# Patient Record
Sex: Male | Born: 1950 | Race: White | Hispanic: No | Marital: Married | Smoking: Never smoker
Health system: Southern US, Community
[De-identification: ages and names within clinical notes are randomized; demographics above are authoritative.]

## PROBLEM LIST (undated history)

## (undated) DIAGNOSIS — G4733 Obstructive sleep apnea (adult) (pediatric): Secondary | ICD-10-CM

## (undated) DIAGNOSIS — I4891 Unspecified atrial fibrillation: Secondary | ICD-10-CM

## (undated) DIAGNOSIS — I4892 Unspecified atrial flutter: Secondary | ICD-10-CM

## (undated) DIAGNOSIS — I499 Cardiac arrhythmia, unspecified: Secondary | ICD-10-CM

## (undated) DIAGNOSIS — I509 Heart failure, unspecified: Secondary | ICD-10-CM

## (undated) DIAGNOSIS — Z9989 Dependence on other enabling machines and devices: Secondary | ICD-10-CM

## (undated) DIAGNOSIS — E785 Hyperlipidemia, unspecified: Secondary | ICD-10-CM

## (undated) DIAGNOSIS — I10 Essential (primary) hypertension: Secondary | ICD-10-CM

## (undated) DIAGNOSIS — J309 Allergic rhinitis, unspecified: Secondary | ICD-10-CM

## (undated) DIAGNOSIS — E119 Type 2 diabetes mellitus without complications: Secondary | ICD-10-CM

## (undated) HISTORY — DX: Cardiac arrhythmia, unspecified: I49.9

## (undated) HISTORY — DX: Heart failure, unspecified: I50.9

## (undated) HISTORY — DX: Unspecified atrial flutter: I48.92

## (undated) HISTORY — DX: Unspecified atrial fibrillation: I48.91

## (undated) HISTORY — DX: Essential (primary) hypertension: I10

## (undated) HISTORY — DX: Type 2 diabetes mellitus without complications: E11.9

## (undated) HISTORY — DX: Allergic rhinitis, unspecified: J30.9

## (undated) HISTORY — DX: Hyperlipidemia, unspecified: E78.5

## (undated) HISTORY — PX: CARDIAC CATHETERIZATION: SHX172

## (undated) HISTORY — DX: Obstructive sleep apnea (adult) (pediatric): G47.33

## (undated) HISTORY — PX: KNEE SURGERY: SHX244

## (undated) HISTORY — PX: PILONIDAL CYST / SINUS EXCISION: SUR543

---

## 2014-11-02 DIAGNOSIS — R5383 Other fatigue: Secondary | ICD-10-CM | POA: Diagnosis not present

## 2014-11-02 DIAGNOSIS — R5381 Other malaise: Secondary | ICD-10-CM | POA: Diagnosis not present

## 2014-11-02 DIAGNOSIS — D649 Anemia, unspecified: Secondary | ICD-10-CM | POA: Diagnosis not present

## 2014-11-02 DIAGNOSIS — E291 Testicular hypofunction: Secondary | ICD-10-CM | POA: Diagnosis not present

## 2014-11-09 DIAGNOSIS — R5381 Other malaise: Secondary | ICD-10-CM | POA: Diagnosis not present

## 2014-11-09 DIAGNOSIS — R030 Elevated blood-pressure reading, without diagnosis of hypertension: Secondary | ICD-10-CM | POA: Diagnosis not present

## 2014-11-09 DIAGNOSIS — E291 Testicular hypofunction: Secondary | ICD-10-CM | POA: Diagnosis not present

## 2014-11-09 DIAGNOSIS — R5383 Other fatigue: Secondary | ICD-10-CM | POA: Diagnosis not present

## 2014-11-16 DIAGNOSIS — E291 Testicular hypofunction: Secondary | ICD-10-CM | POA: Diagnosis not present

## 2014-11-16 DIAGNOSIS — R5381 Other malaise: Secondary | ICD-10-CM | POA: Diagnosis not present

## 2014-11-16 DIAGNOSIS — R5383 Other fatigue: Secondary | ICD-10-CM | POA: Diagnosis not present

## 2014-11-23 DIAGNOSIS — N529 Male erectile dysfunction, unspecified: Secondary | ICD-10-CM | POA: Diagnosis not present

## 2014-11-23 DIAGNOSIS — R5383 Other fatigue: Secondary | ICD-10-CM | POA: Diagnosis not present

## 2014-11-23 DIAGNOSIS — E291 Testicular hypofunction: Secondary | ICD-10-CM | POA: Diagnosis not present

## 2014-11-23 DIAGNOSIS — G4733 Obstructive sleep apnea (adult) (pediatric): Secondary | ICD-10-CM | POA: Diagnosis not present

## 2014-11-23 DIAGNOSIS — R5381 Other malaise: Secondary | ICD-10-CM | POA: Diagnosis not present

## 2014-11-23 DIAGNOSIS — R6882 Decreased libido: Secondary | ICD-10-CM | POA: Diagnosis not present

## 2014-11-23 DIAGNOSIS — D649 Anemia, unspecified: Secondary | ICD-10-CM | POA: Diagnosis not present

## 2014-11-23 DIAGNOSIS — I10 Essential (primary) hypertension: Secondary | ICD-10-CM | POA: Diagnosis not present

## 2014-11-23 DIAGNOSIS — E611 Iron deficiency: Secondary | ICD-10-CM | POA: Diagnosis not present

## 2014-11-30 DIAGNOSIS — Z79899 Other long term (current) drug therapy: Secondary | ICD-10-CM | POA: Diagnosis not present

## 2014-11-30 DIAGNOSIS — N429 Disorder of prostate, unspecified: Secondary | ICD-10-CM | POA: Diagnosis not present

## 2014-11-30 DIAGNOSIS — R5381 Other malaise: Secondary | ICD-10-CM | POA: Diagnosis not present

## 2014-11-30 DIAGNOSIS — G4733 Obstructive sleep apnea (adult) (pediatric): Secondary | ICD-10-CM | POA: Diagnosis not present

## 2014-11-30 DIAGNOSIS — E611 Iron deficiency: Secondary | ICD-10-CM | POA: Diagnosis not present

## 2014-11-30 DIAGNOSIS — R5383 Other fatigue: Secondary | ICD-10-CM | POA: Diagnosis not present

## 2014-11-30 DIAGNOSIS — E291 Testicular hypofunction: Secondary | ICD-10-CM | POA: Diagnosis not present

## 2014-12-07 DIAGNOSIS — N429 Disorder of prostate, unspecified: Secondary | ICD-10-CM | POA: Diagnosis not present

## 2014-12-07 DIAGNOSIS — G4733 Obstructive sleep apnea (adult) (pediatric): Secondary | ICD-10-CM | POA: Diagnosis not present

## 2014-12-07 DIAGNOSIS — E291 Testicular hypofunction: Secondary | ICD-10-CM | POA: Diagnosis not present

## 2014-12-07 DIAGNOSIS — R947 Abnormal results of other endocrine function studies: Secondary | ICD-10-CM | POA: Diagnosis not present

## 2014-12-07 DIAGNOSIS — R5383 Other fatigue: Secondary | ICD-10-CM | POA: Diagnosis not present

## 2014-12-07 DIAGNOSIS — N529 Male erectile dysfunction, unspecified: Secondary | ICD-10-CM | POA: Diagnosis not present

## 2014-12-07 DIAGNOSIS — R5381 Other malaise: Secondary | ICD-10-CM | POA: Diagnosis not present

## 2014-12-14 DIAGNOSIS — R5381 Other malaise: Secondary | ICD-10-CM | POA: Diagnosis not present

## 2014-12-14 DIAGNOSIS — G2581 Restless legs syndrome: Secondary | ICD-10-CM | POA: Diagnosis not present

## 2014-12-14 DIAGNOSIS — G4700 Insomnia, unspecified: Secondary | ICD-10-CM | POA: Diagnosis not present

## 2014-12-14 DIAGNOSIS — E291 Testicular hypofunction: Secondary | ICD-10-CM | POA: Diagnosis not present

## 2014-12-14 DIAGNOSIS — G4733 Obstructive sleep apnea (adult) (pediatric): Secondary | ICD-10-CM | POA: Diagnosis not present

## 2014-12-14 DIAGNOSIS — R947 Abnormal results of other endocrine function studies: Secondary | ICD-10-CM | POA: Diagnosis not present

## 2014-12-14 DIAGNOSIS — R5383 Other fatigue: Secondary | ICD-10-CM | POA: Diagnosis not present

## 2014-12-14 DIAGNOSIS — I10 Essential (primary) hypertension: Secondary | ICD-10-CM | POA: Diagnosis not present

## 2014-12-22 DIAGNOSIS — R5381 Other malaise: Secondary | ICD-10-CM | POA: Diagnosis not present

## 2014-12-22 DIAGNOSIS — R5383 Other fatigue: Secondary | ICD-10-CM | POA: Diagnosis not present

## 2014-12-22 DIAGNOSIS — I10 Essential (primary) hypertension: Secondary | ICD-10-CM | POA: Diagnosis not present

## 2014-12-22 DIAGNOSIS — R947 Abnormal results of other endocrine function studies: Secondary | ICD-10-CM | POA: Diagnosis not present

## 2014-12-22 DIAGNOSIS — E039 Hypothyroidism, unspecified: Secondary | ICD-10-CM | POA: Diagnosis not present

## 2014-12-22 DIAGNOSIS — E291 Testicular hypofunction: Secondary | ICD-10-CM | POA: Diagnosis not present

## 2014-12-29 DIAGNOSIS — I10 Essential (primary) hypertension: Secondary | ICD-10-CM | POA: Diagnosis not present

## 2014-12-29 DIAGNOSIS — G4733 Obstructive sleep apnea (adult) (pediatric): Secondary | ICD-10-CM | POA: Diagnosis not present

## 2014-12-29 DIAGNOSIS — R947 Abnormal results of other endocrine function studies: Secondary | ICD-10-CM | POA: Diagnosis not present

## 2014-12-29 DIAGNOSIS — E291 Testicular hypofunction: Secondary | ICD-10-CM | POA: Diagnosis not present

## 2014-12-29 DIAGNOSIS — R5381 Other malaise: Secondary | ICD-10-CM | POA: Diagnosis not present

## 2015-01-05 DIAGNOSIS — E291 Testicular hypofunction: Secondary | ICD-10-CM | POA: Diagnosis not present

## 2015-01-05 DIAGNOSIS — R5381 Other malaise: Secondary | ICD-10-CM | POA: Diagnosis not present

## 2015-01-05 DIAGNOSIS — R947 Abnormal results of other endocrine function studies: Secondary | ICD-10-CM | POA: Diagnosis not present

## 2015-01-12 DIAGNOSIS — R5381 Other malaise: Secondary | ICD-10-CM | POA: Diagnosis not present

## 2015-01-12 DIAGNOSIS — E291 Testicular hypofunction: Secondary | ICD-10-CM | POA: Diagnosis not present

## 2015-01-12 DIAGNOSIS — R947 Abnormal results of other endocrine function studies: Secondary | ICD-10-CM | POA: Diagnosis not present

## 2015-01-12 DIAGNOSIS — G4733 Obstructive sleep apnea (adult) (pediatric): Secondary | ICD-10-CM | POA: Diagnosis not present

## 2015-01-26 DIAGNOSIS — R947 Abnormal results of other endocrine function studies: Secondary | ICD-10-CM | POA: Diagnosis not present

## 2015-01-26 DIAGNOSIS — E291 Testicular hypofunction: Secondary | ICD-10-CM | POA: Diagnosis not present

## 2015-01-26 DIAGNOSIS — R5381 Other malaise: Secondary | ICD-10-CM | POA: Diagnosis not present

## 2015-02-02 DIAGNOSIS — R5381 Other malaise: Secondary | ICD-10-CM | POA: Diagnosis not present

## 2015-02-02 DIAGNOSIS — E291 Testicular hypofunction: Secondary | ICD-10-CM | POA: Diagnosis not present

## 2015-02-02 DIAGNOSIS — R947 Abnormal results of other endocrine function studies: Secondary | ICD-10-CM | POA: Diagnosis not present

## 2015-02-02 DIAGNOSIS — R5383 Other fatigue: Secondary | ICD-10-CM | POA: Diagnosis not present

## 2015-02-09 DIAGNOSIS — R5383 Other fatigue: Secondary | ICD-10-CM | POA: Diagnosis not present

## 2015-02-09 DIAGNOSIS — R947 Abnormal results of other endocrine function studies: Secondary | ICD-10-CM | POA: Diagnosis not present

## 2015-02-09 DIAGNOSIS — R5381 Other malaise: Secondary | ICD-10-CM | POA: Diagnosis not present

## 2015-02-09 DIAGNOSIS — E291 Testicular hypofunction: Secondary | ICD-10-CM | POA: Diagnosis not present

## 2015-02-15 DIAGNOSIS — J309 Allergic rhinitis, unspecified: Secondary | ICD-10-CM | POA: Diagnosis not present

## 2015-02-15 DIAGNOSIS — J019 Acute sinusitis, unspecified: Secondary | ICD-10-CM | POA: Diagnosis not present

## 2015-02-16 DIAGNOSIS — R5383 Other fatigue: Secondary | ICD-10-CM | POA: Diagnosis not present

## 2015-02-16 DIAGNOSIS — E291 Testicular hypofunction: Secondary | ICD-10-CM | POA: Diagnosis not present

## 2015-02-16 DIAGNOSIS — R5381 Other malaise: Secondary | ICD-10-CM | POA: Diagnosis not present

## 2015-02-16 DIAGNOSIS — R947 Abnormal results of other endocrine function studies: Secondary | ICD-10-CM | POA: Diagnosis not present

## 2015-02-21 DIAGNOSIS — J019 Acute sinusitis, unspecified: Secondary | ICD-10-CM | POA: Diagnosis not present

## 2015-03-02 DIAGNOSIS — R947 Abnormal results of other endocrine function studies: Secondary | ICD-10-CM | POA: Diagnosis not present

## 2015-03-02 DIAGNOSIS — R5383 Other fatigue: Secondary | ICD-10-CM | POA: Diagnosis not present

## 2015-03-02 DIAGNOSIS — E291 Testicular hypofunction: Secondary | ICD-10-CM | POA: Diagnosis not present

## 2015-03-02 DIAGNOSIS — R5381 Other malaise: Secondary | ICD-10-CM | POA: Diagnosis not present

## 2015-03-03 DIAGNOSIS — Z8719 Personal history of other diseases of the digestive system: Secondary | ICD-10-CM | POA: Diagnosis not present

## 2015-03-03 DIAGNOSIS — K210 Gastro-esophageal reflux disease with esophagitis: Secondary | ICD-10-CM | POA: Diagnosis not present

## 2015-03-09 DIAGNOSIS — R5381 Other malaise: Secondary | ICD-10-CM | POA: Diagnosis not present

## 2015-03-09 DIAGNOSIS — R947 Abnormal results of other endocrine function studies: Secondary | ICD-10-CM | POA: Diagnosis not present

## 2015-03-09 DIAGNOSIS — R361 Hematospermia: Secondary | ICD-10-CM | POA: Diagnosis not present

## 2015-03-09 DIAGNOSIS — R5383 Other fatigue: Secondary | ICD-10-CM | POA: Diagnosis not present

## 2015-03-09 DIAGNOSIS — E291 Testicular hypofunction: Secondary | ICD-10-CM | POA: Diagnosis not present

## 2015-03-16 DIAGNOSIS — Z79899 Other long term (current) drug therapy: Secondary | ICD-10-CM | POA: Diagnosis not present

## 2015-03-16 DIAGNOSIS — R5381 Other malaise: Secondary | ICD-10-CM | POA: Diagnosis not present

## 2015-03-16 DIAGNOSIS — R5383 Other fatigue: Secondary | ICD-10-CM | POA: Diagnosis not present

## 2015-03-16 DIAGNOSIS — E291 Testicular hypofunction: Secondary | ICD-10-CM | POA: Diagnosis not present

## 2015-03-16 DIAGNOSIS — R947 Abnormal results of other endocrine function studies: Secondary | ICD-10-CM | POA: Diagnosis not present

## 2015-03-23 DIAGNOSIS — R5383 Other fatigue: Secondary | ICD-10-CM | POA: Diagnosis not present

## 2015-03-23 DIAGNOSIS — R947 Abnormal results of other endocrine function studies: Secondary | ICD-10-CM | POA: Diagnosis not present

## 2015-03-23 DIAGNOSIS — R5381 Other malaise: Secondary | ICD-10-CM | POA: Diagnosis not present

## 2015-03-23 DIAGNOSIS — R361 Hematospermia: Secondary | ICD-10-CM | POA: Diagnosis not present

## 2015-03-23 DIAGNOSIS — E291 Testicular hypofunction: Secondary | ICD-10-CM | POA: Diagnosis not present

## 2015-03-25 DIAGNOSIS — N528 Other male erectile dysfunction: Secondary | ICD-10-CM | POA: Diagnosis not present

## 2015-03-25 DIAGNOSIS — E291 Testicular hypofunction: Secondary | ICD-10-CM | POA: Diagnosis not present

## 2015-03-25 DIAGNOSIS — N401 Enlarged prostate with lower urinary tract symptoms: Secondary | ICD-10-CM | POA: Diagnosis not present

## 2015-03-25 DIAGNOSIS — R361 Hematospermia: Secondary | ICD-10-CM | POA: Diagnosis not present

## 2015-03-30 DIAGNOSIS — R947 Abnormal results of other endocrine function studies: Secondary | ICD-10-CM | POA: Diagnosis not present

## 2015-03-30 DIAGNOSIS — I10 Essential (primary) hypertension: Secondary | ICD-10-CM | POA: Diagnosis not present

## 2015-03-30 DIAGNOSIS — R5381 Other malaise: Secondary | ICD-10-CM | POA: Diagnosis not present

## 2015-03-30 DIAGNOSIS — E291 Testicular hypofunction: Secondary | ICD-10-CM | POA: Diagnosis not present

## 2015-03-30 DIAGNOSIS — R5383 Other fatigue: Secondary | ICD-10-CM | POA: Diagnosis not present

## 2015-04-06 DIAGNOSIS — R5381 Other malaise: Secondary | ICD-10-CM | POA: Diagnosis not present

## 2015-04-06 DIAGNOSIS — R947 Abnormal results of other endocrine function studies: Secondary | ICD-10-CM | POA: Diagnosis not present

## 2015-04-06 DIAGNOSIS — R5383 Other fatigue: Secondary | ICD-10-CM | POA: Diagnosis not present

## 2015-04-06 DIAGNOSIS — E291 Testicular hypofunction: Secondary | ICD-10-CM | POA: Diagnosis not present

## 2015-04-21 DIAGNOSIS — E291 Testicular hypofunction: Secondary | ICD-10-CM | POA: Diagnosis not present

## 2015-04-21 DIAGNOSIS — R947 Abnormal results of other endocrine function studies: Secondary | ICD-10-CM | POA: Diagnosis not present

## 2015-04-21 DIAGNOSIS — I10 Essential (primary) hypertension: Secondary | ICD-10-CM | POA: Diagnosis not present

## 2015-04-21 DIAGNOSIS — R5381 Other malaise: Secondary | ICD-10-CM | POA: Diagnosis not present

## 2015-04-21 DIAGNOSIS — R5383 Other fatigue: Secondary | ICD-10-CM | POA: Diagnosis not present

## 2015-04-27 DIAGNOSIS — I10 Essential (primary) hypertension: Secondary | ICD-10-CM | POA: Diagnosis not present

## 2015-04-27 DIAGNOSIS — M79672 Pain in left foot: Secondary | ICD-10-CM | POA: Diagnosis not present

## 2015-04-27 DIAGNOSIS — E611 Iron deficiency: Secondary | ICD-10-CM | POA: Diagnosis not present

## 2015-04-27 DIAGNOSIS — E291 Testicular hypofunction: Secondary | ICD-10-CM | POA: Diagnosis not present

## 2015-04-27 DIAGNOSIS — R5381 Other malaise: Secondary | ICD-10-CM | POA: Diagnosis not present

## 2015-04-27 DIAGNOSIS — M109 Gout, unspecified: Secondary | ICD-10-CM | POA: Diagnosis not present

## 2015-04-27 DIAGNOSIS — R947 Abnormal results of other endocrine function studies: Secondary | ICD-10-CM | POA: Diagnosis not present

## 2015-04-27 DIAGNOSIS — R5383 Other fatigue: Secondary | ICD-10-CM | POA: Diagnosis not present

## 2015-04-27 DIAGNOSIS — L089 Local infection of the skin and subcutaneous tissue, unspecified: Secondary | ICD-10-CM | POA: Diagnosis not present

## 2015-05-05 DIAGNOSIS — I10 Essential (primary) hypertension: Secondary | ICD-10-CM | POA: Diagnosis not present

## 2015-05-05 DIAGNOSIS — R5381 Other malaise: Secondary | ICD-10-CM | POA: Diagnosis not present

## 2015-05-05 DIAGNOSIS — R5383 Other fatigue: Secondary | ICD-10-CM | POA: Diagnosis not present

## 2015-05-05 DIAGNOSIS — E291 Testicular hypofunction: Secondary | ICD-10-CM | POA: Diagnosis not present

## 2015-05-05 DIAGNOSIS — R947 Abnormal results of other endocrine function studies: Secondary | ICD-10-CM | POA: Diagnosis not present

## 2015-05-10 DIAGNOSIS — J019 Acute sinusitis, unspecified: Secondary | ICD-10-CM | POA: Diagnosis not present

## 2015-05-16 DIAGNOSIS — J209 Acute bronchitis, unspecified: Secondary | ICD-10-CM | POA: Diagnosis not present

## 2015-05-16 DIAGNOSIS — J22 Unspecified acute lower respiratory infection: Secondary | ICD-10-CM | POA: Diagnosis not present

## 2015-05-19 DIAGNOSIS — R5381 Other malaise: Secondary | ICD-10-CM | POA: Diagnosis not present

## 2015-05-19 DIAGNOSIS — R5383 Other fatigue: Secondary | ICD-10-CM | POA: Diagnosis not present

## 2015-05-19 DIAGNOSIS — R947 Abnormal results of other endocrine function studies: Secondary | ICD-10-CM | POA: Diagnosis not present

## 2015-05-19 DIAGNOSIS — E291 Testicular hypofunction: Secondary | ICD-10-CM | POA: Diagnosis not present

## 2015-05-24 DIAGNOSIS — E785 Hyperlipidemia, unspecified: Secondary | ICD-10-CM | POA: Diagnosis not present

## 2015-05-24 DIAGNOSIS — R0600 Dyspnea, unspecified: Secondary | ICD-10-CM | POA: Diagnosis not present

## 2015-05-24 DIAGNOSIS — E119 Type 2 diabetes mellitus without complications: Secondary | ICD-10-CM | POA: Diagnosis not present

## 2015-05-24 DIAGNOSIS — E559 Vitamin D deficiency, unspecified: Secondary | ICD-10-CM | POA: Diagnosis not present

## 2015-05-24 DIAGNOSIS — R0902 Hypoxemia: Secondary | ICD-10-CM | POA: Diagnosis not present

## 2015-05-24 DIAGNOSIS — J9811 Atelectasis: Secondary | ICD-10-CM | POA: Diagnosis not present

## 2015-05-26 DIAGNOSIS — R5383 Other fatigue: Secondary | ICD-10-CM | POA: Diagnosis not present

## 2015-05-26 DIAGNOSIS — R5381 Other malaise: Secondary | ICD-10-CM | POA: Diagnosis not present

## 2015-05-26 DIAGNOSIS — R947 Abnormal results of other endocrine function studies: Secondary | ICD-10-CM | POA: Diagnosis not present

## 2015-05-26 DIAGNOSIS — E119 Type 2 diabetes mellitus without complications: Secondary | ICD-10-CM | POA: Diagnosis not present

## 2015-05-26 DIAGNOSIS — E291 Testicular hypofunction: Secondary | ICD-10-CM | POA: Diagnosis not present

## 2015-05-27 DIAGNOSIS — H2513 Age-related nuclear cataract, bilateral: Secondary | ICD-10-CM | POA: Diagnosis not present

## 2015-05-27 DIAGNOSIS — E11329 Type 2 diabetes mellitus with mild nonproliferative diabetic retinopathy without macular edema: Secondary | ICD-10-CM | POA: Diagnosis not present

## 2015-05-27 DIAGNOSIS — Z0100 Encounter for examination of eyes and vision without abnormal findings: Secondary | ICD-10-CM | POA: Diagnosis not present

## 2015-06-02 DIAGNOSIS — R5383 Other fatigue: Secondary | ICD-10-CM | POA: Diagnosis not present

## 2015-06-02 DIAGNOSIS — R5381 Other malaise: Secondary | ICD-10-CM | POA: Diagnosis not present

## 2015-06-02 DIAGNOSIS — R947 Abnormal results of other endocrine function studies: Secondary | ICD-10-CM | POA: Diagnosis not present

## 2015-06-02 DIAGNOSIS — I10 Essential (primary) hypertension: Secondary | ICD-10-CM | POA: Diagnosis not present

## 2015-06-02 DIAGNOSIS — E291 Testicular hypofunction: Secondary | ICD-10-CM | POA: Diagnosis not present

## 2015-06-09 DIAGNOSIS — R947 Abnormal results of other endocrine function studies: Secondary | ICD-10-CM | POA: Diagnosis not present

## 2015-06-09 DIAGNOSIS — R5383 Other fatigue: Secondary | ICD-10-CM | POA: Diagnosis not present

## 2015-06-09 DIAGNOSIS — R5381 Other malaise: Secondary | ICD-10-CM | POA: Diagnosis not present

## 2015-06-09 DIAGNOSIS — E291 Testicular hypofunction: Secondary | ICD-10-CM | POA: Diagnosis not present

## 2015-06-16 DIAGNOSIS — I10 Essential (primary) hypertension: Secondary | ICD-10-CM | POA: Diagnosis not present

## 2015-06-16 DIAGNOSIS — R947 Abnormal results of other endocrine function studies: Secondary | ICD-10-CM | POA: Diagnosis not present

## 2015-06-16 DIAGNOSIS — R5381 Other malaise: Secondary | ICD-10-CM | POA: Diagnosis not present

## 2015-06-16 DIAGNOSIS — E291 Testicular hypofunction: Secondary | ICD-10-CM | POA: Diagnosis not present

## 2015-06-16 DIAGNOSIS — R5383 Other fatigue: Secondary | ICD-10-CM | POA: Diagnosis not present

## 2015-06-23 DIAGNOSIS — E611 Iron deficiency: Secondary | ICD-10-CM | POA: Diagnosis not present

## 2015-06-23 DIAGNOSIS — I10 Essential (primary) hypertension: Secondary | ICD-10-CM | POA: Diagnosis not present

## 2015-06-23 DIAGNOSIS — E291 Testicular hypofunction: Secondary | ICD-10-CM | POA: Diagnosis not present

## 2015-06-23 DIAGNOSIS — R5383 Other fatigue: Secondary | ICD-10-CM | POA: Diagnosis not present

## 2015-06-23 DIAGNOSIS — R5381 Other malaise: Secondary | ICD-10-CM | POA: Diagnosis not present

## 2015-06-23 DIAGNOSIS — G4733 Obstructive sleep apnea (adult) (pediatric): Secondary | ICD-10-CM | POA: Diagnosis not present

## 2015-06-30 DIAGNOSIS — R947 Abnormal results of other endocrine function studies: Secondary | ICD-10-CM | POA: Diagnosis not present

## 2015-06-30 DIAGNOSIS — E291 Testicular hypofunction: Secondary | ICD-10-CM | POA: Diagnosis not present

## 2015-06-30 DIAGNOSIS — R5383 Other fatigue: Secondary | ICD-10-CM | POA: Diagnosis not present

## 2015-06-30 DIAGNOSIS — R5381 Other malaise: Secondary | ICD-10-CM | POA: Diagnosis not present

## 2015-07-07 DIAGNOSIS — R5383 Other fatigue: Secondary | ICD-10-CM | POA: Diagnosis not present

## 2015-07-07 DIAGNOSIS — R947 Abnormal results of other endocrine function studies: Secondary | ICD-10-CM | POA: Diagnosis not present

## 2015-07-07 DIAGNOSIS — R030 Elevated blood-pressure reading, without diagnosis of hypertension: Secondary | ICD-10-CM | POA: Diagnosis not present

## 2015-07-07 DIAGNOSIS — E291 Testicular hypofunction: Secondary | ICD-10-CM | POA: Diagnosis not present

## 2015-07-07 DIAGNOSIS — R5381 Other malaise: Secondary | ICD-10-CM | POA: Diagnosis not present

## 2015-07-07 DIAGNOSIS — Z79899 Other long term (current) drug therapy: Secondary | ICD-10-CM | POA: Diagnosis not present

## 2015-07-14 DIAGNOSIS — R5381 Other malaise: Secondary | ICD-10-CM | POA: Diagnosis not present

## 2015-07-14 DIAGNOSIS — R5383 Other fatigue: Secondary | ICD-10-CM | POA: Diagnosis not present

## 2015-07-14 DIAGNOSIS — E291 Testicular hypofunction: Secondary | ICD-10-CM | POA: Diagnosis not present

## 2015-07-14 DIAGNOSIS — R778 Other specified abnormalities of plasma proteins: Secondary | ICD-10-CM | POA: Diagnosis not present

## 2015-07-14 DIAGNOSIS — R947 Abnormal results of other endocrine function studies: Secondary | ICD-10-CM | POA: Diagnosis not present

## 2015-07-20 DIAGNOSIS — R5383 Other fatigue: Secondary | ICD-10-CM | POA: Diagnosis not present

## 2015-07-20 DIAGNOSIS — E291 Testicular hypofunction: Secondary | ICD-10-CM | POA: Diagnosis not present

## 2015-07-20 DIAGNOSIS — R947 Abnormal results of other endocrine function studies: Secondary | ICD-10-CM | POA: Diagnosis not present

## 2015-07-20 DIAGNOSIS — R5381 Other malaise: Secondary | ICD-10-CM | POA: Diagnosis not present

## 2015-07-27 DIAGNOSIS — R5381 Other malaise: Secondary | ICD-10-CM | POA: Diagnosis not present

## 2015-07-27 DIAGNOSIS — R947 Abnormal results of other endocrine function studies: Secondary | ICD-10-CM | POA: Diagnosis not present

## 2015-07-27 DIAGNOSIS — E291 Testicular hypofunction: Secondary | ICD-10-CM | POA: Diagnosis not present

## 2015-07-27 DIAGNOSIS — R5383 Other fatigue: Secondary | ICD-10-CM | POA: Diagnosis not present

## 2015-08-11 DIAGNOSIS — E291 Testicular hypofunction: Secondary | ICD-10-CM | POA: Diagnosis not present

## 2015-08-11 DIAGNOSIS — R5383 Other fatigue: Secondary | ICD-10-CM | POA: Diagnosis not present

## 2015-08-11 DIAGNOSIS — R5381 Other malaise: Secondary | ICD-10-CM | POA: Diagnosis not present

## 2015-08-11 DIAGNOSIS — R947 Abnormal results of other endocrine function studies: Secondary | ICD-10-CM | POA: Diagnosis not present

## 2015-08-17 DIAGNOSIS — R947 Abnormal results of other endocrine function studies: Secondary | ICD-10-CM | POA: Diagnosis not present

## 2015-08-17 DIAGNOSIS — R5383 Other fatigue: Secondary | ICD-10-CM | POA: Diagnosis not present

## 2015-08-17 DIAGNOSIS — R5381 Other malaise: Secondary | ICD-10-CM | POA: Diagnosis not present

## 2015-08-17 DIAGNOSIS — E291 Testicular hypofunction: Secondary | ICD-10-CM | POA: Diagnosis not present

## 2015-08-22 DIAGNOSIS — Z23 Encounter for immunization: Secondary | ICD-10-CM | POA: Diagnosis not present

## 2015-08-24 DIAGNOSIS — I10 Essential (primary) hypertension: Secondary | ICD-10-CM | POA: Diagnosis not present

## 2015-08-24 DIAGNOSIS — R5381 Other malaise: Secondary | ICD-10-CM | POA: Diagnosis not present

## 2015-08-24 DIAGNOSIS — E291 Testicular hypofunction: Secondary | ICD-10-CM | POA: Diagnosis not present

## 2015-08-24 DIAGNOSIS — R5383 Other fatigue: Secondary | ICD-10-CM | POA: Diagnosis not present

## 2015-08-24 DIAGNOSIS — R947 Abnormal results of other endocrine function studies: Secondary | ICD-10-CM | POA: Diagnosis not present

## 2015-08-31 DIAGNOSIS — R5381 Other malaise: Secondary | ICD-10-CM | POA: Diagnosis not present

## 2015-08-31 DIAGNOSIS — Z79899 Other long term (current) drug therapy: Secondary | ICD-10-CM | POA: Diagnosis not present

## 2015-08-31 DIAGNOSIS — E291 Testicular hypofunction: Secondary | ICD-10-CM | POA: Diagnosis not present

## 2015-08-31 DIAGNOSIS — I10 Essential (primary) hypertension: Secondary | ICD-10-CM | POA: Diagnosis not present

## 2015-08-31 DIAGNOSIS — E119 Type 2 diabetes mellitus without complications: Secondary | ICD-10-CM | POA: Diagnosis not present

## 2015-08-31 DIAGNOSIS — R947 Abnormal results of other endocrine function studies: Secondary | ICD-10-CM | POA: Diagnosis not present

## 2015-08-31 DIAGNOSIS — R5383 Other fatigue: Secondary | ICD-10-CM | POA: Diagnosis not present

## 2015-09-01 DIAGNOSIS — G4700 Insomnia, unspecified: Secondary | ICD-10-CM | POA: Diagnosis not present

## 2015-09-01 DIAGNOSIS — G2581 Restless legs syndrome: Secondary | ICD-10-CM | POA: Diagnosis not present

## 2015-09-01 DIAGNOSIS — G4733 Obstructive sleep apnea (adult) (pediatric): Secondary | ICD-10-CM | POA: Diagnosis not present

## 2015-09-01 DIAGNOSIS — I10 Essential (primary) hypertension: Secondary | ICD-10-CM | POA: Diagnosis not present

## 2015-09-07 DIAGNOSIS — R947 Abnormal results of other endocrine function studies: Secondary | ICD-10-CM | POA: Diagnosis not present

## 2015-09-07 DIAGNOSIS — R5381 Other malaise: Secondary | ICD-10-CM | POA: Diagnosis not present

## 2015-09-07 DIAGNOSIS — E291 Testicular hypofunction: Secondary | ICD-10-CM | POA: Diagnosis not present

## 2015-09-07 DIAGNOSIS — R5383 Other fatigue: Secondary | ICD-10-CM | POA: Diagnosis not present

## 2015-09-12 DIAGNOSIS — R0600 Dyspnea, unspecified: Secondary | ICD-10-CM | POA: Diagnosis not present

## 2015-09-12 DIAGNOSIS — R0602 Shortness of breath: Secondary | ICD-10-CM | POA: Diagnosis not present

## 2015-09-14 DIAGNOSIS — R5383 Other fatigue: Secondary | ICD-10-CM | POA: Diagnosis not present

## 2015-09-14 DIAGNOSIS — E119 Type 2 diabetes mellitus without complications: Secondary | ICD-10-CM | POA: Diagnosis not present

## 2015-09-14 DIAGNOSIS — R947 Abnormal results of other endocrine function studies: Secondary | ICD-10-CM | POA: Diagnosis not present

## 2015-09-14 DIAGNOSIS — R5381 Other malaise: Secondary | ICD-10-CM | POA: Diagnosis not present

## 2015-09-14 DIAGNOSIS — E291 Testicular hypofunction: Secondary | ICD-10-CM | POA: Diagnosis not present

## 2015-09-28 DIAGNOSIS — R5381 Other malaise: Secondary | ICD-10-CM | POA: Diagnosis not present

## 2015-09-28 DIAGNOSIS — R947 Abnormal results of other endocrine function studies: Secondary | ICD-10-CM | POA: Diagnosis not present

## 2015-09-28 DIAGNOSIS — R5383 Other fatigue: Secondary | ICD-10-CM | POA: Diagnosis not present

## 2015-09-28 DIAGNOSIS — E291 Testicular hypofunction: Secondary | ICD-10-CM | POA: Diagnosis not present

## 2015-09-30 DIAGNOSIS — I10 Essential (primary) hypertension: Secondary | ICD-10-CM | POA: Diagnosis not present

## 2015-09-30 DIAGNOSIS — R600 Localized edema: Secondary | ICD-10-CM | POA: Diagnosis not present

## 2015-09-30 DIAGNOSIS — M25571 Pain in right ankle and joints of right foot: Secondary | ICD-10-CM | POA: Diagnosis not present

## 2015-09-30 DIAGNOSIS — R609 Edema, unspecified: Secondary | ICD-10-CM | POA: Diagnosis not present

## 2015-10-05 DIAGNOSIS — E291 Testicular hypofunction: Secondary | ICD-10-CM | POA: Diagnosis not present

## 2015-10-05 DIAGNOSIS — R5383 Other fatigue: Secondary | ICD-10-CM | POA: Diagnosis not present

## 2015-10-05 DIAGNOSIS — R947 Abnormal results of other endocrine function studies: Secondary | ICD-10-CM | POA: Diagnosis not present

## 2015-10-05 DIAGNOSIS — R5381 Other malaise: Secondary | ICD-10-CM | POA: Diagnosis not present

## 2015-10-07 DIAGNOSIS — R600 Localized edema: Secondary | ICD-10-CM | POA: Diagnosis not present

## 2015-10-07 DIAGNOSIS — I10 Essential (primary) hypertension: Secondary | ICD-10-CM | POA: Diagnosis not present

## 2015-10-14 DIAGNOSIS — E291 Testicular hypofunction: Secondary | ICD-10-CM | POA: Diagnosis not present

## 2015-10-14 DIAGNOSIS — R5383 Other fatigue: Secondary | ICD-10-CM | POA: Diagnosis not present

## 2015-10-14 DIAGNOSIS — Z79899 Other long term (current) drug therapy: Secondary | ICD-10-CM | POA: Diagnosis not present

## 2015-10-14 DIAGNOSIS — R947 Abnormal results of other endocrine function studies: Secondary | ICD-10-CM | POA: Diagnosis not present

## 2015-10-14 DIAGNOSIS — R030 Elevated blood-pressure reading, without diagnosis of hypertension: Secondary | ICD-10-CM | POA: Diagnosis not present

## 2015-10-14 DIAGNOSIS — I10 Essential (primary) hypertension: Secondary | ICD-10-CM | POA: Diagnosis not present

## 2015-10-14 DIAGNOSIS — R5381 Other malaise: Secondary | ICD-10-CM | POA: Diagnosis not present

## 2015-10-14 DIAGNOSIS — N429 Disorder of prostate, unspecified: Secondary | ICD-10-CM | POA: Diagnosis not present

## 2015-10-21 DIAGNOSIS — G4733 Obstructive sleep apnea (adult) (pediatric): Secondary | ICD-10-CM | POA: Diagnosis not present

## 2015-10-21 DIAGNOSIS — N429 Disorder of prostate, unspecified: Secondary | ICD-10-CM | POA: Diagnosis not present

## 2015-10-21 DIAGNOSIS — I10 Essential (primary) hypertension: Secondary | ICD-10-CM | POA: Diagnosis not present

## 2015-10-21 DIAGNOSIS — R5381 Other malaise: Secondary | ICD-10-CM | POA: Diagnosis not present

## 2015-10-21 DIAGNOSIS — E669 Obesity, unspecified: Secondary | ICD-10-CM | POA: Diagnosis not present

## 2015-10-21 DIAGNOSIS — E291 Testicular hypofunction: Secondary | ICD-10-CM | POA: Diagnosis not present

## 2015-10-21 DIAGNOSIS — E119 Type 2 diabetes mellitus without complications: Secondary | ICD-10-CM | POA: Diagnosis not present

## 2015-10-21 DIAGNOSIS — R947 Abnormal results of other endocrine function studies: Secondary | ICD-10-CM | POA: Diagnosis not present

## 2015-10-21 DIAGNOSIS — R778 Other specified abnormalities of plasma proteins: Secondary | ICD-10-CM | POA: Diagnosis not present

## 2015-10-21 DIAGNOSIS — R5383 Other fatigue: Secondary | ICD-10-CM | POA: Diagnosis not present

## 2015-10-27 DIAGNOSIS — R947 Abnormal results of other endocrine function studies: Secondary | ICD-10-CM | POA: Diagnosis not present

## 2015-10-27 DIAGNOSIS — E291 Testicular hypofunction: Secondary | ICD-10-CM | POA: Diagnosis not present

## 2015-10-27 DIAGNOSIS — R5383 Other fatigue: Secondary | ICD-10-CM | POA: Diagnosis not present

## 2015-10-27 DIAGNOSIS — R5381 Other malaise: Secondary | ICD-10-CM | POA: Diagnosis not present

## 2015-11-26 DIAGNOSIS — K591 Functional diarrhea: Secondary | ICD-10-CM | POA: Diagnosis not present

## 2015-12-01 DIAGNOSIS — R42 Dizziness and giddiness: Secondary | ICD-10-CM | POA: Diagnosis not present

## 2015-12-01 DIAGNOSIS — R4182 Altered mental status, unspecified: Secondary | ICD-10-CM | POA: Diagnosis not present

## 2015-12-28 DIAGNOSIS — E291 Testicular hypofunction: Secondary | ICD-10-CM | POA: Diagnosis not present

## 2015-12-28 DIAGNOSIS — K2270 Barrett's esophagus without dysplasia: Secondary | ICD-10-CM | POA: Diagnosis not present

## 2016-01-04 DIAGNOSIS — L989 Disorder of the skin and subcutaneous tissue, unspecified: Secondary | ICD-10-CM | POA: Diagnosis not present

## 2016-01-04 DIAGNOSIS — E291 Testicular hypofunction: Secondary | ICD-10-CM | POA: Diagnosis not present

## 2016-01-05 DIAGNOSIS — K219 Gastro-esophageal reflux disease without esophagitis: Secondary | ICD-10-CM | POA: Diagnosis not present

## 2016-01-05 DIAGNOSIS — L578 Other skin changes due to chronic exposure to nonionizing radiation: Secondary | ICD-10-CM | POA: Diagnosis not present

## 2016-01-05 DIAGNOSIS — L570 Actinic keratosis: Secondary | ICD-10-CM | POA: Diagnosis not present

## 2016-01-11 DIAGNOSIS — R5383 Other fatigue: Secondary | ICD-10-CM | POA: Diagnosis not present

## 2016-01-11 DIAGNOSIS — N5203 Combined arterial insufficiency and corporo-venous occlusive erectile dysfunction: Secondary | ICD-10-CM | POA: Diagnosis not present

## 2016-01-11 DIAGNOSIS — E291 Testicular hypofunction: Secondary | ICD-10-CM | POA: Diagnosis not present

## 2016-01-11 DIAGNOSIS — E1151 Type 2 diabetes mellitus with diabetic peripheral angiopathy without gangrene: Secondary | ICD-10-CM | POA: Diagnosis not present

## 2016-01-11 DIAGNOSIS — N401 Enlarged prostate with lower urinary tract symptoms: Secondary | ICD-10-CM | POA: Diagnosis not present

## 2016-01-11 DIAGNOSIS — N3000 Acute cystitis without hematuria: Secondary | ICD-10-CM | POA: Diagnosis not present

## 2016-01-18 DIAGNOSIS — D0421 Carcinoma in situ of skin of right ear and external auricular canal: Secondary | ICD-10-CM | POA: Diagnosis not present

## 2016-01-18 DIAGNOSIS — E291 Testicular hypofunction: Secondary | ICD-10-CM | POA: Diagnosis not present

## 2016-01-27 DIAGNOSIS — D131 Benign neoplasm of stomach: Secondary | ICD-10-CM | POA: Diagnosis not present

## 2016-01-27 DIAGNOSIS — Z79899 Other long term (current) drug therapy: Secondary | ICD-10-CM | POA: Diagnosis not present

## 2016-01-27 DIAGNOSIS — K449 Diaphragmatic hernia without obstruction or gangrene: Secondary | ICD-10-CM | POA: Diagnosis not present

## 2016-01-27 DIAGNOSIS — Z800 Family history of malignant neoplasm of digestive organs: Secondary | ICD-10-CM | POA: Diagnosis not present

## 2016-01-27 DIAGNOSIS — J45909 Unspecified asthma, uncomplicated: Secondary | ICD-10-CM | POA: Diagnosis not present

## 2016-01-27 DIAGNOSIS — K2270 Barrett's esophagus without dysplasia: Secondary | ICD-10-CM | POA: Diagnosis not present

## 2016-01-27 DIAGNOSIS — K219 Gastro-esophageal reflux disease without esophagitis: Secondary | ICD-10-CM | POA: Diagnosis not present

## 2016-01-27 DIAGNOSIS — K317 Polyp of stomach and duodenum: Secondary | ICD-10-CM | POA: Diagnosis not present

## 2016-01-27 DIAGNOSIS — Z7984 Long term (current) use of oral hypoglycemic drugs: Secondary | ICD-10-CM | POA: Diagnosis not present

## 2016-01-27 DIAGNOSIS — K2950 Unspecified chronic gastritis without bleeding: Secondary | ICD-10-CM | POA: Diagnosis not present

## 2016-01-27 DIAGNOSIS — K2970 Gastritis, unspecified, without bleeding: Secondary | ICD-10-CM | POA: Diagnosis not present

## 2016-01-27 DIAGNOSIS — I10 Essential (primary) hypertension: Secondary | ICD-10-CM | POA: Diagnosis not present

## 2016-01-27 DIAGNOSIS — E119 Type 2 diabetes mellitus without complications: Secondary | ICD-10-CM | POA: Diagnosis not present

## 2016-02-06 DIAGNOSIS — F515 Nightmare disorder: Secondary | ICD-10-CM | POA: Diagnosis not present

## 2016-02-06 DIAGNOSIS — I10 Essential (primary) hypertension: Secondary | ICD-10-CM | POA: Diagnosis not present

## 2016-02-06 DIAGNOSIS — E119 Type 2 diabetes mellitus without complications: Secondary | ICD-10-CM | POA: Diagnosis not present

## 2016-03-30 DIAGNOSIS — R197 Diarrhea, unspecified: Secondary | ICD-10-CM | POA: Diagnosis not present

## 2016-04-02 DIAGNOSIS — R197 Diarrhea, unspecified: Secondary | ICD-10-CM | POA: Diagnosis not present

## 2016-04-05 DIAGNOSIS — J0100 Acute maxillary sinusitis, unspecified: Secondary | ICD-10-CM | POA: Diagnosis not present

## 2016-04-17 DIAGNOSIS — H52222 Regular astigmatism, left eye: Secondary | ICD-10-CM | POA: Diagnosis not present

## 2016-04-17 DIAGNOSIS — I10 Essential (primary) hypertension: Secondary | ICD-10-CM | POA: Diagnosis not present

## 2016-04-17 DIAGNOSIS — E119 Type 2 diabetes mellitus without complications: Secondary | ICD-10-CM | POA: Diagnosis not present

## 2016-04-17 DIAGNOSIS — H524 Presbyopia: Secondary | ICD-10-CM | POA: Diagnosis not present

## 2016-04-17 DIAGNOSIS — Z7984 Long term (current) use of oral hypoglycemic drugs: Secondary | ICD-10-CM | POA: Diagnosis not present

## 2016-04-17 DIAGNOSIS — H5213 Myopia, bilateral: Secondary | ICD-10-CM | POA: Diagnosis not present

## 2016-04-17 DIAGNOSIS — H35372 Puckering of macula, left eye: Secondary | ICD-10-CM | POA: Diagnosis not present

## 2016-04-17 DIAGNOSIS — H25813 Combined forms of age-related cataract, bilateral: Secondary | ICD-10-CM | POA: Diagnosis not present

## 2016-04-19 DIAGNOSIS — L03011 Cellulitis of right finger: Secondary | ICD-10-CM | POA: Diagnosis not present

## 2016-05-07 DIAGNOSIS — R197 Diarrhea, unspecified: Secondary | ICD-10-CM | POA: Diagnosis not present

## 2016-05-18 DIAGNOSIS — J302 Other seasonal allergic rhinitis: Secondary | ICD-10-CM | POA: Diagnosis not present

## 2016-05-18 DIAGNOSIS — R609 Edema, unspecified: Secondary | ICD-10-CM | POA: Diagnosis not present

## 2016-05-28 DIAGNOSIS — J40 Bronchitis, not specified as acute or chronic: Secondary | ICD-10-CM | POA: Diagnosis not present

## 2016-06-05 DIAGNOSIS — M722 Plantar fascial fibromatosis: Secondary | ICD-10-CM

## 2016-06-05 DIAGNOSIS — E119 Type 2 diabetes mellitus without complications: Secondary | ICD-10-CM | POA: Diagnosis not present

## 2016-06-05 HISTORY — DX: Plantar fascial fibromatosis: M72.2

## 2016-06-25 DIAGNOSIS — Z1389 Encounter for screening for other disorder: Secondary | ICD-10-CM | POA: Diagnosis not present

## 2016-06-25 DIAGNOSIS — I10 Essential (primary) hypertension: Secondary | ICD-10-CM | POA: Diagnosis not present

## 2016-06-25 DIAGNOSIS — E7800 Pure hypercholesterolemia, unspecified: Secondary | ICD-10-CM | POA: Diagnosis not present

## 2016-06-25 DIAGNOSIS — J4520 Mild intermittent asthma, uncomplicated: Secondary | ICD-10-CM | POA: Diagnosis not present

## 2016-06-25 DIAGNOSIS — K2270 Barrett's esophagus without dysplasia: Secondary | ICD-10-CM | POA: Diagnosis not present

## 2016-06-25 DIAGNOSIS — K219 Gastro-esophageal reflux disease without esophagitis: Secondary | ICD-10-CM | POA: Diagnosis not present

## 2016-06-25 DIAGNOSIS — M109 Gout, unspecified: Secondary | ICD-10-CM | POA: Diagnosis not present

## 2016-06-25 DIAGNOSIS — Z23 Encounter for immunization: Secondary | ICD-10-CM | POA: Diagnosis not present

## 2016-06-25 DIAGNOSIS — N400 Enlarged prostate without lower urinary tract symptoms: Secondary | ICD-10-CM | POA: Diagnosis not present

## 2016-06-25 DIAGNOSIS — F419 Anxiety disorder, unspecified: Secondary | ICD-10-CM | POA: Diagnosis not present

## 2016-06-25 DIAGNOSIS — E559 Vitamin D deficiency, unspecified: Secondary | ICD-10-CM | POA: Diagnosis not present

## 2016-06-25 DIAGNOSIS — M150 Primary generalized (osteo)arthritis: Secondary | ICD-10-CM | POA: Diagnosis not present

## 2016-06-25 DIAGNOSIS — E119 Type 2 diabetes mellitus without complications: Secondary | ICD-10-CM | POA: Diagnosis not present

## 2016-07-12 DIAGNOSIS — I6523 Occlusion and stenosis of bilateral carotid arteries: Secondary | ICD-10-CM | POA: Diagnosis not present

## 2016-07-23 DIAGNOSIS — L821 Other seborrheic keratosis: Secondary | ICD-10-CM | POA: Diagnosis not present

## 2016-07-23 DIAGNOSIS — L918 Other hypertrophic disorders of the skin: Secondary | ICD-10-CM | POA: Diagnosis not present

## 2016-07-24 DIAGNOSIS — K589 Irritable bowel syndrome without diarrhea: Secondary | ICD-10-CM | POA: Diagnosis not present

## 2016-09-05 DIAGNOSIS — H1045 Other chronic allergic conjunctivitis: Secondary | ICD-10-CM | POA: Diagnosis not present

## 2016-09-05 DIAGNOSIS — R197 Diarrhea, unspecified: Secondary | ICD-10-CM | POA: Diagnosis not present

## 2016-09-05 DIAGNOSIS — I10 Essential (primary) hypertension: Secondary | ICD-10-CM | POA: Diagnosis not present

## 2016-09-05 DIAGNOSIS — Z77018 Contact with and (suspected) exposure to other hazardous metals: Secondary | ICD-10-CM | POA: Diagnosis not present

## 2016-09-26 DIAGNOSIS — E119 Type 2 diabetes mellitus without complications: Secondary | ICD-10-CM | POA: Diagnosis not present

## 2016-09-26 DIAGNOSIS — E7800 Pure hypercholesterolemia, unspecified: Secondary | ICD-10-CM | POA: Diagnosis not present

## 2016-09-26 DIAGNOSIS — I10 Essential (primary) hypertension: Secondary | ICD-10-CM | POA: Diagnosis not present

## 2016-09-27 DIAGNOSIS — K2270 Barrett's esophagus without dysplasia: Secondary | ICD-10-CM | POA: Diagnosis not present

## 2016-09-27 DIAGNOSIS — K219 Gastro-esophageal reflux disease without esophagitis: Secondary | ICD-10-CM | POA: Diagnosis not present

## 2016-09-27 DIAGNOSIS — K591 Functional diarrhea: Secondary | ICD-10-CM | POA: Diagnosis not present

## 2016-10-18 DIAGNOSIS — D126 Benign neoplasm of colon, unspecified: Secondary | ICD-10-CM | POA: Diagnosis not present

## 2016-10-18 DIAGNOSIS — Z7982 Long term (current) use of aspirin: Secondary | ICD-10-CM | POA: Diagnosis not present

## 2016-10-18 DIAGNOSIS — Z1211 Encounter for screening for malignant neoplasm of colon: Secondary | ICD-10-CM | POA: Diagnosis not present

## 2016-10-18 DIAGNOSIS — K634 Enteroptosis: Secondary | ICD-10-CM | POA: Diagnosis not present

## 2016-10-18 DIAGNOSIS — Z7984 Long term (current) use of oral hypoglycemic drugs: Secondary | ICD-10-CM | POA: Diagnosis not present

## 2016-10-18 DIAGNOSIS — Z86010 Personal history of colonic polyps: Secondary | ICD-10-CM | POA: Diagnosis not present

## 2016-10-18 DIAGNOSIS — K635 Polyp of colon: Secondary | ICD-10-CM | POA: Diagnosis not present

## 2016-10-18 DIAGNOSIS — K5730 Diverticulosis of large intestine without perforation or abscess without bleeding: Secondary | ICD-10-CM | POA: Diagnosis not present

## 2016-10-18 DIAGNOSIS — Z79899 Other long term (current) drug therapy: Secondary | ICD-10-CM | POA: Diagnosis not present

## 2016-11-08 DIAGNOSIS — I10 Essential (primary) hypertension: Secondary | ICD-10-CM | POA: Diagnosis not present

## 2016-11-08 DIAGNOSIS — H25813 Combined forms of age-related cataract, bilateral: Secondary | ICD-10-CM | POA: Diagnosis not present

## 2016-11-18 DIAGNOSIS — L03116 Cellulitis of left lower limb: Secondary | ICD-10-CM | POA: Diagnosis not present

## 2016-11-27 DIAGNOSIS — R600 Localized edema: Secondary | ICD-10-CM | POA: Diagnosis not present

## 2016-11-27 DIAGNOSIS — M79662 Pain in left lower leg: Secondary | ICD-10-CM | POA: Diagnosis not present

## 2016-11-27 DIAGNOSIS — M79605 Pain in left leg: Secondary | ICD-10-CM | POA: Diagnosis not present

## 2016-11-27 DIAGNOSIS — L03119 Cellulitis of unspecified part of limb: Secondary | ICD-10-CM | POA: Diagnosis not present

## 2016-11-27 DIAGNOSIS — I83222 Varicose veins of left lower extremity with both ulcer of calf and inflammation: Secondary | ICD-10-CM | POA: Diagnosis not present

## 2016-11-27 DIAGNOSIS — R2242 Localized swelling, mass and lump, left lower limb: Secondary | ICD-10-CM | POA: Diagnosis not present

## 2016-12-10 DIAGNOSIS — L84 Corns and callosities: Secondary | ICD-10-CM | POA: Diagnosis not present

## 2016-12-10 DIAGNOSIS — L03116 Cellulitis of left lower limb: Secondary | ICD-10-CM | POA: Diagnosis not present

## 2016-12-11 DIAGNOSIS — L602 Onychogryphosis: Secondary | ICD-10-CM

## 2016-12-11 DIAGNOSIS — R234 Changes in skin texture: Secondary | ICD-10-CM

## 2016-12-11 DIAGNOSIS — E119 Type 2 diabetes mellitus without complications: Secondary | ICD-10-CM | POA: Diagnosis not present

## 2016-12-11 HISTORY — DX: Onychogryphosis: L60.2

## 2016-12-11 HISTORY — DX: Changes in skin texture: R23.4

## 2016-12-31 DIAGNOSIS — R609 Edema, unspecified: Secondary | ICD-10-CM | POA: Diagnosis not present

## 2016-12-31 DIAGNOSIS — I10 Essential (primary) hypertension: Secondary | ICD-10-CM | POA: Diagnosis not present

## 2016-12-31 DIAGNOSIS — E1165 Type 2 diabetes mellitus with hyperglycemia: Secondary | ICD-10-CM | POA: Diagnosis not present

## 2017-01-08 DIAGNOSIS — H02839 Dermatochalasis of unspecified eye, unspecified eyelid: Secondary | ICD-10-CM | POA: Diagnosis not present

## 2017-01-08 DIAGNOSIS — E119 Type 2 diabetes mellitus without complications: Secondary | ICD-10-CM | POA: Diagnosis not present

## 2017-01-08 DIAGNOSIS — H2513 Age-related nuclear cataract, bilateral: Secondary | ICD-10-CM | POA: Diagnosis not present

## 2017-01-08 DIAGNOSIS — H18413 Arcus senilis, bilateral: Secondary | ICD-10-CM | POA: Diagnosis not present

## 2017-01-08 DIAGNOSIS — H2511 Age-related nuclear cataract, right eye: Secondary | ICD-10-CM | POA: Diagnosis not present

## 2017-02-13 DIAGNOSIS — K5730 Diverticulosis of large intestine without perforation or abscess without bleeding: Secondary | ICD-10-CM | POA: Diagnosis not present

## 2017-02-13 DIAGNOSIS — D126 Benign neoplasm of colon, unspecified: Secondary | ICD-10-CM | POA: Diagnosis not present

## 2017-02-13 DIAGNOSIS — K2270 Barrett's esophagus without dysplasia: Secondary | ICD-10-CM | POA: Diagnosis not present

## 2017-03-11 DIAGNOSIS — I10 Essential (primary) hypertension: Secondary | ICD-10-CM | POA: Diagnosis not present

## 2017-03-11 DIAGNOSIS — Z961 Presence of intraocular lens: Secondary | ICD-10-CM | POA: Diagnosis not present

## 2017-03-11 DIAGNOSIS — Z9841 Cataract extraction status, right eye: Secondary | ICD-10-CM | POA: Diagnosis not present

## 2017-03-11 DIAGNOSIS — H2511 Age-related nuclear cataract, right eye: Secondary | ICD-10-CM | POA: Diagnosis not present

## 2017-03-12 DIAGNOSIS — H2512 Age-related nuclear cataract, left eye: Secondary | ICD-10-CM | POA: Diagnosis not present

## 2017-03-25 DIAGNOSIS — Z961 Presence of intraocular lens: Secondary | ICD-10-CM | POA: Diagnosis not present

## 2017-03-25 DIAGNOSIS — I10 Essential (primary) hypertension: Secondary | ICD-10-CM | POA: Diagnosis not present

## 2017-03-25 DIAGNOSIS — Z9841 Cataract extraction status, right eye: Secondary | ICD-10-CM | POA: Diagnosis not present

## 2017-03-25 DIAGNOSIS — H2511 Age-related nuclear cataract, right eye: Secondary | ICD-10-CM | POA: Diagnosis not present

## 2017-03-25 DIAGNOSIS — H2512 Age-related nuclear cataract, left eye: Secondary | ICD-10-CM | POA: Diagnosis not present

## 2017-03-25 DIAGNOSIS — Z9842 Cataract extraction status, left eye: Secondary | ICD-10-CM | POA: Diagnosis not present

## 2017-04-12 DIAGNOSIS — E119 Type 2 diabetes mellitus without complications: Secondary | ICD-10-CM | POA: Diagnosis not present

## 2017-07-11 DIAGNOSIS — D1801 Hemangioma of skin and subcutaneous tissue: Secondary | ICD-10-CM | POA: Diagnosis not present

## 2017-07-11 DIAGNOSIS — L720 Epidermal cyst: Secondary | ICD-10-CM | POA: Diagnosis not present

## 2017-07-11 DIAGNOSIS — L821 Other seborrheic keratosis: Secondary | ICD-10-CM | POA: Diagnosis not present

## 2017-07-17 DIAGNOSIS — E119 Type 2 diabetes mellitus without complications: Secondary | ICD-10-CM | POA: Diagnosis not present

## 2017-07-17 DIAGNOSIS — Z23 Encounter for immunization: Secondary | ICD-10-CM | POA: Diagnosis not present

## 2017-12-02 DIAGNOSIS — J0100 Acute maxillary sinusitis, unspecified: Secondary | ICD-10-CM | POA: Diagnosis not present

## 2017-12-23 DIAGNOSIS — R112 Nausea with vomiting, unspecified: Secondary | ICD-10-CM | POA: Diagnosis not present

## 2017-12-23 DIAGNOSIS — R1011 Right upper quadrant pain: Secondary | ICD-10-CM | POA: Diagnosis not present

## 2017-12-23 DIAGNOSIS — R197 Diarrhea, unspecified: Secondary | ICD-10-CM | POA: Diagnosis not present

## 2018-01-02 DIAGNOSIS — R197 Diarrhea, unspecified: Secondary | ICD-10-CM | POA: Diagnosis not present

## 2018-01-02 DIAGNOSIS — R112 Nausea with vomiting, unspecified: Secondary | ICD-10-CM | POA: Diagnosis not present

## 2018-01-02 DIAGNOSIS — R1011 Right upper quadrant pain: Secondary | ICD-10-CM | POA: Diagnosis not present

## 2018-01-08 DIAGNOSIS — G8929 Other chronic pain: Secondary | ICD-10-CM | POA: Diagnosis not present

## 2018-01-08 DIAGNOSIS — M5136 Other intervertebral disc degeneration, lumbar region: Secondary | ICD-10-CM | POA: Diagnosis not present

## 2018-01-08 DIAGNOSIS — K760 Fatty (change of) liver, not elsewhere classified: Secondary | ICD-10-CM | POA: Diagnosis not present

## 2018-01-08 DIAGNOSIS — I700 Atherosclerosis of aorta: Secondary | ICD-10-CM | POA: Diagnosis not present

## 2018-01-08 DIAGNOSIS — R1011 Right upper quadrant pain: Secondary | ICD-10-CM | POA: Diagnosis not present

## 2018-01-08 DIAGNOSIS — K449 Diaphragmatic hernia without obstruction or gangrene: Secondary | ICD-10-CM | POA: Diagnosis not present

## 2018-01-20 DIAGNOSIS — R197 Diarrhea, unspecified: Secondary | ICD-10-CM | POA: Diagnosis not present

## 2018-01-20 DIAGNOSIS — K449 Diaphragmatic hernia without obstruction or gangrene: Secondary | ICD-10-CM | POA: Diagnosis not present

## 2018-01-20 DIAGNOSIS — R1011 Right upper quadrant pain: Secondary | ICD-10-CM | POA: Diagnosis not present

## 2018-01-20 DIAGNOSIS — R112 Nausea with vomiting, unspecified: Secondary | ICD-10-CM | POA: Diagnosis not present

## 2018-01-23 DIAGNOSIS — R112 Nausea with vomiting, unspecified: Secondary | ICD-10-CM | POA: Diagnosis not present

## 2018-01-23 DIAGNOSIS — R1011 Right upper quadrant pain: Secondary | ICD-10-CM | POA: Diagnosis not present

## 2018-01-30 DIAGNOSIS — K449 Diaphragmatic hernia without obstruction or gangrene: Secondary | ICD-10-CM | POA: Diagnosis not present

## 2018-01-30 DIAGNOSIS — F411 Generalized anxiety disorder: Secondary | ICD-10-CM | POA: Diagnosis not present

## 2018-01-30 DIAGNOSIS — R1011 Right upper quadrant pain: Secondary | ICD-10-CM | POA: Diagnosis not present

## 2018-01-30 DIAGNOSIS — E7800 Pure hypercholesterolemia, unspecified: Secondary | ICD-10-CM | POA: Diagnosis not present

## 2018-01-30 DIAGNOSIS — E119 Type 2 diabetes mellitus without complications: Secondary | ICD-10-CM | POA: Diagnosis not present

## 2018-01-30 DIAGNOSIS — K2270 Barrett's esophagus without dysplasia: Secondary | ICD-10-CM | POA: Diagnosis not present

## 2018-01-30 DIAGNOSIS — Z1389 Encounter for screening for other disorder: Secondary | ICD-10-CM | POA: Diagnosis not present

## 2018-01-30 DIAGNOSIS — R197 Diarrhea, unspecified: Secondary | ICD-10-CM | POA: Diagnosis not present

## 2018-01-30 DIAGNOSIS — J4520 Mild intermittent asthma, uncomplicated: Secondary | ICD-10-CM | POA: Diagnosis not present

## 2018-01-30 DIAGNOSIS — K589 Irritable bowel syndrome without diarrhea: Secondary | ICD-10-CM | POA: Diagnosis not present

## 2018-01-30 DIAGNOSIS — M109 Gout, unspecified: Secondary | ICD-10-CM | POA: Diagnosis not present

## 2018-01-30 DIAGNOSIS — I10 Essential (primary) hypertension: Secondary | ICD-10-CM | POA: Diagnosis not present

## 2018-01-30 DIAGNOSIS — Z6837 Body mass index (BMI) 37.0-37.9, adult: Secondary | ICD-10-CM | POA: Diagnosis not present

## 2018-01-30 DIAGNOSIS — N400 Enlarged prostate without lower urinary tract symptoms: Secondary | ICD-10-CM | POA: Diagnosis not present

## 2018-01-30 DIAGNOSIS — I739 Peripheral vascular disease, unspecified: Secondary | ICD-10-CM | POA: Diagnosis not present

## 2018-01-30 DIAGNOSIS — R112 Nausea with vomiting, unspecified: Secondary | ICD-10-CM | POA: Diagnosis not present

## 2018-02-05 DIAGNOSIS — K591 Functional diarrhea: Secondary | ICD-10-CM | POA: Diagnosis not present

## 2018-02-05 DIAGNOSIS — K2270 Barrett's esophagus without dysplasia: Secondary | ICD-10-CM | POA: Diagnosis not present

## 2018-02-05 DIAGNOSIS — K219 Gastro-esophageal reflux disease without esophagitis: Secondary | ICD-10-CM | POA: Diagnosis not present

## 2018-02-21 DIAGNOSIS — Z7982 Long term (current) use of aspirin: Secondary | ICD-10-CM | POA: Diagnosis not present

## 2018-02-21 DIAGNOSIS — K449 Diaphragmatic hernia without obstruction or gangrene: Secondary | ICD-10-CM | POA: Diagnosis not present

## 2018-02-21 DIAGNOSIS — K210 Gastro-esophageal reflux disease with esophagitis: Secondary | ICD-10-CM | POA: Diagnosis not present

## 2018-02-21 DIAGNOSIS — Z79899 Other long term (current) drug therapy: Secondary | ICD-10-CM | POA: Diagnosis not present

## 2018-02-21 DIAGNOSIS — I10 Essential (primary) hypertension: Secondary | ICD-10-CM | POA: Diagnosis not present

## 2018-02-21 DIAGNOSIS — M1990 Unspecified osteoarthritis, unspecified site: Secondary | ICD-10-CM | POA: Diagnosis not present

## 2018-02-21 DIAGNOSIS — E7800 Pure hypercholesterolemia, unspecified: Secondary | ICD-10-CM | POA: Diagnosis not present

## 2018-02-21 DIAGNOSIS — K760 Fatty (change of) liver, not elsewhere classified: Secondary | ICD-10-CM | POA: Diagnosis not present

## 2018-02-21 DIAGNOSIS — J45909 Unspecified asthma, uncomplicated: Secondary | ICD-10-CM | POA: Diagnosis not present

## 2018-02-21 DIAGNOSIS — E119 Type 2 diabetes mellitus without complications: Secondary | ICD-10-CM | POA: Diagnosis not present

## 2018-02-21 DIAGNOSIS — Z7984 Long term (current) use of oral hypoglycemic drugs: Secondary | ICD-10-CM | POA: Diagnosis not present

## 2018-02-21 DIAGNOSIS — K2270 Barrett's esophagus without dysplasia: Secondary | ICD-10-CM | POA: Diagnosis not present

## 2018-02-21 DIAGNOSIS — K219 Gastro-esophageal reflux disease without esophagitis: Secondary | ICD-10-CM | POA: Diagnosis not present

## 2018-03-07 DIAGNOSIS — Z23 Encounter for immunization: Secondary | ICD-10-CM | POA: Diagnosis not present

## 2018-03-07 DIAGNOSIS — E1165 Type 2 diabetes mellitus with hyperglycemia: Secondary | ICD-10-CM | POA: Diagnosis not present

## 2018-03-07 DIAGNOSIS — Z7689 Persons encountering health services in other specified circumstances: Secondary | ICD-10-CM | POA: Diagnosis not present

## 2018-03-07 DIAGNOSIS — Z1331 Encounter for screening for depression: Secondary | ICD-10-CM | POA: Diagnosis not present

## 2018-03-07 DIAGNOSIS — G4733 Obstructive sleep apnea (adult) (pediatric): Secondary | ICD-10-CM | POA: Diagnosis not present

## 2018-03-30 DIAGNOSIS — G4733 Obstructive sleep apnea (adult) (pediatric): Secondary | ICD-10-CM | POA: Diagnosis not present

## 2018-04-07 DIAGNOSIS — H9391 Unspecified disorder of right ear: Secondary | ICD-10-CM | POA: Diagnosis not present

## 2018-04-07 DIAGNOSIS — Z6839 Body mass index (BMI) 39.0-39.9, adult: Secondary | ICD-10-CM | POA: Diagnosis not present

## 2018-04-07 DIAGNOSIS — E1165 Type 2 diabetes mellitus with hyperglycemia: Secondary | ICD-10-CM | POA: Diagnosis not present

## 2018-04-07 DIAGNOSIS — Z1339 Encounter for screening examination for other mental health and behavioral disorders: Secondary | ICD-10-CM | POA: Diagnosis not present

## 2018-04-09 DIAGNOSIS — K219 Gastro-esophageal reflux disease without esophagitis: Secondary | ICD-10-CM | POA: Diagnosis not present

## 2018-04-09 DIAGNOSIS — K2270 Barrett's esophagus without dysplasia: Secondary | ICD-10-CM | POA: Diagnosis not present

## 2018-04-10 DIAGNOSIS — L570 Actinic keratosis: Secondary | ICD-10-CM | POA: Diagnosis not present

## 2018-04-10 DIAGNOSIS — L814 Other melanin hyperpigmentation: Secondary | ICD-10-CM | POA: Diagnosis not present

## 2018-04-10 DIAGNOSIS — L821 Other seborrheic keratosis: Secondary | ICD-10-CM | POA: Diagnosis not present

## 2018-05-14 DIAGNOSIS — H43813 Vitreous degeneration, bilateral: Secondary | ICD-10-CM | POA: Diagnosis not present

## 2018-05-14 DIAGNOSIS — Z7984 Long term (current) use of oral hypoglycemic drugs: Secondary | ICD-10-CM | POA: Diagnosis not present

## 2018-05-14 DIAGNOSIS — Z794 Long term (current) use of insulin: Secondary | ICD-10-CM | POA: Diagnosis not present

## 2018-05-14 DIAGNOSIS — E119 Type 2 diabetes mellitus without complications: Secondary | ICD-10-CM | POA: Diagnosis not present

## 2018-05-14 DIAGNOSIS — R198 Other specified symptoms and signs involving the digestive system and abdomen: Secondary | ICD-10-CM | POA: Diagnosis not present

## 2018-05-14 DIAGNOSIS — H5203 Hypermetropia, bilateral: Secondary | ICD-10-CM | POA: Diagnosis not present

## 2018-05-14 DIAGNOSIS — H26492 Other secondary cataract, left eye: Secondary | ICD-10-CM | POA: Diagnosis not present

## 2018-05-14 DIAGNOSIS — I10 Essential (primary) hypertension: Secondary | ICD-10-CM | POA: Diagnosis not present

## 2018-05-14 DIAGNOSIS — H524 Presbyopia: Secondary | ICD-10-CM | POA: Diagnosis not present

## 2018-05-14 DIAGNOSIS — H52223 Regular astigmatism, bilateral: Secondary | ICD-10-CM | POA: Diagnosis not present

## 2018-05-14 DIAGNOSIS — Z961 Presence of intraocular lens: Secondary | ICD-10-CM | POA: Diagnosis not present

## 2018-05-14 DIAGNOSIS — H26491 Other secondary cataract, right eye: Secondary | ICD-10-CM | POA: Diagnosis not present

## 2018-05-14 DIAGNOSIS — Z6838 Body mass index (BMI) 38.0-38.9, adult: Secondary | ICD-10-CM | POA: Diagnosis not present

## 2018-05-19 DIAGNOSIS — E1165 Type 2 diabetes mellitus with hyperglycemia: Secondary | ICD-10-CM | POA: Diagnosis not present

## 2018-05-19 DIAGNOSIS — R198 Other specified symptoms and signs involving the digestive system and abdomen: Secondary | ICD-10-CM | POA: Diagnosis not present

## 2018-05-19 DIAGNOSIS — Z9181 History of falling: Secondary | ICD-10-CM | POA: Diagnosis not present

## 2018-05-19 DIAGNOSIS — Z1331 Encounter for screening for depression: Secondary | ICD-10-CM | POA: Diagnosis not present

## 2018-05-23 DIAGNOSIS — K7689 Other specified diseases of liver: Secondary | ICD-10-CM | POA: Diagnosis not present

## 2018-05-23 DIAGNOSIS — R198 Other specified symptoms and signs involving the digestive system and abdomen: Secondary | ICD-10-CM | POA: Diagnosis not present

## 2018-05-27 DIAGNOSIS — I10 Essential (primary) hypertension: Secondary | ICD-10-CM | POA: Diagnosis not present

## 2018-05-27 DIAGNOSIS — Q644 Malformation of urachus: Secondary | ICD-10-CM | POA: Diagnosis not present

## 2018-05-27 DIAGNOSIS — E119 Type 2 diabetes mellitus without complications: Secondary | ICD-10-CM | POA: Diagnosis not present

## 2018-05-28 DIAGNOSIS — E559 Vitamin D deficiency, unspecified: Secondary | ICD-10-CM | POA: Diagnosis not present

## 2018-05-28 DIAGNOSIS — E1140 Type 2 diabetes mellitus with diabetic neuropathy, unspecified: Secondary | ICD-10-CM | POA: Diagnosis not present

## 2018-05-28 DIAGNOSIS — Z79899 Other long term (current) drug therapy: Secondary | ICD-10-CM | POA: Diagnosis not present

## 2018-05-28 DIAGNOSIS — E669 Obesity, unspecified: Secondary | ICD-10-CM | POA: Diagnosis not present

## 2018-05-28 DIAGNOSIS — Z1339 Encounter for screening examination for other mental health and behavioral disorders: Secondary | ICD-10-CM | POA: Diagnosis not present

## 2018-05-28 DIAGNOSIS — I10 Essential (primary) hypertension: Secondary | ICD-10-CM | POA: Diagnosis not present

## 2018-05-28 DIAGNOSIS — Z6838 Body mass index (BMI) 38.0-38.9, adult: Secondary | ICD-10-CM | POA: Diagnosis not present

## 2018-05-30 DIAGNOSIS — N281 Cyst of kidney, acquired: Secondary | ICD-10-CM | POA: Diagnosis not present

## 2018-05-30 DIAGNOSIS — Q644 Malformation of urachus: Secondary | ICD-10-CM | POA: Diagnosis not present

## 2018-06-03 DIAGNOSIS — Q644 Malformation of urachus: Secondary | ICD-10-CM | POA: Diagnosis not present

## 2018-06-03 DIAGNOSIS — E119 Type 2 diabetes mellitus without complications: Secondary | ICD-10-CM | POA: Diagnosis not present

## 2018-06-03 DIAGNOSIS — I10 Essential (primary) hypertension: Secondary | ICD-10-CM | POA: Diagnosis not present

## 2018-07-07 DIAGNOSIS — E119 Type 2 diabetes mellitus without complications: Secondary | ICD-10-CM | POA: Diagnosis not present

## 2018-07-07 DIAGNOSIS — E669 Obesity, unspecified: Secondary | ICD-10-CM | POA: Diagnosis not present

## 2018-07-07 DIAGNOSIS — I10 Essential (primary) hypertension: Secondary | ICD-10-CM | POA: Diagnosis not present

## 2018-07-07 DIAGNOSIS — Z6838 Body mass index (BMI) 38.0-38.9, adult: Secondary | ICD-10-CM | POA: Diagnosis not present

## 2018-07-08 DIAGNOSIS — R945 Abnormal results of liver function studies: Secondary | ICD-10-CM | POA: Diagnosis not present

## 2018-07-29 DIAGNOSIS — E119 Type 2 diabetes mellitus without complications: Secondary | ICD-10-CM | POA: Diagnosis not present

## 2018-07-29 DIAGNOSIS — L02212 Cutaneous abscess of back [any part, except buttock]: Secondary | ICD-10-CM | POA: Diagnosis not present

## 2018-07-29 DIAGNOSIS — R600 Localized edema: Secondary | ICD-10-CM | POA: Diagnosis not present

## 2018-07-29 DIAGNOSIS — Z6838 Body mass index (BMI) 38.0-38.9, adult: Secondary | ICD-10-CM | POA: Diagnosis not present

## 2018-07-30 DIAGNOSIS — L02212 Cutaneous abscess of back [any part, except buttock]: Secondary | ICD-10-CM | POA: Diagnosis not present

## 2018-07-31 DIAGNOSIS — L02212 Cutaneous abscess of back [any part, except buttock]: Secondary | ICD-10-CM | POA: Diagnosis not present

## 2018-08-01 DIAGNOSIS — L02212 Cutaneous abscess of back [any part, except buttock]: Secondary | ICD-10-CM | POA: Diagnosis not present

## 2018-08-02 DIAGNOSIS — L02212 Cutaneous abscess of back [any part, except buttock]: Secondary | ICD-10-CM | POA: Diagnosis not present

## 2018-08-04 DIAGNOSIS — L02212 Cutaneous abscess of back [any part, except buttock]: Secondary | ICD-10-CM | POA: Diagnosis not present

## 2018-08-04 DIAGNOSIS — E669 Obesity, unspecified: Secondary | ICD-10-CM | POA: Diagnosis not present

## 2018-08-04 DIAGNOSIS — Z1331 Encounter for screening for depression: Secondary | ICD-10-CM | POA: Diagnosis not present

## 2018-08-04 DIAGNOSIS — Z6838 Body mass index (BMI) 38.0-38.9, adult: Secondary | ICD-10-CM | POA: Diagnosis not present

## 2018-08-11 DIAGNOSIS — K2270 Barrett's esophagus without dysplasia: Secondary | ICD-10-CM | POA: Diagnosis not present

## 2018-08-11 DIAGNOSIS — K219 Gastro-esophageal reflux disease without esophagitis: Secondary | ICD-10-CM | POA: Diagnosis not present

## 2018-10-06 DIAGNOSIS — Z79899 Other long term (current) drug therapy: Secondary | ICD-10-CM | POA: Diagnosis not present

## 2018-10-06 DIAGNOSIS — D649 Anemia, unspecified: Secondary | ICD-10-CM | POA: Diagnosis not present

## 2018-10-06 DIAGNOSIS — E785 Hyperlipidemia, unspecified: Secondary | ICD-10-CM | POA: Diagnosis not present

## 2018-10-06 DIAGNOSIS — E669 Obesity, unspecified: Secondary | ICD-10-CM | POA: Diagnosis not present

## 2018-10-06 DIAGNOSIS — Z6838 Body mass index (BMI) 38.0-38.9, adult: Secondary | ICD-10-CM | POA: Diagnosis not present

## 2018-10-06 DIAGNOSIS — E559 Vitamin D deficiency, unspecified: Secondary | ICD-10-CM | POA: Diagnosis not present

## 2018-10-06 DIAGNOSIS — I10 Essential (primary) hypertension: Secondary | ICD-10-CM | POA: Diagnosis not present

## 2018-10-06 DIAGNOSIS — E119 Type 2 diabetes mellitus without complications: Secondary | ICD-10-CM | POA: Diagnosis not present

## 2018-10-06 DIAGNOSIS — M109 Gout, unspecified: Secondary | ICD-10-CM | POA: Diagnosis not present

## 2018-10-27 DIAGNOSIS — Z6839 Body mass index (BMI) 39.0-39.9, adult: Secondary | ICD-10-CM | POA: Diagnosis not present

## 2018-10-27 DIAGNOSIS — M19072 Primary osteoarthritis, left ankle and foot: Secondary | ICD-10-CM | POA: Diagnosis not present

## 2018-10-27 DIAGNOSIS — M79672 Pain in left foot: Secondary | ICD-10-CM | POA: Diagnosis not present

## 2018-11-10 DIAGNOSIS — G2581 Restless legs syndrome: Secondary | ICD-10-CM | POA: Diagnosis not present

## 2018-11-10 DIAGNOSIS — F419 Anxiety disorder, unspecified: Secondary | ICD-10-CM | POA: Diagnosis not present

## 2018-11-10 DIAGNOSIS — K219 Gastro-esophageal reflux disease without esophagitis: Secondary | ICD-10-CM | POA: Diagnosis not present

## 2018-11-10 DIAGNOSIS — K921 Melena: Secondary | ICD-10-CM | POA: Diagnosis not present

## 2018-11-12 DIAGNOSIS — R195 Other fecal abnormalities: Secondary | ICD-10-CM | POA: Diagnosis not present

## 2018-11-12 DIAGNOSIS — D649 Anemia, unspecified: Secondary | ICD-10-CM | POA: Diagnosis not present

## 2018-11-13 DIAGNOSIS — E119 Type 2 diabetes mellitus without complications: Secondary | ICD-10-CM | POA: Diagnosis not present

## 2018-11-13 DIAGNOSIS — M1990 Unspecified osteoarthritis, unspecified site: Secondary | ICD-10-CM | POA: Diagnosis not present

## 2018-11-13 DIAGNOSIS — K2270 Barrett's esophagus without dysplasia: Secondary | ICD-10-CM | POA: Diagnosis not present

## 2018-11-13 DIAGNOSIS — D500 Iron deficiency anemia secondary to blood loss (chronic): Secondary | ICD-10-CM | POA: Diagnosis not present

## 2018-11-13 DIAGNOSIS — Z7982 Long term (current) use of aspirin: Secondary | ICD-10-CM | POA: Diagnosis not present

## 2018-11-13 DIAGNOSIS — K921 Melena: Secondary | ICD-10-CM | POA: Diagnosis not present

## 2018-11-13 DIAGNOSIS — Z791 Long term (current) use of non-steroidal anti-inflammatories (NSAID): Secondary | ICD-10-CM | POA: Diagnosis not present

## 2018-11-13 DIAGNOSIS — K259 Gastric ulcer, unspecified as acute or chronic, without hemorrhage or perforation: Secondary | ICD-10-CM | POA: Diagnosis not present

## 2018-11-13 DIAGNOSIS — Z79899 Other long term (current) drug therapy: Secondary | ICD-10-CM | POA: Diagnosis not present

## 2018-11-13 DIAGNOSIS — K219 Gastro-esophageal reflux disease without esophagitis: Secondary | ICD-10-CM | POA: Diagnosis not present

## 2018-11-13 DIAGNOSIS — K449 Diaphragmatic hernia without obstruction or gangrene: Secondary | ICD-10-CM | POA: Diagnosis not present

## 2018-11-13 DIAGNOSIS — K2970 Gastritis, unspecified, without bleeding: Secondary | ICD-10-CM | POA: Diagnosis not present

## 2018-11-13 DIAGNOSIS — Z7984 Long term (current) use of oral hypoglycemic drugs: Secondary | ICD-10-CM | POA: Diagnosis not present

## 2018-11-13 DIAGNOSIS — E7800 Pure hypercholesterolemia, unspecified: Secondary | ICD-10-CM | POA: Diagnosis not present

## 2018-11-13 DIAGNOSIS — Z800 Family history of malignant neoplasm of digestive organs: Secondary | ICD-10-CM | POA: Diagnosis not present

## 2018-11-13 DIAGNOSIS — J45909 Unspecified asthma, uncomplicated: Secondary | ICD-10-CM | POA: Diagnosis not present

## 2018-11-13 DIAGNOSIS — I10 Essential (primary) hypertension: Secondary | ICD-10-CM | POA: Diagnosis not present

## 2018-11-13 DIAGNOSIS — D649 Anemia, unspecified: Secondary | ICD-10-CM | POA: Diagnosis not present

## 2018-11-13 DIAGNOSIS — K760 Fatty (change of) liver, not elsewhere classified: Secondary | ICD-10-CM | POA: Diagnosis not present

## 2018-11-27 DIAGNOSIS — D649 Anemia, unspecified: Secondary | ICD-10-CM | POA: Diagnosis not present

## 2018-11-27 DIAGNOSIS — E119 Type 2 diabetes mellitus without complications: Secondary | ICD-10-CM | POA: Diagnosis not present

## 2018-11-27 DIAGNOSIS — I10 Essential (primary) hypertension: Secondary | ICD-10-CM | POA: Diagnosis not present

## 2018-11-27 DIAGNOSIS — R945 Abnormal results of liver function studies: Secondary | ICD-10-CM | POA: Diagnosis not present

## 2018-11-27 DIAGNOSIS — E785 Hyperlipidemia, unspecified: Secondary | ICD-10-CM | POA: Diagnosis not present

## 2018-11-27 DIAGNOSIS — Z6839 Body mass index (BMI) 39.0-39.9, adult: Secondary | ICD-10-CM | POA: Diagnosis not present

## 2018-11-29 DIAGNOSIS — K589 Irritable bowel syndrome without diarrhea: Secondary | ICD-10-CM

## 2018-11-29 DIAGNOSIS — R0602 Shortness of breath: Secondary | ICD-10-CM | POA: Diagnosis not present

## 2018-11-29 DIAGNOSIS — I82532 Chronic embolism and thrombosis of left popliteal vein: Secondary | ICD-10-CM | POA: Diagnosis not present

## 2018-11-29 DIAGNOSIS — G4733 Obstructive sleep apnea (adult) (pediatric): Secondary | ICD-10-CM | POA: Diagnosis present

## 2018-11-29 DIAGNOSIS — K219 Gastro-esophageal reflux disease without esophagitis: Secondary | ICD-10-CM

## 2018-11-29 DIAGNOSIS — I10 Essential (primary) hypertension: Secondary | ICD-10-CM | POA: Diagnosis present

## 2018-11-29 DIAGNOSIS — E785 Hyperlipidemia, unspecified: Secondary | ICD-10-CM | POA: Diagnosis present

## 2018-11-29 DIAGNOSIS — I82492 Acute embolism and thrombosis of other specified deep vein of left lower extremity: Secondary | ICD-10-CM | POA: Diagnosis present

## 2018-11-29 DIAGNOSIS — I82432 Acute embolism and thrombosis of left popliteal vein: Secondary | ICD-10-CM | POA: Diagnosis not present

## 2018-11-29 DIAGNOSIS — R9431 Abnormal electrocardiogram [ECG] [EKG]: Secondary | ICD-10-CM | POA: Diagnosis not present

## 2018-11-29 DIAGNOSIS — I82412 Acute embolism and thrombosis of left femoral vein: Secondary | ICD-10-CM | POA: Diagnosis not present

## 2018-11-29 DIAGNOSIS — R2242 Localized swelling, mass and lump, left lower limb: Secondary | ICD-10-CM | POA: Diagnosis not present

## 2018-11-29 DIAGNOSIS — Z7984 Long term (current) use of oral hypoglycemic drugs: Secondary | ICD-10-CM | POA: Diagnosis not present

## 2018-11-29 DIAGNOSIS — I440 Atrioventricular block, first degree: Secondary | ICD-10-CM | POA: Diagnosis present

## 2018-11-29 DIAGNOSIS — I517 Cardiomegaly: Secondary | ICD-10-CM | POA: Diagnosis not present

## 2018-11-29 DIAGNOSIS — Z7982 Long term (current) use of aspirin: Secondary | ICD-10-CM | POA: Diagnosis not present

## 2018-11-29 DIAGNOSIS — R234 Changes in skin texture: Secondary | ICD-10-CM | POA: Diagnosis not present

## 2018-11-29 DIAGNOSIS — E119 Type 2 diabetes mellitus without complications: Secondary | ICD-10-CM | POA: Diagnosis present

## 2018-11-29 DIAGNOSIS — N138 Other obstructive and reflux uropathy: Secondary | ICD-10-CM

## 2018-11-29 DIAGNOSIS — I2699 Other pulmonary embolism without acute cor pulmonale: Secondary | ICD-10-CM | POA: Diagnosis not present

## 2018-11-29 DIAGNOSIS — Z7951 Long term (current) use of inhaled steroids: Secondary | ICD-10-CM | POA: Diagnosis not present

## 2018-11-29 DIAGNOSIS — R42 Dizziness and giddiness: Secondary | ICD-10-CM | POA: Diagnosis not present

## 2018-11-29 DIAGNOSIS — I82452 Acute embolism and thrombosis of left peroneal vein: Secondary | ICD-10-CM | POA: Diagnosis not present

## 2018-11-29 DIAGNOSIS — Z79899 Other long term (current) drug therapy: Secondary | ICD-10-CM | POA: Diagnosis not present

## 2018-11-29 DIAGNOSIS — N400 Enlarged prostate without lower urinary tract symptoms: Secondary | ICD-10-CM | POA: Diagnosis not present

## 2018-11-29 DIAGNOSIS — Z8249 Family history of ischemic heart disease and other diseases of the circulatory system: Secondary | ICD-10-CM | POA: Diagnosis not present

## 2018-11-29 DIAGNOSIS — Z794 Long term (current) use of insulin: Secondary | ICD-10-CM | POA: Diagnosis not present

## 2018-11-29 DIAGNOSIS — M545 Low back pain: Secondary | ICD-10-CM | POA: Diagnosis not present

## 2018-11-29 DIAGNOSIS — M7989 Other specified soft tissue disorders: Secondary | ICD-10-CM | POA: Diagnosis not present

## 2018-11-29 HISTORY — DX: Benign prostatic hyperplasia with lower urinary tract symptoms: N13.8

## 2018-11-29 HISTORY — DX: Irritable bowel syndrome, unspecified: K58.9

## 2018-11-29 HISTORY — DX: Gastro-esophageal reflux disease without esophagitis: K21.9

## 2018-12-10 DIAGNOSIS — I82409 Acute embolism and thrombosis of unspecified deep veins of unspecified lower extremity: Secondary | ICD-10-CM | POA: Diagnosis not present

## 2018-12-10 DIAGNOSIS — Z6839 Body mass index (BMI) 39.0-39.9, adult: Secondary | ICD-10-CM | POA: Diagnosis not present

## 2018-12-10 DIAGNOSIS — Z09 Encounter for follow-up examination after completed treatment for conditions other than malignant neoplasm: Secondary | ICD-10-CM | POA: Diagnosis not present

## 2018-12-10 DIAGNOSIS — I2699 Other pulmonary embolism without acute cor pulmonale: Secondary | ICD-10-CM | POA: Diagnosis not present

## 2018-12-11 DIAGNOSIS — Z7901 Long term (current) use of anticoagulants: Secondary | ICD-10-CM | POA: Diagnosis not present

## 2018-12-11 DIAGNOSIS — I2699 Other pulmonary embolism without acute cor pulmonale: Secondary | ICD-10-CM | POA: Diagnosis not present

## 2018-12-11 DIAGNOSIS — I82412 Acute embolism and thrombosis of left femoral vein: Secondary | ICD-10-CM | POA: Diagnosis not present

## 2018-12-11 DIAGNOSIS — I82402 Acute embolism and thrombosis of unspecified deep veins of left lower extremity: Secondary | ICD-10-CM | POA: Diagnosis not present

## 2018-12-25 DIAGNOSIS — I82412 Acute embolism and thrombosis of left femoral vein: Secondary | ICD-10-CM

## 2018-12-25 DIAGNOSIS — Z86718 Personal history of other venous thrombosis and embolism: Secondary | ICD-10-CM | POA: Diagnosis not present

## 2018-12-25 DIAGNOSIS — Z7901 Long term (current) use of anticoagulants: Secondary | ICD-10-CM

## 2018-12-25 DIAGNOSIS — I2699 Other pulmonary embolism without acute cor pulmonale: Secondary | ICD-10-CM

## 2018-12-25 DIAGNOSIS — Z86711 Personal history of pulmonary embolism: Secondary | ICD-10-CM | POA: Diagnosis not present

## 2018-12-26 DIAGNOSIS — Z800 Family history of malignant neoplasm of digestive organs: Secondary | ICD-10-CM | POA: Diagnosis not present

## 2018-12-26 DIAGNOSIS — K635 Polyp of colon: Secondary | ICD-10-CM | POA: Diagnosis not present

## 2018-12-30 DIAGNOSIS — Z9181 History of falling: Secondary | ICD-10-CM | POA: Diagnosis not present

## 2018-12-30 DIAGNOSIS — Z125 Encounter for screening for malignant neoplasm of prostate: Secondary | ICD-10-CM | POA: Diagnosis not present

## 2018-12-30 DIAGNOSIS — Z0000 Encounter for general adult medical examination without abnormal findings: Secondary | ICD-10-CM | POA: Diagnosis not present

## 2018-12-30 DIAGNOSIS — E785 Hyperlipidemia, unspecified: Secondary | ICD-10-CM | POA: Diagnosis not present

## 2018-12-30 DIAGNOSIS — Z1331 Encounter for screening for depression: Secondary | ICD-10-CM | POA: Diagnosis not present

## 2018-12-30 DIAGNOSIS — E669 Obesity, unspecified: Secondary | ICD-10-CM | POA: Diagnosis not present

## 2018-12-30 DIAGNOSIS — Z6839 Body mass index (BMI) 39.0-39.9, adult: Secondary | ICD-10-CM | POA: Diagnosis not present

## 2018-12-30 DIAGNOSIS — Z136 Encounter for screening for cardiovascular disorders: Secondary | ICD-10-CM | POA: Diagnosis not present

## 2019-01-13 DIAGNOSIS — K219 Gastro-esophageal reflux disease without esophagitis: Secondary | ICD-10-CM | POA: Diagnosis not present

## 2019-01-13 DIAGNOSIS — K449 Diaphragmatic hernia without obstruction or gangrene: Secondary | ICD-10-CM | POA: Diagnosis not present

## 2019-01-13 DIAGNOSIS — I10 Essential (primary) hypertension: Secondary | ICD-10-CM | POA: Diagnosis not present

## 2019-01-13 DIAGNOSIS — Z6839 Body mass index (BMI) 39.0-39.9, adult: Secondary | ICD-10-CM | POA: Diagnosis not present

## 2019-01-13 DIAGNOSIS — Z79899 Other long term (current) drug therapy: Secondary | ICD-10-CM | POA: Diagnosis not present

## 2019-02-09 DIAGNOSIS — K2270 Barrett's esophagus without dysplasia: Secondary | ICD-10-CM | POA: Diagnosis not present

## 2019-02-09 DIAGNOSIS — D649 Anemia, unspecified: Secondary | ICD-10-CM | POA: Diagnosis not present

## 2019-02-09 DIAGNOSIS — K219 Gastro-esophageal reflux disease without esophagitis: Secondary | ICD-10-CM | POA: Diagnosis not present

## 2019-02-11 DIAGNOSIS — E119 Type 2 diabetes mellitus without complications: Secondary | ICD-10-CM | POA: Diagnosis not present

## 2019-02-11 DIAGNOSIS — F419 Anxiety disorder, unspecified: Secondary | ICD-10-CM | POA: Diagnosis not present

## 2019-02-11 DIAGNOSIS — K219 Gastro-esophageal reflux disease without esophagitis: Secondary | ICD-10-CM | POA: Diagnosis not present

## 2019-02-11 DIAGNOSIS — J309 Allergic rhinitis, unspecified: Secondary | ICD-10-CM | POA: Diagnosis not present

## 2019-02-17 DIAGNOSIS — Z79899 Other long term (current) drug therapy: Secondary | ICD-10-CM | POA: Diagnosis not present

## 2019-02-17 DIAGNOSIS — E669 Obesity, unspecified: Secondary | ICD-10-CM | POA: Diagnosis not present

## 2019-02-17 DIAGNOSIS — Z808 Family history of malignant neoplasm of other organs or systems: Secondary | ICD-10-CM | POA: Diagnosis not present

## 2019-02-17 DIAGNOSIS — E119 Type 2 diabetes mellitus without complications: Secondary | ICD-10-CM | POA: Diagnosis not present

## 2019-02-17 DIAGNOSIS — D500 Iron deficiency anemia secondary to blood loss (chronic): Secondary | ICD-10-CM | POA: Diagnosis not present

## 2019-02-17 DIAGNOSIS — Z7984 Long term (current) use of oral hypoglycemic drugs: Secondary | ICD-10-CM | POA: Diagnosis not present

## 2019-02-17 DIAGNOSIS — Z800 Family history of malignant neoplasm of digestive organs: Secondary | ICD-10-CM | POA: Diagnosis not present

## 2019-02-17 DIAGNOSIS — I10 Essential (primary) hypertension: Secondary | ICD-10-CM | POA: Diagnosis not present

## 2019-02-17 DIAGNOSIS — Z8711 Personal history of peptic ulcer disease: Secondary | ICD-10-CM | POA: Diagnosis not present

## 2019-02-17 DIAGNOSIS — Z7982 Long term (current) use of aspirin: Secondary | ICD-10-CM | POA: Diagnosis not present

## 2019-02-17 DIAGNOSIS — D649 Anemia, unspecified: Secondary | ICD-10-CM | POA: Diagnosis not present

## 2019-02-17 DIAGNOSIS — K219 Gastro-esophageal reflux disease without esophagitis: Secondary | ICD-10-CM | POA: Diagnosis not present

## 2019-02-17 DIAGNOSIS — K2970 Gastritis, unspecified, without bleeding: Secondary | ICD-10-CM | POA: Diagnosis not present

## 2019-02-17 DIAGNOSIS — K2210 Ulcer of esophagus without bleeding: Secondary | ICD-10-CM | POA: Diagnosis not present

## 2019-02-17 DIAGNOSIS — Z6838 Body mass index (BMI) 38.0-38.9, adult: Secondary | ICD-10-CM | POA: Diagnosis not present

## 2019-02-17 DIAGNOSIS — Z7901 Long term (current) use of anticoagulants: Secondary | ICD-10-CM | POA: Diagnosis not present

## 2019-02-17 DIAGNOSIS — K2270 Barrett's esophagus without dysplasia: Secondary | ICD-10-CM | POA: Diagnosis not present

## 2019-02-17 DIAGNOSIS — J45909 Unspecified asthma, uncomplicated: Secondary | ICD-10-CM | POA: Diagnosis not present

## 2019-02-17 DIAGNOSIS — Z8719 Personal history of other diseases of the digestive system: Secondary | ICD-10-CM | POA: Diagnosis not present

## 2019-02-17 DIAGNOSIS — K449 Diaphragmatic hernia without obstruction or gangrene: Secondary | ICD-10-CM | POA: Diagnosis not present

## 2019-02-17 DIAGNOSIS — D510 Vitamin B12 deficiency anemia due to intrinsic factor deficiency: Secondary | ICD-10-CM | POA: Diagnosis not present

## 2019-02-17 DIAGNOSIS — K259 Gastric ulcer, unspecified as acute or chronic, without hemorrhage or perforation: Secondary | ICD-10-CM | POA: Diagnosis not present

## 2019-03-12 DIAGNOSIS — M79662 Pain in left lower leg: Secondary | ICD-10-CM | POA: Diagnosis not present

## 2019-03-12 DIAGNOSIS — M7989 Other specified soft tissue disorders: Secondary | ICD-10-CM | POA: Diagnosis not present

## 2019-03-17 DIAGNOSIS — F419 Anxiety disorder, unspecified: Secondary | ICD-10-CM | POA: Diagnosis not present

## 2019-03-17 DIAGNOSIS — Z09 Encounter for follow-up examination after completed treatment for conditions other than malignant neoplasm: Secondary | ICD-10-CM | POA: Diagnosis not present

## 2019-03-17 DIAGNOSIS — Z6839 Body mass index (BMI) 39.0-39.9, adult: Secondary | ICD-10-CM | POA: Diagnosis not present

## 2019-03-17 DIAGNOSIS — E1140 Type 2 diabetes mellitus with diabetic neuropathy, unspecified: Secondary | ICD-10-CM | POA: Diagnosis not present

## 2019-03-24 DIAGNOSIS — D649 Anemia, unspecified: Secondary | ICD-10-CM | POA: Diagnosis not present

## 2019-04-03 DIAGNOSIS — M542 Cervicalgia: Secondary | ICD-10-CM | POA: Diagnosis not present

## 2019-04-03 DIAGNOSIS — E559 Vitamin D deficiency, unspecified: Secondary | ICD-10-CM | POA: Diagnosis not present

## 2019-04-03 DIAGNOSIS — Z79899 Other long term (current) drug therapy: Secondary | ICD-10-CM | POA: Diagnosis not present

## 2019-04-03 DIAGNOSIS — I10 Essential (primary) hypertension: Secondary | ICD-10-CM | POA: Diagnosis not present

## 2019-04-03 DIAGNOSIS — E119 Type 2 diabetes mellitus without complications: Secondary | ICD-10-CM | POA: Diagnosis not present

## 2019-04-03 DIAGNOSIS — M1990 Unspecified osteoarthritis, unspecified site: Secondary | ICD-10-CM | POA: Diagnosis not present

## 2019-04-03 DIAGNOSIS — E785 Hyperlipidemia, unspecified: Secondary | ICD-10-CM | POA: Diagnosis not present

## 2019-04-03 DIAGNOSIS — F419 Anxiety disorder, unspecified: Secondary | ICD-10-CM | POA: Diagnosis not present

## 2019-05-11 DIAGNOSIS — D649 Anemia, unspecified: Secondary | ICD-10-CM | POA: Diagnosis not present

## 2019-05-12 DIAGNOSIS — I959 Hypotension, unspecified: Secondary | ICD-10-CM | POA: Diagnosis not present

## 2019-05-12 DIAGNOSIS — Z6841 Body Mass Index (BMI) 40.0 and over, adult: Secondary | ICD-10-CM | POA: Diagnosis not present

## 2019-05-13 DIAGNOSIS — K219 Gastro-esophageal reflux disease without esophagitis: Secondary | ICD-10-CM | POA: Diagnosis not present

## 2019-05-13 DIAGNOSIS — K2270 Barrett's esophagus without dysplasia: Secondary | ICD-10-CM | POA: Diagnosis not present

## 2019-05-14 DIAGNOSIS — E119 Type 2 diabetes mellitus without complications: Secondary | ICD-10-CM | POA: Diagnosis not present

## 2019-05-14 DIAGNOSIS — I10 Essential (primary) hypertension: Secondary | ICD-10-CM | POA: Diagnosis not present

## 2019-05-14 DIAGNOSIS — G4730 Sleep apnea, unspecified: Secondary | ICD-10-CM | POA: Diagnosis not present

## 2019-05-14 DIAGNOSIS — Z6841 Body Mass Index (BMI) 40.0 and over, adult: Secondary | ICD-10-CM | POA: Diagnosis not present

## 2019-05-28 DIAGNOSIS — L814 Other melanin hyperpigmentation: Secondary | ICD-10-CM | POA: Diagnosis not present

## 2019-05-28 DIAGNOSIS — R233 Spontaneous ecchymoses: Secondary | ICD-10-CM | POA: Diagnosis not present

## 2019-05-28 DIAGNOSIS — L821 Other seborrheic keratosis: Secondary | ICD-10-CM | POA: Diagnosis not present

## 2019-06-11 DIAGNOSIS — H68003 Unspecified Eustachian salpingitis, bilateral: Secondary | ICD-10-CM | POA: Diagnosis not present

## 2019-06-11 DIAGNOSIS — J40 Bronchitis, not specified as acute or chronic: Secondary | ICD-10-CM | POA: Diagnosis not present

## 2019-06-11 DIAGNOSIS — R05 Cough: Secondary | ICD-10-CM | POA: Diagnosis not present

## 2019-06-11 DIAGNOSIS — Z6841 Body Mass Index (BMI) 40.0 and over, adult: Secondary | ICD-10-CM | POA: Diagnosis not present

## 2019-06-11 DIAGNOSIS — R0602 Shortness of breath: Secondary | ICD-10-CM | POA: Diagnosis not present

## 2019-06-11 DIAGNOSIS — J9811 Atelectasis: Secondary | ICD-10-CM | POA: Diagnosis not present

## 2019-06-11 DIAGNOSIS — J309 Allergic rhinitis, unspecified: Secondary | ICD-10-CM | POA: Diagnosis not present

## 2019-06-11 DIAGNOSIS — E119 Type 2 diabetes mellitus without complications: Secondary | ICD-10-CM | POA: Diagnosis not present

## 2019-06-11 DIAGNOSIS — G4730 Sleep apnea, unspecified: Secondary | ICD-10-CM | POA: Diagnosis not present

## 2019-06-22 DIAGNOSIS — B3789 Other sites of candidiasis: Secondary | ICD-10-CM | POA: Diagnosis not present

## 2019-06-22 DIAGNOSIS — E119 Type 2 diabetes mellitus without complications: Secondary | ICD-10-CM | POA: Diagnosis not present

## 2019-06-22 DIAGNOSIS — Z86718 Personal history of other venous thrombosis and embolism: Secondary | ICD-10-CM | POA: Diagnosis not present

## 2019-06-22 DIAGNOSIS — Z86711 Personal history of pulmonary embolism: Secondary | ICD-10-CM | POA: Diagnosis not present

## 2019-07-27 DIAGNOSIS — D649 Anemia, unspecified: Secondary | ICD-10-CM | POA: Diagnosis not present

## 2019-08-10 DIAGNOSIS — I10 Essential (primary) hypertension: Secondary | ICD-10-CM | POA: Diagnosis not present

## 2019-08-10 DIAGNOSIS — E785 Hyperlipidemia, unspecified: Secondary | ICD-10-CM | POA: Diagnosis not present

## 2019-08-10 DIAGNOSIS — E119 Type 2 diabetes mellitus without complications: Secondary | ICD-10-CM | POA: Diagnosis not present

## 2019-08-10 DIAGNOSIS — Z6841 Body Mass Index (BMI) 40.0 and over, adult: Secondary | ICD-10-CM | POA: Diagnosis not present

## 2019-08-13 DIAGNOSIS — K219 Gastro-esophageal reflux disease without esophagitis: Secondary | ICD-10-CM | POA: Diagnosis not present

## 2019-08-13 DIAGNOSIS — D500 Iron deficiency anemia secondary to blood loss (chronic): Secondary | ICD-10-CM | POA: Diagnosis not present

## 2019-08-13 DIAGNOSIS — K2270 Barrett's esophagus without dysplasia: Secondary | ICD-10-CM | POA: Diagnosis not present

## 2019-08-17 DIAGNOSIS — E559 Vitamin D deficiency, unspecified: Secondary | ICD-10-CM | POA: Diagnosis not present

## 2019-08-17 DIAGNOSIS — Z79899 Other long term (current) drug therapy: Secondary | ICD-10-CM | POA: Diagnosis not present

## 2019-08-17 DIAGNOSIS — E119 Type 2 diabetes mellitus without complications: Secondary | ICD-10-CM | POA: Diagnosis not present

## 2019-08-17 DIAGNOSIS — Z23 Encounter for immunization: Secondary | ICD-10-CM | POA: Diagnosis not present

## 2019-08-28 DIAGNOSIS — R109 Unspecified abdominal pain: Secondary | ICD-10-CM | POA: Diagnosis not present

## 2019-09-09 DIAGNOSIS — J40 Bronchitis, not specified as acute or chronic: Secondary | ICD-10-CM | POA: Diagnosis not present

## 2019-09-09 DIAGNOSIS — Z6841 Body Mass Index (BMI) 40.0 and over, adult: Secondary | ICD-10-CM | POA: Diagnosis not present

## 2019-09-09 DIAGNOSIS — R600 Localized edema: Secondary | ICD-10-CM | POA: Diagnosis not present

## 2019-09-14 DIAGNOSIS — I2699 Other pulmonary embolism without acute cor pulmonale: Secondary | ICD-10-CM | POA: Diagnosis not present

## 2019-09-14 DIAGNOSIS — R0600 Dyspnea, unspecified: Secondary | ICD-10-CM | POA: Diagnosis not present

## 2019-09-14 DIAGNOSIS — I340 Nonrheumatic mitral (valve) insufficiency: Secondary | ICD-10-CM | POA: Diagnosis not present

## 2019-09-14 DIAGNOSIS — I4891 Unspecified atrial fibrillation: Secondary | ICD-10-CM | POA: Diagnosis not present

## 2019-09-14 DIAGNOSIS — R0602 Shortness of breath: Secondary | ICD-10-CM | POA: Diagnosis not present

## 2019-09-14 DIAGNOSIS — I10 Essential (primary) hypertension: Secondary | ICD-10-CM | POA: Diagnosis not present

## 2019-09-15 DIAGNOSIS — M1990 Unspecified osteoarthritis, unspecified site: Secondary | ICD-10-CM | POA: Diagnosis present

## 2019-09-15 DIAGNOSIS — Z86711 Personal history of pulmonary embolism: Secondary | ICD-10-CM | POA: Diagnosis not present

## 2019-09-15 DIAGNOSIS — I5021 Acute systolic (congestive) heart failure: Secondary | ICD-10-CM | POA: Diagnosis present

## 2019-09-15 DIAGNOSIS — E785 Hyperlipidemia, unspecified: Secondary | ICD-10-CM | POA: Diagnosis not present

## 2019-09-15 DIAGNOSIS — I119 Hypertensive heart disease without heart failure: Secondary | ICD-10-CM | POA: Diagnosis not present

## 2019-09-15 DIAGNOSIS — Z7901 Long term (current) use of anticoagulants: Secondary | ICD-10-CM | POA: Diagnosis not present

## 2019-09-15 DIAGNOSIS — Z79899 Other long term (current) drug therapy: Secondary | ICD-10-CM | POA: Diagnosis not present

## 2019-09-15 DIAGNOSIS — R0600 Dyspnea, unspecified: Secondary | ICD-10-CM | POA: Diagnosis not present

## 2019-09-15 DIAGNOSIS — E876 Hypokalemia: Secondary | ICD-10-CM | POA: Diagnosis present

## 2019-09-15 DIAGNOSIS — E119 Type 2 diabetes mellitus without complications: Secondary | ICD-10-CM | POA: Diagnosis present

## 2019-09-15 DIAGNOSIS — I110 Hypertensive heart disease with heart failure: Secondary | ICD-10-CM | POA: Diagnosis not present

## 2019-09-15 DIAGNOSIS — Z794 Long term (current) use of insulin: Secondary | ICD-10-CM | POA: Diagnosis not present

## 2019-09-15 DIAGNOSIS — I2699 Other pulmonary embolism without acute cor pulmonale: Secondary | ICD-10-CM | POA: Diagnosis not present

## 2019-09-15 DIAGNOSIS — Z7982 Long term (current) use of aspirin: Secondary | ICD-10-CM | POA: Diagnosis not present

## 2019-09-15 DIAGNOSIS — R0602 Shortness of breath: Secondary | ICD-10-CM | POA: Diagnosis not present

## 2019-09-15 DIAGNOSIS — I4819 Other persistent atrial fibrillation: Secondary | ICD-10-CM | POA: Diagnosis present

## 2019-09-15 DIAGNOSIS — I2693 Single subsegmental pulmonary embolism without acute cor pulmonale: Secondary | ICD-10-CM | POA: Diagnosis not present

## 2019-09-15 DIAGNOSIS — M109 Gout, unspecified: Secondary | ICD-10-CM | POA: Diagnosis present

## 2019-09-16 DIAGNOSIS — I119 Hypertensive heart disease without heart failure: Secondary | ICD-10-CM | POA: Diagnosis not present

## 2019-09-16 DIAGNOSIS — E876 Hypokalemia: Secondary | ICD-10-CM | POA: Diagnosis not present

## 2019-09-16 DIAGNOSIS — Z7901 Long term (current) use of anticoagulants: Secondary | ICD-10-CM | POA: Diagnosis not present

## 2019-09-16 DIAGNOSIS — I4819 Other persistent atrial fibrillation: Secondary | ICD-10-CM | POA: Diagnosis not present

## 2019-09-17 DIAGNOSIS — Z09 Encounter for follow-up examination after completed treatment for conditions other than malignant neoplasm: Secondary | ICD-10-CM | POA: Diagnosis not present

## 2019-09-17 DIAGNOSIS — I2699 Other pulmonary embolism without acute cor pulmonale: Secondary | ICD-10-CM | POA: Diagnosis not present

## 2019-09-17 DIAGNOSIS — R600 Localized edema: Secondary | ICD-10-CM | POA: Diagnosis not present

## 2019-09-17 DIAGNOSIS — Z6841 Body Mass Index (BMI) 40.0 and over, adult: Secondary | ICD-10-CM | POA: Diagnosis not present

## 2019-09-29 ENCOUNTER — Other Ambulatory Visit: Payer: Self-pay

## 2019-09-29 ENCOUNTER — Ambulatory Visit (INDEPENDENT_AMBULATORY_CARE_PROVIDER_SITE_OTHER): Payer: Medicare Other | Admitting: Cardiology

## 2019-09-29 ENCOUNTER — Encounter: Payer: Self-pay | Admitting: Cardiology

## 2019-09-29 VITALS — BP 124/62 | HR 100 | Ht 74.0 in | Wt 303.0 lb

## 2019-09-29 DIAGNOSIS — Z86711 Personal history of pulmonary embolism: Secondary | ICD-10-CM | POA: Diagnosis not present

## 2019-09-29 DIAGNOSIS — E782 Mixed hyperlipidemia: Secondary | ICD-10-CM

## 2019-09-29 DIAGNOSIS — I10 Essential (primary) hypertension: Secondary | ICD-10-CM

## 2019-09-29 DIAGNOSIS — E088 Diabetes mellitus due to underlying condition with unspecified complications: Secondary | ICD-10-CM

## 2019-09-29 DIAGNOSIS — I4819 Other persistent atrial fibrillation: Secondary | ICD-10-CM

## 2019-09-29 DIAGNOSIS — Z1329 Encounter for screening for other suspected endocrine disorder: Secondary | ICD-10-CM

## 2019-09-29 HISTORY — DX: Morbid (severe) obesity due to excess calories: E66.01

## 2019-09-29 HISTORY — DX: Essential (primary) hypertension: I10

## 2019-09-29 HISTORY — DX: Diabetes mellitus due to underlying condition with unspecified complications: E08.8

## 2019-09-29 HISTORY — DX: Personal history of pulmonary embolism: Z86.711

## 2019-09-29 HISTORY — DX: Mixed hyperlipidemia: E78.2

## 2019-09-29 HISTORY — DX: Other persistent atrial fibrillation: I48.19

## 2019-09-29 MED ORDER — AMIODARONE HCL 400 MG PO TABS: ORAL_TABLET | ORAL | 4 refills | Status: DC

## 2019-09-29 MED ORDER — ATORVASTATIN CALCIUM 20 MG PO TABS: 20.0000 mg | ORAL_TABLET | Freq: Every day | ORAL | 3 refills | Status: DC

## 2019-09-29 NOTE — Progress Notes (Signed)
**Note Sean-Identified via Obfuscation** Cardiology Office Note:    Date:  09/29/2019   ID:  [name], DOB [birth year], MRN [id number]  PCP:  [name], MD  Cardiologist:  Jenean Lindau, MD   Referring MD: No ref. provider found    ASSESSMENT:    1. Persistent atrial fibrillation (Sean [name])   2. Essential hypertension   3. Mixed dyslipidemia   4. Diabetes mellitus due to underlying condition with unspecified complications (Sean [name])   5. History of pulmonary embolism   6. Morbid obesity ([place])    PLAN:    In order of problems listed above:   1. Dyspnea on exertion: This could be multifactorial.  Patient is morbidly obese and leads a sedentary lifestyle.  He has history of multiple pulmonary embolism and also this could lead to dyspnea.  However I am concerned about the  status of coronary arteries and we will set him up for a Lexiscan sestamibi. 2. Persistent atrial fibrillation:I discussed with the patient atrial fibrillation, disease process. Management and therapy including rate and rhythm control, anticoagulation benefits and potential risks were discussed extensively with the patient. Patient had multiple questions which were answered to patient's satisfaction.  In view of his dyspnea on exertion and cardiomyopathy I would like to do my best to see if I can get him back into sinus rhythm.  His left atrium is moderately enlarged.  I will initiate him on amiodarone 400 mg daily for 2 weeks and then 200 mg daily.  We will do baseline blood work today and also will get baseline pulmonary function testing.  Risks of amiodarone explained to him at extensive length and he vocalized understanding and both of them had multiple questions which were answered to their satisfaction. 3. Essential hypertension: Blood pressure stable 4. Mixed dyslipidemia: Diabetes mellitus and morbid obesity: Diet was emphasized.  This issue is followed by his primary care physician.  Weight reduction was stressed and risks of obesity explained.  Because  of interaction with amiodarone, I will change him to atorvastatin 20 mg daily.  I have asked him to stop simvastatin today till I get blood work reports by tomorrow.  He understands. 5. History of pulmonary embolism: Recurrent: Patient is on lifelong anticoagulation and benefits and potential risks of anticoagulation explained and he vocalized understanding. 6. Cardiomyopathy: At this time I would like to see if I can get him back into normal rhythm.  Subsequently he may need to be evaluated and treated with other medication such as Entresto if felt necessary. 7. Patient has multiple medical issues and total time for this evaluation was 45 minutes.  This included extensive record review.   Medication Adjustments/Labs and Tests Ordered: Current medicines are reviewed at length with the patient today.  Concerns regarding medicines are outlined above.  No orders of the defined types were placed in this encounter.  No orders of the defined types were placed in this encounter.    No chief complaint on file.    History of Present Illness:    Sean [name]. is a 68 y.o. male.  Patient has past medical history of recurrent pulmonary embolism, persistent atrial fibrillation, essential hypertension, dyslipidemia and diabetes mellitus.  He is morbidly obese.  He leads a sedentary lifestyle.  He was in the hospital and I reviewed records.  He was treated for pulmonary embolism twice this year.  Subsequently now he is on lifetime anticoagulation.  He gives history of shortness of breath on exertion.  No chest pain orthopnea or PND.  Echocardiogram revealed moderately depressed ejection fraction at [facility].  His wife accompanies him for this visit.  At the time of my evaluation, the patient is alert awake oriented and in no distress.  Past Medical History:  Diagnosis Date   Arrhythmia    Atrial fibrillation (Sean [name])    Diabetes mellitus without complication (Sean [name])    Hyperlipidemia     Hypertension     Past Surgical History:  Procedure Laterality Date   CARDIAC CATHETERIZATION     KNEE SURGERY     PILONIDAL CYST / SINUS EXCISION      Current Medications: Current Meds  Medication Sig   albuterol (PROVENTIL) (2.5 MG/3ML) 0.083% nebulizer solution Take 2.5 mg by nebulization 3 (three) times daily as needed for wheezing or shortness of breath.   albuterol (VENTOLIN HFA) 108 (90 Base) MCG/ACT inhaler Inhale 2 puffs into the lungs every 6 (six) hours as needed for wheezing or shortness of breath.   allopurinol (ZYLOPRIM) 100 MG tablet Take 100 mg by mouth daily.   apixaban (ELIQUIS) 5 MG TABS tablet Take 1 tablet by mouth 2 (two) times daily.   Ascorbic Acid (VITAMIN C) 1000 MG tablet Take 1,000 mg by mouth 2 (two) times daily.   B Complex-C (SUPER B COMPLEX PO) Take 400 mcg by mouth daily.   Cholecalciferol (VITAMIN D3) 50 MCG (2000 UT) TABS Take 1 tablet by mouth 2 (two) times daily.   empagliflozin (JARDIANCE) 25 MG TABS tablet Take 25 mg by mouth daily.   ENTRESTO 24-26 MG Take 1 tablet by mouth 2 (two) times daily.   Ergocalciferol (VITAMIN D2 PO) Take by mouth.   fluticasone (FLONASE) 50 MCG/ACT nasal spray Place 2 sprays into both nostrils daily as needed for allergies or rhinitis.   furosemide (LASIX) 40 MG tablet Take 40 mg by mouth daily.   Insulin Glargine (TOUJEO SOLOSTAR Sentinel) Inject 90 Units into the skin at bedtime.   metoprolol tartrate (LOPRESSOR) 25 MG tablet Take 25 mg by mouth 2 (two) times daily.   montelukast (SINGULAIR) 10 MG tablet Take 10 mg by mouth at bedtime.   Omega-3 Fatty Acids (FISH OIL) 1200 MG CAPS Take 1 capsule by mouth 2 (two) times daily.   omeprazole (PRILOSEC) 40 MG capsule Take 40 mg by mouth daily.   rOPINIRole (REQUIP) 2 MG tablet Take 2 mg by mouth at bedtime.   simvastatin (ZOCOR) 40 MG tablet Take 40 mg by mouth daily.   spironolactone (ALDACTONE) 25 MG tablet Take 25 mg by mouth daily.   tamsulosin  (FLOMAX) 0.4 MG CAPS capsule Take 0.4 capsules by mouth 2 (two) times daily.   traZODone (DESYREL) 100 MG tablet Take 100 mg by mouth at bedtime.   venlafaxine XR (EFFEXOR-XR) 150 MG 24 hr capsule Take 1 capsule by mouth daily.   Vitamin D, Ergocalciferol, (DRISDOL) 1.25 MG (50000 UT) CAPS capsule Take 50,000 Units by mouth every 7 (seven) days.     Allergies:   Prednisone   Social History   Socioeconomic History   Marital status: Married    Spouse name: Not on file   Number of children: Not on file   Years of education: Not on file   Highest education level: Not on file  Occupational History   Not on file  Social Needs   Financial resource strain: Not on file   Food insecurity    Worry: Not on file    Inability: Not on file   Transportation needs    Medical:  Not on file    Non-medical: Not on file  Tobacco Use   Smoking status: Never Smoker   Smokeless tobacco: Never Used  Substance and Sexual Activity   Alcohol use: Yes    Comment: socially   Drug use: Never   Sexual activity: Not on file  Lifestyle   Physical activity    Days per week: Not on file    Minutes per session: Not on file   Stress: Not on file  Relationships   Social connections    Talks on phone: Not on file    Gets together: Not on file    Attends religious service: Not on file    Active member of club or organization: Not on file    Attends meetings of clubs or organizations: Not on file    Relationship status: Not on file  Other Topics Concern   Not on file  Social History Narrative   Not on file     Family History: The patient's family history includes Hyperlipidemia in his father; Hypertension in his father.  ROS:   Please see the history of present illness.    All other systems reviewed and are negative.  EKGs/Labs/Other Studies Reviewed:    The following studies were reviewed today: I reviewed [facility] records extensively.  Ejection fraction is 35 to  40%.  Patient is in atrial fibrillation.  He was treated for this and pulmonary embolism.  EKG done today reveals atrial fibrillation with well-controlled ventricular rate.   Recent Labs: No results found for requested labs within last 8760 hours.  Recent Lipid Panel No results found for: CHOL, TRIG, HDL, CHOLHDL, VLDL, LDLCALC, LDLDIRECT  Physical Exam:    VS:  BP 124/62 (BP Location: Right Arm, Patient Position: Sitting, Cuff Size: Large)    Pulse 100    Ht 6\' 2"  (1.88 m)    Wt (!) 303 lb (137.4 kg)    SpO2 93%    BMI 38.90 kg/m     Wt Readings from Last 3 Encounters:  09/29/19 (!) 303 lb (137.4 kg)     GEN: Patient is in no acute distress HEENT: Normal NECK: No JVD; No carotid bruits LYMPHATICS: No lymphadenopathy CARDIAC: Hear sounds regular, 2/6 systolic murmur at the apex. RESPIRATORY:  Clear to auscultation without rales, wheezing or rhonchi  ABDOMEN: Soft, non-tender, non-distended MUSCULOSKELETAL:  No edema; No deformity  SKIN: Warm and dry NEUROLOGIC:  Alert and oriented x 3 PSYCHIATRIC:  Normal affect   Signed, Jenean Lindau, MD  09/29/2019 2:11 PM    [facility]

## 2019-09-29 NOTE — Patient Instructions (Addendum)
Medication Instructions:  Your physician has recommended you make the following change in your medication: START taking amiodarone 400 mg (1 tablet) once daily for one week, then go to 200 mg (0.5 tablet) once daily   STOP taking simvastatin   START taking atorvastatin 20 mg (1 tablet) once daily  *If you need a refill on your cardiac medications before your next appointment, please call your pharmacy*  Lab Work: Your physician recommends that you have a BMP, CBC, TSH and hepatic drawn today  If you have labs (blood work) drawn today and your tests are completely normal, you will receive your results only by:  [place] (if you have MyChart) OR  A paper copy in the mail If you have any lab test that is abnormal or we need to change your treatment, we will call you to review the results.  Testing/Procedures: You had an EKG performed today  Your physician has recommended that you have a pulmonary function test. Pulmonary Function Tests are a group of tests that measure how well air moves in and out of your lungs.  Your physician has requested that you have a lexiscan myoview. For further information please visit [email or web address]. Please follow instruction sheet, as given.    Follow-Up: At [facility], you and your health needs are our priority.  As part of our continuing mission to provide you with exceptional heart care, we have created designated Provider Care Teams.  These Care Teams include your primary Cardiologist (physician) and Advanced Practice Providers (APPs -  Physician Assistants and Nurse Practitioners) who all work together to provide you with the care you need, when you need it.  Your next appointment:   1 month(s)  The format for your next appointment:   In Person  Provider:   Jyl Heinz, MD  Other Instructions Amiodarone tablets What is this medicine? AMIODARONE (a MEE oh da rone) is an antiarrhythmic drug. It helps make your heart beat  regularly. Because of the side effects caused by this medicine, it is only used when other medicines have not worked. It is usually used for heartbeat problems that may be life threatening. This medicine may be used for other purposes; ask your health care provider or pharmacist if you have questions. COMMON BRAND NAME(S): Cordarone, Pacerone What should I tell my health care provider before I take this medicine? They need to know if you have any of these conditions:  liver disease  lung disease  other heart problems  thyroid disease  an unusual or allergic reaction to amiodarone, iodine, other medicines, foods, dyes, or preservatives  pregnant or trying to get pregnant  breast-feeding How should I use this medicine? Take this medicine by mouth with a glass of water. Follow the directions on the prescription label. You can take this medicine with or without food. However, you should always take it the same way each time. Take your doses at regular intervals. Do not take your medicine more often than directed. Do not stop taking except on the advice of your doctor or health care professional. A special MedGuide will be given to you by the pharmacist with each prescription and refill. Be sure to read this information carefully each time. Talk to your pediatrician regarding the use of this medicine in children. Special care may be needed. Overdosage: If you think you have taken too much of this medicine contact a poison control center or emergency room at once. NOTE: This medicine is only for you. Do not share  this medicine with others. What if I miss a dose? If you miss a dose, take it as soon as you can. If it is almost time for your next dose, take only that dose. Do not take double or extra doses. What may interact with this medicine? Do not take this medicine with any of the following medications:  abarelix  apomorphine  arsenic trioxide  certain antibiotics like erythromycin,  gemifloxacin, levofloxacin, pentamidine  certain medicines for depression like amoxapine, tricyclic antidepressants  certain medicines for fungal infections like fluconazole, itraconazole, ketoconazole, posaconazole, voriconazole  certain medicines for irregular heart beat like disopyramide, dronedarone, ibutilide, propafenone, sotalol  certain medicines for malaria like chloroquine, halofantrine  cisapride  droperidol  haloperidol  [place]  maprotiline  methadone  phenothiazines like chlorpromazine, mesoridazine, thioridazine  pimozide  ranolazine  red yeast rice  vardenafil This medicine may also interact with the following medications:  antiviral medicines for HIV or AIDS  certain medicines for blood pressure, heart disease, irregular heart beat  certain medicines for cholesterol like atorvastatin, cerivastatin, lovastatin, simvastatin  certain medicines for hepatitis C like sofosbuvir and ledipasvir; sofosbuvir  certain medicines for seizures like phenytoin  certain medicines for thyroid problems  certain medicines that treat or prevent blood clots like warfarin  cholestyramine  cimetidine  clopidogrel  cyclosporine  dextromethorphan  diuretics  dofetilide  fentanyl  general anesthetics  grapefruit juice  lidocaine  loratadine  methotrexate  other medicines that prolong the QT interval (cause an abnormal heart rhythm)  procainamide  quinidine  rifabutin, rifampin, or rifapentine  St. John's Wort  trazodone  ziprasidone This list may not describe all possible interactions. Give your health care provider a list of all the medicines, herbs, non-prescription drugs, or dietary supplements you use. Also tell them if you smoke, drink alcohol, or use illegal drugs. Some items may interact with your medicine. What should I watch for while using this medicine? Your condition will be monitored closely when you first begin therapy.  Often, this drug is first started in a hospital or other monitored health care setting. Once you are on maintenance therapy, visit your doctor or health care professional for regular checks on your progress. Because your condition and use of this medicine carry some risk, it is a good idea to carry an identification card, necklace or bracelet with details of your condition, medications, and doctor or health care professional. Dennis Bast may get drowsy or dizzy. Do not drive, use machinery, or do anything that needs mental alertness until you know how this medicine affects you. Do not stand or sit up quickly, especially if you are an older patient. This reduces the risk of dizzy or fainting spells. This medicine can make you more sensitive to the sun. Keep out of the sun. If you cannot avoid being in the sun, wear protective clothing and use sunscreen. Do not use sun lamps or tanning beds/booths. You should have regular eye exams before and during treatment. Call your doctor if you have blurred vision, see halos, or your eyes become sensitive to light. Your eyes may get dry. It may be helpful to use a lubricating eye solution or artificial tears solution. If you are going to have surgery or a procedure that requires contrast dyes, tell your doctor or health care professional that you are taking this medicine. What side effects may I notice from receiving this medicine? Side effects that you should report to your doctor or health care professional as soon as possible:  allergic  reactions like skin rash, itching or hives, swelling of the face, lips, or tongue  blue-gray coloring of the skin  blurred vision, seeing blue green halos, increased sensitivity of the eyes to light  breathing problems  chest pain  dark urine  fast, irregular heartbeat  feeling faint or light-headed  intolerance to heat or cold  nausea or vomiting  pain and swelling of the scrotum  pain, tingling, numbness in feet,  hands  redness, blistering, peeling or loosening of the skin, including inside the mouth  spitting up blood  stomach pain  sweating  unusual or uncontrolled movements of body  unusually weak or tired  weight gain or loss  yellowing of the eyes or skin Side effects that usually do not require medical attention (report to your doctor or health care professional if they continue or are bothersome):  change in sex drive or performance  constipation  dizziness  headache  loss of appetite  trouble sleeping This list may not describe all possible side effects. Call your doctor for medical advice about side effects. You may report side effects to FDA at 1-800-FDA-1088. Where should I keep my medicine? Keep out of the reach of children. Store at room temperature between 20 and 25 degrees C (68 and 77 degrees F). Protect from light. Keep container tightly closed. Throw away any unused medicine after the expiration date. NOTE: This sheet is a summary. It may not cover all possible information. If you have questions about this medicine, talk to your doctor, pharmacist, or health care provider.  2020 Elsevier/Gold Standard (2018-09-17 13:44:04)  Pulmonary Function Tests Pulmonary function tests (PFTs) are used to measure how well your lungs work, find out what is causing your lung problems, and figure out the best treatment for you. You may have PFTs:  When you have an illness involving the lungs.  To follow changes in your lung function over time if you have a chronic lung disease.  If you are an Nature conservation officer. This checks the effects of being exposed to chemicals over a long period of time.  To check lung function before having surgery or other procedures.  To check your lungs if you smoke.  To check if prescribed medicines or treatments are helping your lungs. Your results will be compared to the expected lung function of someone with healthy lungs who is similar  to you in:  Age.  Gender.  Height.  Weight.  Race or ethnicity. This is done to show how your lungs compare to normal lung function (percent predicted). This is how your health care provider knows if your lung function is normal or not. If you have had PFTs done before, your health care provider will compare your current results with past results. This shows if your lung function is better, worse, or the same as before. Tell a health care provider about:  Any allergies you have.  All medicines you are taking, including inhaler or nebulizer medicines, vitamins, herbs, eye drops, creams, and over-the-counter medicines.  Any blood disorders you have.  Any surgeries you have had, especially recent eye surgery, abdominal surgery, or chest surgery. These can make PFTs difficult or unsafe.  Any medical conditions you have, including chest pain or heart problems, tuberculosis, or respiratory infections such as pneumonia, a cold, or the flu.  Any fear of being in closed spaces (claustrophobia). Some of your tests may be in a closed space. What are the risks? Generally, this is a safe procedure. However, problems  may occur, including:  Light-headedness due to over-breathing (hyperventilation).  An asthma attack from deep breathing.  A collapsed lung. What happens before the procedure?  Take over-the-counter and prescription medicines only as told by your health care provider. If you take inhaler or nebulizer medicines, ask your health care provider which medicines you should take on the day of your testing. Some inhaler medicines may interfere with PFTs if they are taken shortly before the tests.  Follow your health care provider's instructions on eating and drinking restrictions. This may include avoiding eating large meals and drinking alcohol before the testing.  Do not use any products that contain nicotine or tobacco, such as cigarettes and e-cigarettes. If you need help quitting,  ask your health care provider.  Wear comfortable clothing that will not interfere with breathing. What happens during the procedure?   You will be given a soft nose clip to wear. This is done so all of your breaths will go through your mouth instead of your nose.  You will be given a germ-free (sterile) mouthpiece. It will be attached to a machine that measures your breathing (spirometer).  You will be asked to do various breathing maneuvers. The maneuvers will be done by breathing in (inhaling) and breathing out (exhaling). You may be asked to repeat the maneuvers several times before the testing is done.  It is important to follow the instructions exactly to get accurate results. Make sure to blow as hard and as fast as you can when you are told to do so.  You may be given a medicine that makes the small air passages in your lungs larger (bronchodilator) after testing has been done. This medicine will make it easier for you to breathe.  The tests will be repeated after the bronchodilator has taken effect.  You will be monitored carefully during the procedure for faintness, dizziness, trouble breathing, or any other problems. The procedure may vary among health care providers and hospitals. What happens after the procedure?  It is up to you to get your test results. Ask your health care provider, or the department that is doing the tests, when your results will be ready. After you have received your test results, talk with your health care provider about treatment options, if necessary. Summary  Pulmonary function tests (PFTs) are used to measure how well your lungs work, find out what is causing your lung problems, and figure out the best treatment for you.  Wear comfortable clothing that will not interfere with breathing.  It is up to you to get your test results. After you have received them, talk with your health care provider about treatment options, if necessary. This information  is not intended to replace advice given to you by your health care provider. Make sure you discuss any questions you have with your health care provider. Document Released: 06/07/2004 Document Revised: 10/12/2016 Document Reviewed: 09/06/2016 Elsevier Patient Education  [place] injection What is this medicine? REGADENOSON is used to test the heart for coronary artery disease. It is used in patients who can not exercise for their stress test. This medicine may be used for other purposes; ask your health care provider or pharmacist if you have questions. COMMON BRAND NAME(S): Lexiscan What should I tell my health care provider before I take this medicine? They need to know if you have any of these conditions:  heart problems  lung or breathing disease, like asthma or COPD  an unusual or allergic reaction  to regadenoson, other medicines, foods, dyes, or preservatives  pregnant or trying to get pregnant  breast-feeding How should I use this medicine? This medicine is for injection into a vein. It is given by a health care professional in a hospital or clinic setting. Talk to your pediatrician regarding the use of this medicine in children. Special care may be needed. Overdosage: If you think you have taken too much of this medicine contact a poison control center or emergency room at once. NOTE: This medicine is only for you. Do not share this medicine with others. What if I miss a dose? This does not apply. What may interact with this medicine?  caffeine  dipyridamole  guarana  theophylline This list may not describe all possible interactions. Give your health care provider a list of all the medicines, herbs, non-prescription drugs, or dietary supplements you use. Also tell them if you smoke, drink alcohol, or use illegal drugs. Some items may interact with your medicine. What should I watch for while using this medicine? Your condition will be monitored  carefully while you are receiving this medicine. Do not take medicines, foods, or drinks with caffeine (like coffee, tea, or colas) for at least 12 hours before your test. If you do not know if something contains caffeine, ask your health care professional. What side effects may I notice from receiving this medicine? Side effects that you should report to your doctor or health care professional as soon as possible:  allergic reactions like skin rash, itching or hives, swelling of the face, lips, or tongue  breathing problems  chest pain, tightness or palpitations  severe headache Side effects that usually do not require medical attention (report to your doctor or health care professional if they continue or are bothersome):  flushing  headache  irritation or pain at site where injected  nausea, vomiting This list may not describe all possible side effects. Call your doctor for medical advice about side effects. You may report side effects to FDA at 1-800-FDA-1088. Where should I keep my medicine? This drug is given in a hospital or clinic and will not be stored at home. NOTE: This sheet is a summary. It may not cover all possible information. If you have questions about this medicine, talk to your doctor, pharmacist, or health care provider.  2020 Elsevier/Gold Standard (2008-06-14 15:08:13)  Cardiac Nuclear Scan A cardiac nuclear scan is a test that is done to check the flow of blood to your heart. It is done when you are resting and when you are exercising. The test looks for problems such as:  Not enough blood reaching a portion of the heart.  The heart muscle not working as it should. You may need this test if:  You have heart disease.  You have had lab results that are not normal.  You have had heart surgery or a balloon procedure to open up blocked arteries (angioplasty).  You have chest pain.  You have shortness of breath. In this test, a special dye (tracer) is  put into your bloodstream. The tracer will travel to your heart. A camera will then take pictures of your heart to see how the tracer moves through your heart. This test is usually done at a hospital and takes 2-4 hours. Tell a doctor about:  Any allergies you have.  All medicines you are taking, including vitamins, herbs, eye drops, creams, and over-the-counter medicines.  Any problems you or family members have had with anesthetic medicines.  Any  blood disorders you have.  Any surgeries you have had.  Any medical conditions you have.  Whether you are pregnant or may be pregnant. What are the risks? Generally, this is a safe test. However, problems may occur, such as:  Serious chest pain and heart attack. This is only a risk if the stress portion of the test is done.  Rapid heartbeat.  A feeling of warmth in your chest. This feeling usually does not last long.  Allergic reaction to the tracer. What happens before the test?  Ask your doctor about changing or stopping your normal medicines. This is important.  Follow instructions from your doctor about what you cannot eat or drink.  Remove your jewelry on the day of the test. What happens during the test?  An IV tube will be inserted into one of your veins.  Your doctor will give you a small amount of tracer through the IV tube.  You will wait for 20-40 minutes while the tracer moves through your bloodstream.  Your heart will be monitored with an electrocardiogram (ECG).  You will lie down on an exam table.  Pictures of your heart will be taken for about 15-20 minutes.  You may also have a stress test. For this test, one of these things may be done: ? You will be asked to exercise on a treadmill or a stationary bike. ? You will be given medicines that will make your heart work harder. This is done if you are unable to exercise.  When blood flow to your heart has peaked, a tracer will again be given through the IV  tube.  After 20-40 minutes, you will get back on the exam table. More pictures will be taken of your heart.  Depending on the tracer that is used, more pictures may need to be taken 3-4 hours later.  Your IV tube will be removed when the test is over. The test may vary among doctors and hospitals. What happens after the test?  Ask your doctor: ? Whether you can return to your normal schedule, including diet, activities, and medicines. ? Whether you should drink more fluids. This will help to remove the tracer from your body. Drink enough fluid to keep your pee (urine) pale yellow.  Ask your doctor, or the department that is doing the test: ? When will my results be ready? ? How will I get my results? Summary  A cardiac nuclear scan is a test that is done to check the flow of blood to your heart.  Tell your doctor whether you are pregnant or may be pregnant.  Before the test, ask your doctor about changing or stopping your normal medicines. This is important.  Ask your doctor whether you can return to your normal activities. You may be asked to drink more fluids. This information is not intended to replace advice given to you by your health care provider. Make sure you discuss any questions you have with your health care provider. Document Released: 03/31/2018 Document Revised: 02/04/2019 Document Reviewed: 03/31/2018 Elsevier Patient Education  2020 [employer].

## 2019-09-30 LAB — BASIC METABOLIC PANEL
BUN/Creatinine Ratio: 11 (ref 10–24)
BUN: 19 mg/dL (ref 8–27)
CO2: 25 mmol/L (ref 20–29)
Calcium: 9.4 mg/dL (ref 8.6–10.2)
Chloride: 98 mmol/L (ref 96–106)
Creatinine, Ser: 1.72 mg/dL — ABNORMAL HIGH (ref 0.76–1.27)
GFR calc Af Amer: 46 mL/min/{1.73_m2} — ABNORMAL LOW (ref >59)
GFR calc non Af Amer: 39 mL/min/{1.73_m2} — ABNORMAL LOW (ref >59)
Glucose: 243 mg/dL — ABNORMAL HIGH (ref 65–99)
Potassium: 5.1 mmol/L (ref 3.5–5.2)
Sodium: 137 mmol/L (ref 134–144)

## 2019-09-30 LAB — HEPATIC FUNCTION PANEL
ALT: 32 IU/L (ref 0–44)
AST: 22 IU/L (ref 0–40)
Albumin: 4.2 g/dL (ref 3.8–4.8)
Alkaline Phosphatase: 74 IU/L (ref 39–117)
Bilirubin Total: 0.6 mg/dL (ref 0.0–1.2)
Bilirubin, Direct: 0.23 mg/dL (ref 0.00–0.40)
Total Protein: 7.1 g/dL (ref 6.0–8.5)

## 2019-09-30 LAB — CBC
Hematocrit: 43 % (ref 37.5–51.0)
Hemoglobin: 14.2 g/dL (ref 13.0–17.7)
MCH: 28.5 pg (ref 26.6–33.0)
MCHC: 33 g/dL (ref 31.5–35.7)
MCV: 86 fL (ref 79–97)
Platelets: 178 10*3/uL (ref 150–450)
RBC: 4.98 x10E6/uL (ref 4.14–5.80)
RDW: 18 % — ABNORMAL HIGH (ref 11.6–15.4)
WBC: 6 10*3/uL (ref 3.4–10.8)

## 2019-09-30 LAB — TSH: TSH: 1.34 u[IU]/mL (ref 0.450–4.500)

## 2019-10-01 ENCOUNTER — Telehealth: Payer: Self-pay

## 2019-10-01 NOTE — Telephone Encounter (Signed)
Patient scheduled for PFT study on 10/06/19 at 1030 AM at [facility]. RN went over procedure instructions with no further questions.

## 2019-10-07 ENCOUNTER — Telehealth: Payer: Self-pay | Admitting: Cardiology

## 2019-10-07 ENCOUNTER — Telehealth: Payer: Self-pay

## 2019-10-07 DIAGNOSIS — I10 Essential (primary) hypertension: Secondary | ICD-10-CM

## 2019-10-07 DIAGNOSIS — E782 Mixed hyperlipidemia: Secondary | ICD-10-CM

## 2019-10-07 NOTE — Telephone Encounter (Signed)
Information relayed, copy sent to Dr. Rica Records

## 2019-10-07 NOTE — Telephone Encounter (Signed)
Patient states he is more fatigued and he thinks amiodarone/trazadone are interacting. Unable to ambulate due to weakness/SOB. He was originally on temezepam but Dr. Rica Records took him off of it. Patient advised to call [facility] pharmacist to get contraindication information, note routed to [facility] for advisement.

## 2019-10-07 NOTE — Telephone Encounter (Signed)
Please call, patient he is not feeling well

## 2019-10-07 NOTE — Telephone Encounter (Signed)
-----   Message from Jenean Lindau, MD sent at 09/30/2019  9:31 AM EST ----- The results of the study is unremarkable.  Issues with kidney function will be managed by primary care doctor please inform patient. I will discuss in detail at next appointment. Cc  primary care/referring physician Jenean Lindau, MD 09/30/2019 9:30 AM

## 2019-10-08 LAB — PULMONARY FUNCTION TEST

## 2019-10-08 NOTE — Addendum Note (Signed)
Addended by: Ashok Norris on: 10/08/2019 02:10 PM   Modules accepted: Orders

## 2019-10-08 NOTE — Telephone Encounter (Signed)
Called patient informed him per Dr. Geraldo Pitter to check with pharmacists regarding if there was a interaction between the medications. He reports that he did this and the pharmacists told him they don't know. The patient wants an answer of what to do. Since starting the amiodarone he has become weak and has little energy. Will consult with Dr. Geraldo Pitter to see how he would like to advise further.

## 2019-10-08 NOTE — Telephone Encounter (Addendum)
Called patient advised him per Dr. Geraldo Pitter it is best for him to stop the trazedone for now and follow up with pcp about being switched to something else. Patient verbally understood no further questions. Patient also advised to come in for labs per Dr. Geraldo Pitter. He verbally understood.

## 2019-10-08 NOTE — Telephone Encounter (Signed)
I checked with the pharmacy and it is best to stop the trazodone.  Patient will need to come in for blood work Chem-7 CBC and liver lipid check and will have to check with his primary care physician about switching trazodone to another medication.

## 2019-10-08 NOTE — Telephone Encounter (Signed)
His pharmacist needs to advise him to see if there is any interaction between these medications.  Also if there is then trazodone may have to be changed by his primary care doctor.

## 2019-10-09 DIAGNOSIS — I10 Essential (primary) hypertension: Secondary | ICD-10-CM | POA: Diagnosis not present

## 2019-10-09 DIAGNOSIS — E782 Mixed hyperlipidemia: Secondary | ICD-10-CM | POA: Diagnosis not present

## 2019-10-09 LAB — HEPATIC FUNCTION PANEL
ALT: 26 IU/L (ref 0–44)
AST: 17 IU/L (ref 0–40)
Albumin: 4.1 g/dL (ref 3.8–4.8)
Alkaline Phosphatase: 68 IU/L (ref 39–117)
Bilirubin Total: 0.5 mg/dL (ref 0.0–1.2)
Bilirubin, Direct: 0.23 mg/dL (ref 0.00–0.40)
Total Protein: 6.8 g/dL (ref 6.0–8.5)

## 2019-10-09 LAB — CBC
Hematocrit: 42.9 % (ref 37.5–51.0)
Hemoglobin: 14.1 g/dL (ref 13.0–17.7)
MCH: 28.7 pg (ref 26.6–33.0)
MCHC: 32.9 g/dL (ref 31.5–35.7)
MCV: 87 fL (ref 79–97)
Platelets: 188 10*3/uL (ref 150–450)
RBC: 4.91 x10E6/uL (ref 4.14–5.80)
RDW: 18.1 % — ABNORMAL HIGH (ref 11.6–15.4)
WBC: 6.3 10*3/uL (ref 3.4–10.8)

## 2019-10-09 LAB — BASIC METABOLIC PANEL
BUN/Creatinine Ratio: 17 (ref 10–24)
BUN: 27 mg/dL (ref 8–27)
CO2: 27 mmol/L (ref 20–29)
Calcium: 9.7 mg/dL (ref 8.6–10.2)
Chloride: 102 mmol/L (ref 96–106)
Creatinine, Ser: 1.61 mg/dL — ABNORMAL HIGH (ref 0.76–1.27)
GFR calc Af Amer: 49 mL/min/{1.73_m2} — ABNORMAL LOW (ref >59)
GFR calc non Af Amer: 43 mL/min/{1.73_m2} — ABNORMAL LOW (ref >59)
Glucose: 115 mg/dL — ABNORMAL HIGH (ref 65–99)
Potassium: 4.7 mmol/L (ref 3.5–5.2)
Sodium: 141 mmol/L (ref 134–144)

## 2019-10-09 LAB — LIPID PANEL
Chol/HDL Ratio: 2.9 ratio (ref 0.0–5.0)
Cholesterol, Total: 135 mg/dL (ref 100–199)
HDL: 47 mg/dL (ref >39)
LDL Chol Calc (NIH): 61 mg/dL (ref 0–99)
Triglycerides: 159 mg/dL — ABNORMAL HIGH (ref 0–149)
VLDL Cholesterol Cal: 27 mg/dL (ref 5–40)

## 2019-10-12 ENCOUNTER — Telehealth: Payer: Self-pay | Admitting: Emergency Medicine

## 2019-10-12 DIAGNOSIS — I10 Essential (primary) hypertension: Secondary | ICD-10-CM | POA: Diagnosis not present

## 2019-10-12 DIAGNOSIS — R600 Localized edema: Secondary | ICD-10-CM | POA: Diagnosis not present

## 2019-10-12 DIAGNOSIS — Z6841 Body Mass Index (BMI) 40.0 and over, adult: Secondary | ICD-10-CM | POA: Diagnosis not present

## 2019-10-12 NOTE — Telephone Encounter (Signed)
Left message for patient to return call regarding lab results.  

## 2019-10-13 ENCOUNTER — Telehealth: Payer: Self-pay | Admitting: Cardiology

## 2019-10-13 NOTE — Telephone Encounter (Signed)
Call eliquis to [facility]

## 2019-10-14 ENCOUNTER — Other Ambulatory Visit: Payer: Self-pay | Admitting: Cardiology

## 2019-10-15 ENCOUNTER — Other Ambulatory Visit: Payer: Self-pay | Admitting: Sports Medicine

## 2019-10-15 ENCOUNTER — Other Ambulatory Visit: Payer: Self-pay

## 2019-10-15 ENCOUNTER — Ambulatory Visit (INDEPENDENT_AMBULATORY_CARE_PROVIDER_SITE_OTHER): Payer: Medicare Other | Admitting: Sports Medicine

## 2019-10-15 ENCOUNTER — Encounter: Payer: Self-pay | Admitting: Sports Medicine

## 2019-10-15 DIAGNOSIS — M79671 Pain in right foot: Secondary | ICD-10-CM

## 2019-10-15 DIAGNOSIS — R234 Changes in skin texture: Secondary | ICD-10-CM

## 2019-10-15 DIAGNOSIS — M79672 Pain in left foot: Secondary | ICD-10-CM

## 2019-10-15 DIAGNOSIS — L923 Foreign body granuloma of the skin and subcutaneous tissue: Secondary | ICD-10-CM

## 2019-10-15 DIAGNOSIS — E088 Diabetes mellitus due to underlying condition with unspecified complications: Secondary | ICD-10-CM

## 2019-10-15 NOTE — Progress Notes (Signed)
Subjective: Sean [name]. is a 68 y.o. male patient seen in office for evaluation of ulceration of the right heel. Patient has a history of diabetes and a blood glucose level today of 87 mg/dl.   Patient reports that he went to PCP and also noticed on yesterday to the bottom of the right heel at the medial side a dark and red area that looked like dried blood patient reports that he suffers with dry cracking skin at heels and has not had any pain to this new lesion area in the significant swelling or drainage but was seen yesterday by PCP who started him on doxycycline and sent in a referral.  Patient also was hospitalized about 2 weeks ago for congestive heart failure and is on Eliquis and Lasix as well as insulin and Jardiance for his diabetes.  Patient denies nausea/fever/vomiting/chills/night sweats/shortness of breath/pain. Patient has no other pedal complaints at this time.  Review of Systems  All other systems reviewed and are negative.    Patient Active Problem List   Diagnosis Date Noted  . Persistent atrial fibrillation ([place]) 09/29/2019  . Essential hypertension 09/29/2019  . Mixed dyslipidemia 09/29/2019  . Diabetes mellitus due to underlying condition with unspecified complications ([place]) [id number]  . History of pulmonary embolism 09/29/2019  . Morbid obesity ([place]) 09/29/2019  . Benign prostatic hyperplasia 11/29/2018  . GERD (gastroesophageal reflux disease) 11/29/2018  . IBS (irritable bowel syndrome) 11/29/2018  . Fissure in skin of foot 12/11/2016  . Overgrown toenails 12/11/2016  . Plantar fasciitis, bilateral 06/05/2016   Current Outpatient Medications on File Prior to Visit  Medication Sig Dispense Refill  . albuterol (PROVENTIL) (2.5 MG/3ML) 0.083% nebulizer solution Take 2.5 mg by nebulization 3 (three) times daily as needed for wheezing or shortness of breath.    Marland Kitchen albuterol (VENTOLIN HFA) 108 (90 Base) MCG/ACT inhaler Inhale 2 puffs into the lungs every 6 (six)  hours as needed for wheezing or shortness of breath.    . allopurinol (ZYLOPRIM) 100 MG tablet Take 100 mg by mouth daily.    Marland Kitchen amiodarone (PACERONE) 400 MG tablet taking amiodarone 400 mg (1 tablet) once daily for one week, then go to 200 mg (0.5 tablet) once daily 30 tablet 4  . apixaban (ELIQUIS) 5 MG TABS tablet Take 1 tablet by mouth 2 (two) times daily.    . Ascorbic Acid (VITAMIN C) 1000 MG tablet Take 1,000 mg by mouth 2 (two) times daily.    Marland Kitchen atorvastatin (LIPITOR) 20 MG tablet Take 1 tablet (20 mg total) by mouth daily. 30 tablet 3  . B Complex-C (SUPER B COMPLEX PO) Take 400 mcg by mouth daily.    . Cholecalciferol (VITAMIN D3) 50 MCG (2000 UT) TABS Take 1 tablet by mouth 2 (two) times daily.    . empagliflozin (JARDIANCE) 25 MG TABS tablet Take 25 mg by mouth daily.    Marland Kitchen ENTRESTO 24-26 MG TAKE ONE TABLET BY MOUTH TWICE DAILY 60 tablet 5  . Ergocalciferol (VITAMIN D2 PO) Take by mouth.    . fluticasone (FLONASE) 50 MCG/ACT nasal spray Place 2 sprays into both nostrils daily as needed for allergies or rhinitis.    . furosemide (LASIX) 40 MG tablet Take 40 mg by mouth daily.    . Insulin Glargine (TOUJEO SOLOSTAR [place]) Inject 90 Units into the skin at bedtime.    . metoprolol tartrate (LOPRESSOR) 25 MG tablet Take 25 mg by mouth 2 (two) times daily.    . montelukast (SINGULAIR) 10  MG tablet Take 10 mg by mouth at bedtime.    . Omega-3 Fatty Acids (FISH OIL) 1200 MG CAPS Take 1 capsule by mouth 2 (two) times daily.    Marland Kitchen omeprazole (PRILOSEC) 40 MG capsule Take 40 mg by mouth daily.    Marland Kitchen rOPINIRole (REQUIP) 2 MG tablet Take 2 mg by mouth at bedtime.    Marland Kitchen spironolactone (ALDACTONE) 25 MG tablet Take 25 mg by mouth daily.    . tamsulosin (FLOMAX) 0.4 MG CAPS capsule Take 0.4 capsules by mouth 2 (two) times daily.    . traZODone (DESYREL) 100 MG tablet Take 100 mg by mouth at bedtime.    Marland Kitchen venlafaxine XR (EFFEXOR-XR) 150 MG 24 hr capsule Take 1 capsule by mouth daily.    . Vitamin D,  Ergocalciferol, (DRISDOL) 1.25 MG (50000 UT) CAPS capsule Take 50,000 Units by mouth every 7 (seven) days.     No current facility-administered medications on file prior to visit.   Allergies  Allergen Reactions  . Prednisone Other (See Comments)    nervous    Recent Results (from the past 2160 hour(s))  Basic Metabolic Panel (BMET)     Status: Abnormal   Collection Time: 09/29/19  2:31 PM  Result Value Ref Range   Glucose 243 (H) 65 - 99 mg/dL   BUN 19 8 - 27 mg/dL   Creatinine, Ser 1.72 (H) 0.76 - 1.27 mg/dL   GFR calc non Af Amer 39 (L) >59 mL/min/1.73   GFR calc Af Amer 46 (L) >59 mL/min/1.73   BUN/Creatinine Ratio 11 10 - 24   Sodium 137 134 - 144 mmol/L   Potassium 5.1 3.5 - 5.2 mmol/L   Chloride 98 96 - 106 mmol/L   CO2 25 20 - 29 mmol/L   Calcium 9.4 8.6 - 10.2 mg/dL  TSH     Status: None   Collection Time: 09/29/19  2:31 PM  Result Value Ref Range   TSH 1.340 0.450 - 4.500 uIU/mL  CBC     Status: Abnormal   Collection Time: 09/29/19  2:31 PM  Result Value Ref Range   WBC 6.0 3.4 - 10.8 x10E3/uL   RBC 4.98 4.14 - 5.80 x10E6/uL   Hemoglobin 14.2 13.0 - 17.7 g/dL   Hematocrit 43.0 37.5 - 51.0 %   MCV 86 79 - 97 fL   MCH 28.5 26.6 - 33.0 pg   MCHC 33.0 31.5 - 35.7 g/dL   RDW 18.0 (H) 11.6 - 15.4 %   Platelets 178 150 - 450 x10E3/uL  Hepatic function panel     Status: None   Collection Time: 09/29/19  2:31 PM  Result Value Ref Range   Total Protein 7.1 6.0 - 8.5 g/dL   Albumin 4.2 3.8 - 4.8 g/dL   Bilirubin Total 0.6 0.0 - 1.2 mg/dL   Bilirubin, Direct 0.23 0.00 - 0.40 mg/dL   Alkaline Phosphatase 74 39 - 117 IU/L   AST 22 0 - 40 IU/L   ALT 32 0 - 44 IU/L  Basic Metabolic Panel (BMET)     Status: Abnormal   Collection Time: 10/09/19  8:20 AM  Result Value Ref Range   Glucose 115 (H) 65 - 99 mg/dL   BUN 27 8 - 27 mg/dL   Creatinine, Ser 1.61 (H) 0.76 - 1.27 mg/dL   GFR calc non Af Amer 43 (L) >59 mL/min/1.73   GFR calc Af Amer 49 (L) >59 mL/min/1.73    BUN/Creatinine Ratio 17 10 - 24  Sodium 141 134 - 144 mmol/L   Potassium 4.7 3.5 - 5.2 mmol/L   Chloride 102 96 - 106 mmol/L   CO2 27 20 - 29 mmol/L   Calcium 9.7 8.6 - 10.2 mg/dL  CBC     Status: Abnormal   Collection Time: 10/09/19  8:20 AM  Result Value Ref Range   WBC 6.3 3.4 - 10.8 x10E3/uL   RBC 4.91 4.14 - 5.80 x10E6/uL   Hemoglobin 14.1 13.0 - 17.7 g/dL   Hematocrit 42.9 37.5 - 51.0 %   MCV 87 79 - 97 fL   MCH 28.7 26.6 - 33.0 pg   MCHC 32.9 31.5 - 35.7 g/dL   RDW 18.1 (H) 11.6 - 15.4 %   Platelets 188 150 - 450 x10E3/uL  Lipid Profile     Status: Abnormal   Collection Time: 10/09/19  8:20 AM  Result Value Ref Range   Cholesterol, Total 135 100 - 199 mg/dL   Triglycerides 159 (H) 0 - 149 mg/dL   HDL 47 >39 mg/dL   VLDL Cholesterol Cal 27 5 - 40 mg/dL   LDL Chol Calc (NIH) 61 0 - 99 mg/dL   Chol/HDL Ratio 2.9 0.0 - 5.0 ratio    Comment:                                   T. Chol/HDL Ratio                                             Men  Women                               1/2 Avg.Risk  3.4    3.3                                   Avg.Risk  5.0    4.4                                2X Avg.Risk  9.6    7.1                                3X Avg.Risk 23.4   11.0   Hepatic function panel     Status: None   Collection Time: 10/09/19  8:20 AM  Result Value Ref Range   Total Protein 6.8 6.0 - 8.5 g/dL   Albumin 4.1 3.8 - 4.8 g/dL   Bilirubin Total 0.5 0.0 - 1.2 mg/dL   Bilirubin, Direct 0.23 0.00 - 0.40 mg/dL   Alkaline Phosphatase 68 39 - 117 IU/L   AST 17 0 - 40 IU/L   ALT 26 0 - 44 IU/L    Objective: There were no vitals filed for this visit.  General: Patient is awake, alert, oriented x 3 and in no acute distress.  Dermatology: Skin is warm and dry bilateral with a hemorrhagic lesion noted to the right medial heel upon debridement there was a small wire filament that was removed from the skin and subcutaneous tissue from a pinpoint opening  at the right medial  aspect of the heel.  There is no surrounding redness warmth noted to the area.  There is generalized dryness to the heels bilateral with small amounts of fissures bilateral. No acute signs of infection.   Vascular: Dorsalis Pedis pulse = 1/4 Bilateral,  Posterior Tibial pulse = 1/4 Bilateral,  Capillary Fill Time < 5 seconds  Neurologic: Protective sensation intact using Semmes Weinstein monofilament however vibratory sensation is diminished bilateral.  Musculosketal: There is no pain to palpation to the heels and to foreign body area on right medial heel.  Strength within normal limits.  Patient refused a set of x-rays at this visit.  No results for input(s): GRAMSTAIN, LABORGA in the last 8760 hours.  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 -Patient refused a set of x-rays at this visit - Excisionally dedbrided keratotic lesion at the right medial heel and successfully removed a wire filament from the area.  Lesion/Wound was debrided to the level of the dermis with viable wound base exposed to promote healing. Hemostasis was achieved with manuel pressure. Patient tolerated procedure well without any discomfort or anesthesia necessary for this wound debridement.  -Applied antibiotic cream and Band-Aid and advised patient to do the same also gave patient a sample of foot miracle cream to apply to dry skin on heels as well and advised patient to remove Band-Aid at foreign body removal site at bedtime to assist with open to air healing  -Patient to continue with taking doxycycline as prescribed by PCP -Advised patient to refrain from walking barefoot -  Advised patient to go to the ER or return to office if the wound worsens or if constitutional symptoms are present. -Patient to return to office as scheduled for follow up wound check and evaluation or sooner if problems arise.  Landis Martins, DPM

## 2019-10-16 MED ORDER — APIXABAN 5 MG PO TABS: 5.0000 mg | ORAL_TABLET | Freq: Two times a day (BID) | ORAL | 2 refills | Status: DC

## 2019-10-16 NOTE — Telephone Encounter (Signed)
Eliquis refilled.  

## 2019-10-16 NOTE — Addendum Note (Signed)
Addended by: Ashok Norris on: 10/16/2019 06:33 PM   Modules accepted: Orders

## 2019-10-20 ENCOUNTER — Telehealth (HOSPITAL_COMMUNITY): Payer: Self-pay | Admitting: *Deleted

## 2019-10-20 ENCOUNTER — Telehealth: Payer: Self-pay | Admitting: Cardiology

## 2019-10-20 DIAGNOSIS — R942 Abnormal results of pulmonary function studies: Secondary | ICD-10-CM

## 2019-10-20 NOTE — Telephone Encounter (Signed)
Patient given detailed instructions per Myocardial Perfusion Study Information Sheet for the test on 10/27/19. Patient notified to arrive 15 minutes early and that it is imperative to arrive on time for appointment to keep from having the test rescheduled.  If you need to cancel or reschedule your appointment, please call the office within 24 hours of your appointment. . Patient verbalized understanding. Kirstie Peri

## 2019-10-20 NOTE — Telephone Encounter (Signed)
Patient calle dthis ma and his BP was high last night and feeling weak to his stomach and weak just in general, His bp was over 100 on the bottom and this is not the first time hehas felt this way. He is currently in AFIB. Please call him.

## 2019-10-20 NOTE — Telephone Encounter (Signed)
Please let me know in detail what his concerns are.  I can help him.

## 2019-10-21 NOTE — Addendum Note (Signed)
Addended by: Beckey Rutter on: 10/21/2019 05:28 PM   Modules accepted: Orders

## 2019-10-21 NOTE — Telephone Encounter (Signed)
Patient states he spoke with Dr. Rica Records and she put patient back on Entresto. Patient states he is feeling much better, fatigue and weakness are gone. RN notified patient that he is being referred to [facility] pulmonologist due to PFT results from [facility]. Patient is in agreement. No further questions at this time. Note routed to RRR for inclusion.

## 2019-10-27 ENCOUNTER — Ambulatory Visit (INDEPENDENT_AMBULATORY_CARE_PROVIDER_SITE_OTHER): Payer: Medicare Other

## 2019-10-27 ENCOUNTER — Other Ambulatory Visit: Payer: Self-pay

## 2019-10-27 VITALS — Ht 74.0 in | Wt 303.0 lb

## 2019-10-27 DIAGNOSIS — Z86711 Personal history of pulmonary embolism: Secondary | ICD-10-CM | POA: Diagnosis not present

## 2019-10-27 DIAGNOSIS — I4819 Other persistent atrial fibrillation: Secondary | ICD-10-CM

## 2019-10-27 DIAGNOSIS — I10 Essential (primary) hypertension: Secondary | ICD-10-CM

## 2019-10-27 MED ORDER — TECHNETIUM TC 99M TETROFOSMIN IV KIT
31.4000 | PACK | Freq: Once | INTRAVENOUS | Status: AC | PRN
  Administered 2019-10-27: 31.4 via INTRAVENOUS

## 2019-10-27 MED ORDER — REGADENOSON 0.4 MG/5ML IV SOLN
0.4000 mg | Freq: Once | INTRAVENOUS | Status: AC
  Administered 2019-10-27: 0.4 mg via INTRAVENOUS

## 2019-10-28 ENCOUNTER — Ambulatory Visit: Payer: Medicare Other

## 2019-10-28 LAB — MYOCARDIAL PERFUSION IMAGING
Peak HR: 90 {beats}/min
Rest HR: 75 {beats}/min
SDS: 0
SRS: 0
SSS: 0
TID: 1.19

## 2019-10-28 MED ORDER — TECHNETIUM TC 99M TETROFOSMIN IV KIT
32.5000 | PACK | Freq: Once | INTRAVENOUS | Status: AC | PRN
  Administered 2019-10-28: 32.5 via INTRAVENOUS

## 2019-10-29 ENCOUNTER — Ambulatory Visit (INDEPENDENT_AMBULATORY_CARE_PROVIDER_SITE_OTHER): Payer: Medicare Other

## 2019-10-29 ENCOUNTER — Ambulatory Visit (INDEPENDENT_AMBULATORY_CARE_PROVIDER_SITE_OTHER): Payer: Medicare Other | Admitting: Internal Medicine

## 2019-10-29 ENCOUNTER — Other Ambulatory Visit: Payer: Self-pay

## 2019-10-29 ENCOUNTER — Encounter: Payer: Self-pay | Admitting: Internal Medicine

## 2019-10-29 DIAGNOSIS — R0600 Dyspnea, unspecified: Secondary | ICD-10-CM

## 2019-10-29 DIAGNOSIS — R0609 Other forms of dyspnea: Secondary | ICD-10-CM

## 2019-10-29 HISTORY — DX: Other forms of dyspnea: R06.09

## 2019-10-29 HISTORY — DX: Dyspnea, unspecified: R06.00

## 2019-10-29 NOTE — Progress Notes (Signed)
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 Pulmonary Consult    Referred by Dr Lennox Pippins for eval of Dyspnea.    Dyspnea:  Fatigue > sob and light headed if stands up  quickly  Cough: none  Sleep: on back cpap 8 y prior to OV   SABA use: has not used yet  No obvious patterns in day to day or daytime variability of sob  or assoc excess/ purulent sputum or mucus plugs or hemoptysis or cp or chest tightness, subjective wheeze or overt sinus or hb symptoms.   Sleeping as above  without nocturnal  or early am exacerbation  of respiratory  c/o's or need for noct saba. Also denies any obvious fluctuation of symptoms with weather or environmental changes or other aggravating or alleviating factors except as outlined above   No unusual exposure hx or h/o childhood pna or knowledge of premature birth.  Current Allergies, Complete Past Medical History, Past Surgical History, Family History, and Social History were reviewed in [employer] record.  ROS  The following are not active complaints unless bolded Hoarseness, sore throat, dysphagia, dental problems, itching, sneezing,  nasal congestion or discharge of excess mucus or purulent secretions, ear ache,   fever, chills, sweats, unintended wt loss or wt gain, classically pleuritic or exertional cp,  orthopnea pnd  or arm/hand swelling  or leg swelling resolved , presyncope, palpitations, abdominal pain, anorexia, nausea, vomiting, diarrhea  or change in bowel habits or change in bladder habits, change in stools or change in urine, dysuria, hematuria,  rash, arthralgias, visual complaints, headache, numbness, weakness or ataxia or problems with walking or coordination,  change in mood or  memory.           Past Medical History:  Diagnosis Date  . Allergic rhinitis   . Arrhythmia   . Atrial fibrillation ([place])   . CHF (congestive heart failure) ([place])   . Diabetes mellitus without complication ([place])   . Hyperlipidemia   . Hypertension     Outpatient Medications Prior to Visit  Medication Sig Dispense Refill  . albuterol (PROVENTIL) (2.5 MG/3ML) 0.083% nebulizer solution Take 2.5 mg by nebulization 3 (three) times daily as needed for wheezing or shortness of breath.    Marland Kitchen albuterol (VENTOLIN HFA) 108 (90 Base) MCG/ACT inhaler Inhale 2 puffs into the lungs every 6 (six) hours as needed for wheezing or shortness of breath.    . allopurinol (ZYLOPRIM) 100 MG tablet Take 100 mg by mouth daily.    Marland Kitchen amiodarone (PACERONE) 400 MG tablet taking amiodarone 400 mg (1 tablet) once daily for one week, then go to 200 mg (0.5 tablet) once daily 30 tablet 4  . apixaban (ELIQUIS) 5 MG TABS tablet  Take 1 tablet (5 mg total) by mouth 2 (two) times daily. 60 tablet 2  . Ascorbic Acid (VITAMIN C) 1000 MG tablet Take 1,000 mg by mouth 2 (two) times daily.    Marland Kitchen atorvastatin (LIPITOR) 20 MG tablet Take 1 tablet (20 mg total) by mouth daily. 30 tablet 3  . B Complex-C (SUPER B COMPLEX PO) Take 400 mcg by mouth daily.    . Cholecalciferol (VITAMIN D3) 50 MCG (2000 UT) TABS Take 1 tablet by mouth 2 (two) times daily.    . empagliflozin (JARDIANCE) 25 MG TABS tablet Take 25 mg by mouth daily.    Marland Kitchen ENTRESTO 24-26 MG TAKE ONE TABLET BY MOUTH TWICE DAILY 60 tablet 5  . Ergocalciferol (VITAMIN D2 PO) Take by mouth.    . fluticasone  (FLONASE) 50 MCG/ACT nasal spray Place 2 sprays into both nostrils daily as needed for allergies or rhinitis.    . furosemide (LASIX) 40 MG tablet Take 40 mg by mouth daily.    . Insulin Glargine (TOUJEO SOLOSTAR [place]) Inject 90 Units into the skin at bedtime.    . metoprolol tartrate (LOPRESSOR) 25 MG tablet Take 25 mg by mouth 2 (two) times daily.    . montelukast (SINGULAIR) 10 MG tablet Take 10 mg by mouth at bedtime.    . Omega-3 Fatty Acids (FISH OIL) 1200 MG CAPS Take 1 capsule by mouth 2 (two) times daily.    Marland Kitchen omeprazole (PRILOSEC) 40 MG capsule Take 40 mg by mouth daily.    Marland Kitchen rOPINIRole (REQUIP) 2 MG tablet Take 2 mg by mouth at bedtime.    . tamsulosin (FLOMAX) 0.4 MG CAPS capsule Take 0.4 capsules by mouth 2 (two) times daily.    Marland Kitchen venlafaxine XR (EFFEXOR-XR) 150 MG 24 hr capsule Take 1 capsule by mouth daily.    . Vitamin D, Ergocalciferol, (DRISDOL) 1.25 MG (50000 UT) CAPS capsule Take 50,000 Units by mouth every 7 (seven) days.    Marland Kitchen spironolactone (ALDACTONE) 25 MG tablet Take 25 mg by mouth daily.    . traZODone (DESYREL) 100 MG tablet Take 100 mg by mouth at bedtime.        Objective:     BP 90/62 (BP Location: Left Arm, Cuff Size: Normal)   Pulse 75   Temp (!) 97.3 F (36.3 C) (Temporal)   Ht 6\' 2"  (1.88 m)   Wt (!) 302 lb 3.2 oz (137.1 kg)   SpO2 93%   BMI 38.80 kg/m   SpO2: 93 %  RA  amb wm nad    HEENT : pt wearing mask not removed for exam due to covid -19 concerns.    NECK :  without JVD/Nodes/TM/ nl carotid upstrokes bilaterally   LUNGS: no acc muscle use,  Nl contour chest which is clear to A and P bilaterally without cough on insp or exp maneuvers   CV:  slt irreg  no s3 or murmur or increase in P2, and no edema   ABD:  soft and nontender with nl inspiratory excursion in the supine position. No bruits or organomegaly appreciated, bowel sounds nl  MS:  Nl gait/ ext warm without deformities, calf tenderness, cyanosis or clubbing No obvious joint  restrictions   SKIN: warm and dry without lesions    NEURO:  alert, approp, nl sensorium with  no motor or cerebellar deficits apparent.    I personally reviewed images and agree with radiology impression as follows:  Chest CTa 11/29/2018 The examination is positive for pulmonary  emboli without evidence of right heart strain. Mild cardiomegaly. Hiatal hernia.   CXR PA and Lateral:   10/29/2019 :    I personally reviewed images and agree with radiology impression as follows:    No acute disease. Hiatal hernia.     Assessment   No problem-specific Assessment & Plan notes found for this encounter.     Christinia Gully, MD 10/30/2019

## 2019-10-29 NOTE — Patient Instructions (Signed)
We will request the pfts from [facility] lasix when light headed standing  Make sure you check your oxygen saturations at highest level of activity to be sure it stays over 90% and  Record it at least once a week and let me know if trending down  Please schedule a follow up office visit in 6 weeks, call sooner if needed

## 2019-10-30 ENCOUNTER — Encounter: Payer: Self-pay | Admitting: Internal Medicine

## 2019-10-30 NOTE — Assessment & Plan Note (Signed)
S/p PE  11/2018 > DOAC - onset variablt doe and orthostatic lightheadedness 06/2019 with afib - PFT's [facility]  - 10/29/2019   Walked RA x two laps =  approx 548ft @ avg pace - stopped due to end of study, no sob with sats of 96% at the end of the study.    Variable quality to doe is typical of pt with variable RAF esp given assoc light headedness but strongly against ILD, the most concern in a pt started on amiodarone.  [place],  Patients typically have been on amiodarone for 6-12 months before this complication manifests.  Of note, serial clinical evaluation for symptoms such as cough dyspnea or fevers is  the preferred method of monitoring for pulmonary toxicity because a decrease in DLCO or lung volumes is a nonspecific for toxicity. Pathologically amiodarone pulmonary toxicity may appear as interstitial pneumonitis, eosinophilic pneumonia, organizing pneumonia, pulmonary fibrosis or less commonly as diffuse alveolar hemorrhage, pulmonary nodules or pleural effusions.  Risk factors for pulmonary toxicity include age greater than 77, daily dose greater than equal to 400 mg, a high cumulative dose, or pre-existing lung disease (PE) so he does have 2/4 risk factors and needs to start monitoring sats at peak ex and f/u here in 3 m with repeat pfts, call sooner if losing any ground with ex tol or ex sats.   Discussed in detail all the  indications, usual  risks and alternatives  relative to the benefits with patient who agrees to proceed with Rx as outlined.       Total time devoted to counseling  > 50 % of initial 60 min office visit:  reviewed case with pt/  directly observed portions of ambulatory 02 saturation study/ discussion of options/alternatives/ personally creating written customized instructions  in presence of pt  then going over those specific  Instructions directly with the pt including how to use all of the meds but in particular covering each new medication in detail and the difference  between the maintenance= "automatic" meds and the prns using an action plan format for the latter (If this problem/symptom => do that organization reading Left to right).  Please see AVS from this visit for a full list of these instructions which I personally wrote for this pt and  are unique to this visit.

## 2019-11-02 ENCOUNTER — Other Ambulatory Visit: Payer: Self-pay

## 2019-11-02 ENCOUNTER — Encounter: Payer: Self-pay | Admitting: Cardiology

## 2019-11-02 ENCOUNTER — Ambulatory Visit (INDEPENDENT_AMBULATORY_CARE_PROVIDER_SITE_OTHER): Payer: Medicare Other | Admitting: Cardiology

## 2019-11-02 VITALS — BP 130/90 | HR 101 | Ht 74.0 in | Wt 311.0 lb

## 2019-11-02 DIAGNOSIS — E782 Mixed hyperlipidemia: Secondary | ICD-10-CM

## 2019-11-02 DIAGNOSIS — I10 Essential (primary) hypertension: Secondary | ICD-10-CM

## 2019-11-02 DIAGNOSIS — E088 Diabetes mellitus due to underlying condition with unspecified complications: Secondary | ICD-10-CM

## 2019-11-02 DIAGNOSIS — Z86711 Personal history of pulmonary embolism: Secondary | ICD-10-CM

## 2019-11-02 DIAGNOSIS — I4819 Other persistent atrial fibrillation: Secondary | ICD-10-CM | POA: Diagnosis not present

## 2019-11-02 NOTE — Patient Instructions (Addendum)
Medication Instructions:  Your physician recommends that you continue on your current medications as directed. Please refer to the Current Medication list given to you today.  *If you need a refill on your cardiac medications before your next appointment, please call your pharmacy*  Lab Work: Your physician recommends that you return 3 days prior to your procedure for CBC, hepatic and BMP to be drawn  If you have labs (blood work) drawn today and your tests are completely normal, you will receive your results only by: Marland Kitchen MyChart Message (if you have MyChart) OR . A paper copy in the mail If you have any lab test that is abnormal or we need to change your treatment, we will call you to review the results.  Testing/Procedures: You had an EKG Performed today  You are scheduled for a Cardioversion on 11/05/2019 with Dr. Agustin Cree.  Please arrive at the outpatient clinic of [facility]: 7 am.    DIET: Nothing to eat or drink after midnight except a sip of water with medications (see medication instructions below)  Medication Instructions:  Continue your anticoagulant: ELIQUIS AND Amiodarone of procedure You will need to continue your anticoagulant after your procedure until you  are told by your  Provider that it is safe to stop   Labs:NONE  You must have a responsible person to drive you home and stay in the waiting area during your procedure. Failure to do so could result in cancellation.  Bring your insurance cards.  *Special Note: Every effort is made to have your procedure done on time. Occasionally there are emergencies that occur at the hospital that may cause delays. Please be patient if a delay does occur.    Follow-Up: At [facility], you and your health needs are our priority.  As part of our continuing mission to provide you with exceptional heart care, we have created designated Provider Care Teams.  These Care Teams include your primary Cardiologist (physician)  and Advanced Practice Providers (APPs -  Physician Assistants and Nurse Practitioners) who all work together to provide you with the care you need, when you need it.  Your next appointment:   2 week(s)  The format for your next appointment:   In Person  Provider:   Jyl Heinz, MD  Other Instructions  Electrical Cardioversion Electrical cardioversion is the delivery of a jolt of electricity to restore a normal rhythm to the heart. A rhythm that is too fast or is not regular keeps the heart from pumping well. In this procedure, sticky patches or metal paddles are placed on the chest to deliver electricity to the heart from a device. This procedure may be done in an emergency if:  There is low or no blood pressure as a result of the heart rhythm.  Normal rhythm must be restored as fast as possible to protect the brain and heart from further damage.  It may save a life. This may also be a scheduled procedure for irregular or fast heart rhythms that are not immediately life-threatening. Tell a health care provider about:  Any allergies you have.  All medicines you are taking, including vitamins, herbs, eye drops, creams, and over-the-counter medicines.  Any problems you or family members have had with anesthetic medicines.  Any blood disorders you have.  Any surgeries you have had.  Any medical conditions you have.  Whether you are pregnant or may be pregnant. What are the risks? Generally, this is a safe procedure. However, problems may occur, including:  Allergic reactions to medicines.  A blood clot that breaks free and travels to other parts of your body.  The possible return of an abnormal heart rhythm within hours or days after the procedure.  Your heart stopping (cardiac arrest). This is rare. What happens before the procedure? Medicines  Your health care provider may have you start taking: ? Blood-thinning medicines (anticoagulants) so your blood does not  clot as easily. ? Medicines to help stabilize your heart rate and rhythm.  Ask your health care provider about: ? Changing or stopping your regular medicines. This is especially important if you are taking diabetes medicines or blood thinners. ? Taking medicines such as aspirin and ibuprofen. These medicines can thin your blood. Do not take these medicines unless your health care provider tells you to take them. ? Taking over-the-counter medicines, vitamins, herbs, and supplements. General instructions  Follow instructions from your health care provider about eating or drinking restrictions.  Plan to have someone take you home from the hospital or clinic.  If you will be going home right after the procedure, plan to have someone with you for 24 hours.  Ask your health care provider what steps will be taken to help prevent infection. These may include washing your skin with a germ-killing soap. What happens during the procedure?   An IV will be inserted into one of your veins.  Sticky patches (electrodes) or metal paddles may be placed on your chest.  You will be given a medicine to help you relax (sedative).  An electrical shock will be delivered. The procedure may vary among health care providers and hospitals. What can I expect after the procedure?  Your blood pressure, heart rate, breathing rate, and blood oxygen level will be monitored until you leave the hospital or clinic.  Your heart rhythm will be watched to make sure it does not change.  You may have some redness on the skin where the shocks were given. Follow these instructions at home:  Do not drive for 24 hours if you were given a sedative during your procedure.  Take over-the-counter and prescription medicines only as told by your health care provider.  Ask your health care provider how to check your pulse. Check it often.  Rest for 48 hours after the procedure or as told by your health care provider.  Avoid  or limit your caffeine use as told by your health care provider.  Keep all follow-up visits as told by your health care provider. This is important. Contact a health care provider if:  You feel like your heart is beating too quickly or your pulse is not regular.  You have a serious muscle cramp that does not go away. Get help right away if:  You have discomfort in your chest.  You are dizzy or you feel faint.  You have trouble breathing or you are short of breath.  Your speech is slurred.  You have trouble moving an arm or leg on one side of your body.  Your fingers or toes turn cold or blue. Summary  Electrical cardioversion is the delivery of a jolt of electricity to restore a normal rhythm to the heart.  This procedure may be done right away in an emergency or may be a scheduled procedure if the condition is not an emergency.  Generally, this is a safe procedure.  After the procedure, check your pulse often as told by your health care provider. This information is not intended to replace advice given  to you by your health care provider. Make sure you discuss any questions you have with your health care provider. Document Revised: 05/18/2019 Document Reviewed: 05/18/2019 Elsevier Patient Education  [place].

## 2019-11-02 NOTE — Progress Notes (Signed)
Spoke with pt and notified of results per Dr. Wert. Pt verbalized understanding and denied any questions. 

## 2019-11-02 NOTE — Progress Notes (Signed)
Cardiology Office Note:    Date:  11/02/2019   ID:  Sean Prude., DOB 1951/01/13, MRN [id number]  PCP:  [name], MD  Cardiologist:  Jenean Lindau, MD   Referring MD: [name], MD    ASSESSMENT:    1. Persistent atrial fibrillation ([place])   2. Essential hypertension   3. Mixed dyslipidemia   4. Diabetes mellitus due to underlying condition with unspecified complications ([place])   5. History of pulmonary embolism   6. Morbid obesity ([place])    PLAN:    In order of problems listed above:  1. Persistent atrial fibrillation:I discussed with the patient atrial fibrillation, disease process. Management and therapy including rate and rhythm control, anticoagulation benefits and potential risks were discussed extensively with the patient. Patient had multiple questions which were answered to patient's satisfaction.  Patient is on anticoagulation and I will schedule him for cardioversion.  Echocardiogram done at [facility] on 09/15/2019 revealed ejection fraction of 35 to 40% and left atrium which was moderately dilated.  The procedure, benefits and potential risks were explained to the patient and he vocalized understanding. 2. Essential hypertension: Blood pressure stable 3. History of pulmonary embolism: Patient on anticoagulation and takes it meticulously. 4. He has been instructed to take amiodarone and Eliquis without interruption.  He will take the dose of Eliquis on the morning of the procedure with a sip of water and the amiodarone also.  He will be seen in follow-up appointment after cardioversion.  He will have blood work today. 5. Total time for above review of management and discussion was 30 minutes.    Medication Adjustments/Labs and Tests Ordered: Current medicines are reviewed at length with the patient today.  Concerns regarding medicines are outlined above.  No orders of the defined types were placed in this encounter.  No orders of the defined types were  placed in this encounter.    Chief Complaint  Patient presents with  . Follow-up    1 Month Follow Up     History of Present Illness:    Sean Tzintzun. is a 69 y.o. male.  Patient has past medical history of atrial fibrillation, diabetes mellitus, essential hypertension and dyslipidemia.  He has had history of cardiomyopathy in the past and pulmonary embolism and has been committed to lifelong anticoagulation.  He mentions to me that overall he is feeling fine.  He says he has wheezing issues at times.  He also mentions to me that he feels a little unstable on his feet only at times.  He has never had a fall.  At the time of my evaluation, the patient is alert awake oriented and in no distress.  Past Medical History:  Diagnosis Date  . Allergic rhinitis   . Arrhythmia   . Atrial fibrillation ([place])   . CHF (congestive heart failure) ([place])   . Diabetes mellitus without complication ([place])   . Hyperlipidemia   . Hypertension     Past Surgical History:  Procedure Laterality Date  . CARDIAC CATHETERIZATION    . KNEE SURGERY    . PILONIDAL CYST / SINUS EXCISION      Current Medications: Current Meds  Medication Sig  . allopurinol (ZYLOPRIM) 100 MG tablet Take 100 mg by mouth daily.  Marland Kitchen amiodarone (PACERONE) 200 MG tablet Take 200 mg by mouth daily.   Marland Kitchen apixaban (ELIQUIS) 5 MG TABS tablet Take 1 tablet (5 mg total) by mouth 2 (two) times daily.  . Ascorbic Acid (VITAMIN  C) 1000 MG tablet Take 1,000 mg by mouth 2 (two) times daily.  Marland Kitchen atorvastatin (LIPITOR) 20 MG tablet Take 1 tablet (20 mg total) by mouth daily.  . B Complex-C (SUPER B COMPLEX PO) Take 400 mcg by mouth daily.  . Cholecalciferol (VITAMIN D3) 50 MCG (2000 UT) TABS Take 1 tablet by mouth 2 (two) times daily.  . empagliflozin (JARDIANCE) 25 MG TABS tablet Take 25 mg by mouth daily.  Marland Kitchen ENTRESTO 24-26 MG TAKE ONE TABLET BY MOUTH TWICE DAILY  . Ergocalciferol (VITAMIN D2 PO) Take by mouth.  . fluticasone (FLONASE) 50  MCG/ACT nasal spray Place 2 sprays into both nostrils daily as needed for allergies or rhinitis.  . Insulin Glargine (TOUJEO SOLOSTAR [place]) Inject 90 Units into the skin at bedtime.  . metoprolol tartrate (LOPRESSOR) 25 MG tablet Take 25 mg by mouth 2 (two) times daily.  . montelukast (SINGULAIR) 10 MG tablet Take 10 mg by mouth at bedtime.  . Omega-3 Fatty Acids (FISH OIL) 1200 MG CAPS Take 1 capsule by mouth 2 (two) times daily.  Marland Kitchen omeprazole (PRILOSEC) 40 MG capsule Take 40 mg by mouth daily.  Marland Kitchen rOPINIRole (REQUIP) 2 MG tablet Take 2 mg by mouth at bedtime.  . tamsulosin (FLOMAX) 0.4 MG CAPS capsule Take 0.4 capsules by mouth 2 (two) times daily.  Marland Kitchen venlafaxine XR (EFFEXOR-XR) 150 MG 24 hr capsule Take 1 capsule by mouth daily.  . Vitamin D, Ergocalciferol, (DRISDOL) 1.25 MG (50000 UT) CAPS capsule Take 50,000 Units by mouth every 7 (seven) days.  Marland Kitchen zolpidem (AMBIEN) 5 MG tablet Take 5 mg by mouth at bedtime as needed for sleep.     Allergies:   Prednisone   Social History   Socioeconomic History  . Marital status: Married    Spouse name: Not on file  . Number of children: Not on file  . Years of education: Not on file  . Highest education level: Not on file  Occupational History  . Not on file  Tobacco Use  . Smoking status: Never Smoker  . Smokeless tobacco: Never Used  Substance and Sexual Activity  . Alcohol use: Yes    Comment: socially  . Drug use: Never  . Sexual activity: Not on file  Other Topics Concern  . Not on file  Social History Narrative  . Not on file   Social Determinants of Health   Financial Resource Strain:   . Difficulty of Paying Living Expenses: Not on file  Food Insecurity:   . Worried About Charity fundraiser in the Last Year: Not on file  . Ran Out of Food in the Last Year: Not on file  Transportation Needs:   . Lack of Transportation (Medical): Not on file  . Lack of Transportation (Non-Medical): Not on file  Physical Activity:   . Days  of Exercise per Week: Not on file  . Minutes of Exercise per Session: Not on file  Stress:   . Feeling of Stress : Not on file  Social Connections:   . Frequency of Communication with Friends and Family: Not on file  . Frequency of Social Gatherings with Friends and Family: Not on file  . Attends Religious Services: Not on file  . Active Member of Clubs or Organizations: Not on file  . Attends Archivist Meetings: Not on file  . Marital Status: Not on file     Family History: The patient's family history includes Hyperlipidemia in his father; Hypertension in his  father.  ROS:   Please see the history of present illness.    All other systems reviewed and are negative.  EKGs/Labs/Other Studies Reviewed:    The following studies were reviewed today: EKG reveals atrial fibrillation with elevated ventricular rate   Recent Labs: 09/29/2019: TSH 1.340 10/09/2019: ALT 26; BUN 27; Creatinine, Ser 1.61; Hemoglobin 14.1; Platelets 188; Potassium 4.7; Sodium 141  Recent Lipid Panel    Component Value Date/Time   CHOL 135 10/09/2019 0820   TRIG 159 (H) 10/09/2019 0820   HDL 47 10/09/2019 0820   CHOLHDL 2.9 10/09/2019 0820   LDLCALC 61 10/09/2019 0820    Physical Exam:    VS:  BP 130/90   Pulse (!) 101   Ht 6\' 2"  (1.88 m)   Wt (!) 311 lb (141.1 kg)   SpO2 96%   BMI 39.93 kg/m     Wt Readings from Last 3 Encounters:  11/02/19 (!) 311 lb (141.1 kg)  10/29/19 (!) 302 lb 3.2 oz (137.1 kg)  10/27/19 (!) 303 lb (137.4 kg)     GEN: Patient is in no acute distress HEENT: Normal NECK: No JVD; No carotid bruits LYMPHATICS: No lymphadenopathy CARDIAC: Hear sounds regular, 2/6 systolic murmur at the apex. RESPIRATORY:  Clear to auscultation without rales, wheezing or rhonchi  ABDOMEN: Soft, non-tender, non-distended MUSCULOSKELETAL:  No edema; No deformity  SKIN: Warm and dry NEUROLOGIC:  Alert and oriented x 3 PSYCHIATRIC:  Normal affect   Signed, Jenean Lindau,  MD  11/02/2019 3:00 PM    [facility]

## 2019-11-03 ENCOUNTER — Encounter: Payer: Self-pay | Admitting: *Deleted

## 2019-11-03 LAB — BASIC METABOLIC PANEL
BUN/Creatinine Ratio: 13 (ref 10–24)
BUN: 15 mg/dL (ref 8–27)
CO2: 21 mmol/L (ref 20–29)
Calcium: 9.3 mg/dL (ref 8.6–10.2)
Chloride: 105 mmol/L (ref 96–106)
Creatinine, Ser: 1.13 mg/dL (ref 0.76–1.27)
GFR calc Af Amer: 76 mL/min/{1.73_m2} (ref >59)
GFR calc non Af Amer: 65 mL/min/{1.73_m2} (ref >59)
Glucose: 136 mg/dL — ABNORMAL HIGH (ref 65–99)
Potassium: 4.5 mmol/L (ref 3.5–5.2)
Sodium: 140 mmol/L (ref 134–144)

## 2019-11-03 LAB — CBC
Hematocrit: 43 % (ref 37.5–51.0)
Hemoglobin: 14.3 g/dL (ref 13.0–17.7)
MCH: 30 pg (ref 26.6–33.0)
MCHC: 33.3 g/dL (ref 31.5–35.7)
MCV: 90 fL (ref 79–97)
Platelets: 190 10*3/uL (ref 150–450)
RBC: 4.77 x10E6/uL (ref 4.14–5.80)
RDW: 18 % — ABNORMAL HIGH (ref 11.6–15.4)
WBC: 8.5 10*3/uL (ref 3.4–10.8)

## 2019-11-03 LAB — HEPATIC FUNCTION PANEL
ALT: 33 IU/L (ref 0–44)
AST: 22 IU/L (ref 0–40)
Albumin: 4 g/dL (ref 3.8–4.8)
Alkaline Phosphatase: 80 IU/L (ref 39–117)
Bilirubin Total: 0.5 mg/dL (ref 0.0–1.2)
Bilirubin, Direct: 0.18 mg/dL (ref 0.00–0.40)
Total Protein: 6.8 g/dL (ref 6.0–8.5)

## 2019-11-05 ENCOUNTER — Ambulatory Visit: Payer: Medicare Other | Admitting: Sports Medicine

## 2019-11-05 DIAGNOSIS — G4733 Obstructive sleep apnea (adult) (pediatric): Secondary | ICD-10-CM | POA: Diagnosis not present

## 2019-11-05 DIAGNOSIS — E782 Mixed hyperlipidemia: Secondary | ICD-10-CM | POA: Diagnosis not present

## 2019-11-05 DIAGNOSIS — I10 Essential (primary) hypertension: Secondary | ICD-10-CM | POA: Diagnosis not present

## 2019-11-05 DIAGNOSIS — Z7901 Long term (current) use of anticoagulants: Secondary | ICD-10-CM | POA: Diagnosis not present

## 2019-11-05 DIAGNOSIS — E119 Type 2 diabetes mellitus without complications: Secondary | ICD-10-CM | POA: Diagnosis not present

## 2019-11-05 DIAGNOSIS — I509 Heart failure, unspecified: Secondary | ICD-10-CM | POA: Diagnosis not present

## 2019-11-05 DIAGNOSIS — J302 Other seasonal allergic rhinitis: Secondary | ICD-10-CM | POA: Diagnosis not present

## 2019-11-05 DIAGNOSIS — H9193 Unspecified hearing loss, bilateral: Secondary | ICD-10-CM | POA: Diagnosis not present

## 2019-11-05 DIAGNOSIS — I4891 Unspecified atrial fibrillation: Secondary | ICD-10-CM | POA: Diagnosis not present

## 2019-11-05 DIAGNOSIS — Z86711 Personal history of pulmonary embolism: Secondary | ICD-10-CM | POA: Diagnosis not present

## 2019-11-05 DIAGNOSIS — Z9989 Dependence on other enabling machines and devices: Secondary | ICD-10-CM | POA: Diagnosis not present

## 2019-11-05 DIAGNOSIS — E118 Type 2 diabetes mellitus with unspecified complications: Secondary | ICD-10-CM | POA: Diagnosis not present

## 2019-11-05 DIAGNOSIS — I4819 Other persistent atrial fibrillation: Secondary | ICD-10-CM | POA: Diagnosis not present

## 2019-11-05 DIAGNOSIS — M1990 Unspecified osteoarthritis, unspecified site: Secondary | ICD-10-CM | POA: Diagnosis not present

## 2019-11-05 DIAGNOSIS — I110 Hypertensive heart disease with heart failure: Secondary | ICD-10-CM | POA: Diagnosis not present

## 2019-11-05 DIAGNOSIS — I480 Paroxysmal atrial fibrillation: Secondary | ICD-10-CM | POA: Diagnosis not present

## 2019-11-05 DIAGNOSIS — Z8709 Personal history of other diseases of the respiratory system: Secondary | ICD-10-CM | POA: Diagnosis not present

## 2019-11-05 DIAGNOSIS — Z862 Personal history of diseases of the blood and blood-forming organs and certain disorders involving the immune mechanism: Secondary | ICD-10-CM | POA: Diagnosis not present

## 2019-11-05 DIAGNOSIS — E785 Hyperlipidemia, unspecified: Secondary | ICD-10-CM | POA: Diagnosis not present

## 2019-11-11 DIAGNOSIS — K219 Gastro-esophageal reflux disease without esophagitis: Secondary | ICD-10-CM | POA: Diagnosis not present

## 2019-11-11 DIAGNOSIS — R195 Other fecal abnormalities: Secondary | ICD-10-CM | POA: Diagnosis not present

## 2019-11-11 DIAGNOSIS — K921 Melena: Secondary | ICD-10-CM | POA: Diagnosis not present

## 2019-11-12 DIAGNOSIS — Z86711 Personal history of pulmonary embolism: Secondary | ICD-10-CM | POA: Diagnosis not present

## 2019-11-12 DIAGNOSIS — I110 Hypertensive heart disease with heart failure: Secondary | ICD-10-CM | POA: Diagnosis not present

## 2019-11-12 DIAGNOSIS — I4891 Unspecified atrial fibrillation: Secondary | ICD-10-CM | POA: Diagnosis not present

## 2019-11-12 DIAGNOSIS — K2970 Gastritis, unspecified, without bleeding: Secondary | ICD-10-CM | POA: Diagnosis not present

## 2019-11-12 DIAGNOSIS — K219 Gastro-esophageal reflux disease without esophagitis: Secondary | ICD-10-CM | POA: Diagnosis not present

## 2019-11-12 DIAGNOSIS — K254 Chronic or unspecified gastric ulcer with hemorrhage: Secondary | ICD-10-CM | POA: Diagnosis not present

## 2019-11-12 DIAGNOSIS — D649 Anemia, unspecified: Secondary | ICD-10-CM | POA: Diagnosis not present

## 2019-11-12 DIAGNOSIS — K449 Diaphragmatic hernia without obstruction or gangrene: Secondary | ICD-10-CM | POA: Diagnosis not present

## 2019-11-12 DIAGNOSIS — Z79899 Other long term (current) drug therapy: Secondary | ICD-10-CM | POA: Diagnosis not present

## 2019-11-12 DIAGNOSIS — J45909 Unspecified asthma, uncomplicated: Secondary | ICD-10-CM | POA: Diagnosis not present

## 2019-11-12 DIAGNOSIS — I509 Heart failure, unspecified: Secondary | ICD-10-CM | POA: Diagnosis not present

## 2019-11-12 DIAGNOSIS — R195 Other fecal abnormalities: Secondary | ICD-10-CM | POA: Diagnosis not present

## 2019-11-12 DIAGNOSIS — E119 Type 2 diabetes mellitus without complications: Secondary | ICD-10-CM | POA: Diagnosis not present

## 2019-11-13 DIAGNOSIS — Z6841 Body Mass Index (BMI) 40.0 and over, adult: Secondary | ICD-10-CM | POA: Diagnosis not present

## 2019-11-13 DIAGNOSIS — L89619 Pressure ulcer of right heel, unspecified stage: Secondary | ICD-10-CM | POA: Diagnosis not present

## 2019-11-13 DIAGNOSIS — I10 Essential (primary) hypertension: Secondary | ICD-10-CM | POA: Diagnosis not present

## 2019-11-16 DIAGNOSIS — K921 Melena: Secondary | ICD-10-CM | POA: Diagnosis not present

## 2019-11-16 DIAGNOSIS — D649 Anemia, unspecified: Secondary | ICD-10-CM | POA: Diagnosis not present

## 2019-11-24 ENCOUNTER — Other Ambulatory Visit: Payer: Self-pay

## 2019-11-24 ENCOUNTER — Ambulatory Visit (INDEPENDENT_AMBULATORY_CARE_PROVIDER_SITE_OTHER): Payer: Medicare Other | Admitting: Cardiology

## 2019-11-24 ENCOUNTER — Encounter: Payer: Self-pay | Admitting: Cardiology

## 2019-11-24 VITALS — BP 132/80 | HR 86 | Ht 74.0 in | Wt 315.0 lb

## 2019-11-24 DIAGNOSIS — I4819 Other persistent atrial fibrillation: Secondary | ICD-10-CM

## 2019-11-24 DIAGNOSIS — E782 Mixed hyperlipidemia: Secondary | ICD-10-CM

## 2019-11-24 DIAGNOSIS — Z86711 Personal history of pulmonary embolism: Secondary | ICD-10-CM | POA: Diagnosis not present

## 2019-11-24 DIAGNOSIS — E088 Diabetes mellitus due to underlying condition with unspecified complications: Secondary | ICD-10-CM | POA: Diagnosis not present

## 2019-11-24 DIAGNOSIS — I10 Essential (primary) hypertension: Secondary | ICD-10-CM | POA: Diagnosis not present

## 2019-11-24 NOTE — Progress Notes (Signed)
Cardiology Office Note:    Date:  11/24/2019   ID:  Sean Prude., DOB 09-18-1951, MRN [id number]  PCP:  [name], MD  Cardiologist:  Jenean Lindau, MD   Referring MD: [name], MD    ASSESSMENT:    1. Persistent atrial fibrillation ([place])   2. Essential hypertension   3. Mixed dyslipidemia   4. Diabetes mellitus due to underlying condition with unspecified complications ([place])   5. History of pulmonary embolism   6. Morbid obesity ([place])    PLAN:    In order of problems listed above:  1. Persistent atrial fibrillation: Patient has failed cardioversion. He is on amiodarone therapy. His echocardiogram reveals an ejection fraction of 35 to 40%. Last year in February it was within normal limits. In view of this we want to aggressively pursue rhythm control. I discussed this with him at extensive length and would like him to see our partner Dr. Curt Bears for this evaluation. Patient is agreeable and will continue anticoagulation. Benefits and potential risks explained and he vocalized understanding. 2. Essential hypertension: Blood pressure is stable 3. Cardiomyopathy: Newly diagnosed on echocardiogram.. Ejection fraction at [facility] was mentioned to be 35 to 40% in November 2020. Patient is on Entresto. We will continue current medications. 4. Patient will be seen in follow-up appointment in 3 months or earlier if the patient has any concerns    Medication Adjustments/Labs and Tests Ordered: Current medicines are reviewed at length with the patient today.  Concerns regarding medicines are outlined above.  No orders of the defined types were placed in this encounter.  No orders of the defined types were placed in this encounter.    Chief Complaint  Patient presents with  . Follow-up    3 Weeks     History of Present Illness:    Sean Beach[place]. is a 69 y.o. male. Patient has past medical history of atrial fibrillation. He is post cardioversion and this has  failed. He is into atrial fibrillation with well-controlled ventricular rate. He also mentions to me that his blood sugar was low recently and went up to 60 or so. He is feeling fine he denies any palpitations or any such issues. He had blood in his rectum and was evaluated by Dr. Lyda Jester or local gastroenterology specialist and was told that there was nothing significant on the evaluation with endoscopy and he was told to continue anticoagulation. Subsequently the patient has had no such problems. At the time of my evaluation, the patient is alert awake oriented and in no distress.  Past Medical History:  Diagnosis Date  . Allergic rhinitis   . Arrhythmia   . Atrial fibrillation ([place])   . CHF (congestive heart failure) ([place])   . Diabetes mellitus without complication ([place])   . Hyperlipidemia   . Hypertension     Past Surgical History:  Procedure Laterality Date  . CARDIAC CATHETERIZATION    . KNEE SURGERY    . PILONIDAL CYST / SINUS EXCISION      Current Medications: Current Meds  Medication Sig  . allopurinol (ZYLOPRIM) 100 MG tablet Take 100 mg by mouth daily.  Marland Kitchen amiodarone (PACERONE) 200 MG tablet Take 200 mg by mouth daily.   Marland Kitchen apixaban (ELIQUIS) 5 MG TABS tablet Take 1 tablet (5 mg total) by mouth 2 (two) times daily.  . Ascorbic Acid (VITAMIN C) 1000 MG tablet Take 1,000 mg by mouth 2 (two) times daily.  Marland Kitchen atorvastatin (LIPITOR) 20 MG tablet Take  1 tablet (20 mg total) by mouth daily.  . B Complex-C (SUPER B COMPLEX PO) Take 400 mcg by mouth daily.  . Cholecalciferol (VITAMIN D3) 50 MCG (2000 UT) TABS Take 1 tablet by mouth 2 (two) times daily.  . empagliflozin (JARDIANCE) 25 MG TABS tablet Take 25 mg by mouth daily.  Marland Kitchen ENTRESTO 24-26 MG TAKE ONE TABLET BY MOUTH TWICE DAILY  . Ergocalciferol (VITAMIN D2 PO) Take by mouth.  . fluticasone (FLONASE) 50 MCG/ACT nasal spray Place 2 sprays into both nostrils daily as needed for allergies or rhinitis.  . Insulin Glargine (TOUJEO  SOLOSTAR [place]) Inject 90 Units into the skin at bedtime.  . metoprolol tartrate (LOPRESSOR) 25 MG tablet Take 25 mg by mouth 2 (two) times daily.  . montelukast (SINGULAIR) 10 MG tablet Take 10 mg by mouth at bedtime.  . Omega-3 Fatty Acids (FISH OIL) 1200 MG CAPS Take 1 capsule by mouth 2 (two) times daily.  Marland Kitchen omeprazole (PRILOSEC) 40 MG capsule Take 40 mg by mouth 2 (two) times daily.   Marland Kitchen rOPINIRole (REQUIP) 2 MG tablet Take 2 mg by mouth at bedtime.  . tamsulosin (FLOMAX) 0.4 MG CAPS capsule Take 0.4 capsules by mouth 2 (two) times daily.  Marland Kitchen venlafaxine XR (EFFEXOR-XR) 150 MG 24 hr capsule Take 1 capsule by mouth daily.  . Vitamin D, Ergocalciferol, (DRISDOL) 1.25 MG (50000 UT) CAPS capsule Take 50,000 Units by mouth every 7 (seven) days.  Marland Kitchen zolpidem (AMBIEN) 5 MG tablet Take 5 mg by mouth at bedtime as needed for sleep.     Allergies:   Prednisone   Social History   Socioeconomic History  . Marital status: Married    Spouse name: Not on file  . Number of children: Not on file  . Years of education: Not on file  . Highest education level: Not on file  Occupational History  . Not on file  Tobacco Use  . Smoking status: Never Smoker  . Smokeless tobacco: Never Used  Substance and Sexual Activity  . Alcohol use: Yes    Comment: socially  . Drug use: Never  . Sexual activity: Not on file  Other Topics Concern  . Not on file  Social History Narrative  . Not on file   Social Determinants of Health   Financial Resource Strain:   . Difficulty of Paying Living Expenses: Not on file  Food Insecurity:   . Worried About Charity fundraiser in the Last Year: Not on file  . Ran Out of Food in the Last Year: Not on file  Transportation Needs:   . Lack of Transportation (Medical): Not on file  . Lack of Transportation (Non-Medical): Not on file  Physical Activity:   . Days of Exercise per Week: Not on file  . Minutes of Exercise per Session: Not on file  Stress:   . Feeling of  Stress : Not on file  Social Connections:   . Frequency of Communication with Friends and Family: Not on file  . Frequency of Social Gatherings with Friends and Family: Not on file  . Attends Religious Services: Not on file  . Active Member of Clubs or Organizations: Not on file  . Attends Archivist Meetings: Not on file  . Marital Status: Not on file     Family History: The patient's family history includes Hyperlipidemia in his father; Hypertension in his father.  ROS:   Please see the history of present illness.    All other  systems reviewed and are negative.  EKGs/Labs/Other Studies Reviewed:    The following studies were reviewed today: Study Highlights    There was no ST segment deviation noted during stress.  No T wave inversion was noted during stress.  This is a non-gated study due to atrial fibrillation.  The study is normal.  This is a low risk study.        Recent Labs: 09/29/2019: TSH 1.340 11/02/2019: ALT 33; BUN 15; Creatinine, Ser 1.13; Hemoglobin 14.3; Platelets 190; Potassium 4.5; Sodium 140  Recent Lipid Panel    Component Value Date/Time   CHOL 135 10/09/2019 0820   TRIG 159 (H) 10/09/2019 0820   HDL 47 10/09/2019 0820   CHOLHDL 2.9 10/09/2019 0820   LDLCALC 61 10/09/2019 0820    Physical Exam:    VS:  BP 132/80   Pulse 86   Ht 6\' 2"  (1.88 m)   Wt (!) 315 lb (142.9 kg)   SpO2 94%   BMI 40.44 kg/m     Wt Readings from Last 3 Encounters:  11/24/19 (!) 315 lb (142.9 kg)  11/02/19 (!) 311 lb (141.1 kg)  10/29/19 (!) 302 lb 3.2 oz (137.1 kg)     GEN: Patient is in no acute distress HEENT: Normal NECK: No JVD; No carotid bruits LYMPHATICS: No lymphadenopathy CARDIAC: Hear sounds regular, 2/6 systolic murmur at the apex. RESPIRATORY:  Clear to auscultation without rales, wheezing or rhonchi  ABDOMEN: Soft, non-tender, non-distended MUSCULOSKELETAL:  No edema; No deformity  SKIN: Warm and dry NEUROLOGIC:  Alert and  oriented x 3 PSYCHIATRIC:  Normal affect   Signed, Jenean Lindau, MD  11/24/2019 3:05 PM    [facility]

## 2019-11-24 NOTE — Patient Instructions (Signed)
Medication Instructions:  Your physician recommends that you continue on your current medications as directed. Please refer to the Current Medication list given to you today.  *If you need a refill on your cardiac medications before your next appointment, please call your pharmacy*  Lab Work: NONE If you have labs (blood work) drawn today and your tests are completely normal, you will receive your results only by: Marland Kitchen MyChart Message (if you have MyChart) OR . A paper copy in the mail If you have any lab test that is abnormal or we need to change your treatment, we will call you to review the results.  Testing/Procedures: You had an EKG performed today  Your physician has recommended that you have an EP Study. This test is used to assess serious arrhythmias (irregular heartbeats). During an Electro-physiology Study (EPS), a thin, flexible wire is passed through a vein in your groin (upper thigh) or neck up to the heart. The wire records the heart's electrical signals. Your doctor uses the wire to electrically stimulate your heart and trigger an arrhythmic. This allows the doctor to see whether an antiarrhythmia medicine can help manage the problem or if further procedures are necessary (i.e., ablation/ICD). Radiofrequency ablation, a procedure used to fix some types of arrthythmia, may be done during an EPS. This is done in the hospital and often requires an overnight stay. Please see the instruction sheet given to your today for more information.   Follow-Up: At [facility], you and your health needs are our priority.  As part of our continuing mission to provide you with exceptional heart care, we have created designated Provider Care Teams.  These Care Teams include your primary Cardiologist (physician) and Advanced Practice Providers (APPs -  Physician Assistants and Nurse Practitioners) who all work together to provide you with the care you need, when you need it.  Your next appointment:     3 month(s)  The format for your next appointment:   In Person  Provider:   Jyl Heinz, MD  Other Instructions  Electrophysiology Study An electrophysiology (EP) study is a heart test to check how well the heart's electrical conduction system is working. The electrical conduction system uses electrical signals to make the heart beat. In this study, thin, flexible tubes (catheters) are placed in a large vein in your groin, arm, neck, or chest. A person may need this test if he or she has:  Dizziness or fainting.  An abnormal heart rhythm (arrhythmia), such as: ? A fast heartbeat (tachycardia). ? A slow heartbeat (bradycardia). ? An irregular heartbeat, such as atrial fibrillation. Tell a health care provider about:  Any allergies you have.  All medicines you are taking, including vitamins, herbs, eye drops, creams, and over-the-counter medicines.  Any problems you or family members have had with anesthetic medicines.  Any blood disorders you have.  Any surgeries you have had.  Any medical conditions you have or have had.  Whether you are pregnant or may be pregnant. What are the risks? Generally, this is a safe procedure. However, problems may occur, including:  Tachycardia that does not go away.  Bleeding or bruising around the insertion site.  Infection.  Temporary or permanent problems of the heart rhythm.  Temporary changes in blood pressure.  Puncture (perforation) of the heart wall or a blood vessel. This can cause bleeding and pressure between the heart and the sac that surrounds it (cardiac tamponade).  Severe heart problems, such as: ? Cardiac arrest. This is when  the heart stops beating. ? Life-threatening arrhythmia.  Allergic reactions to medicines or dyes.  Damage to nearby structures or organs. What happens before the procedure? Staying hydrated Follow instructions from your health care provider about hydration, which may include:  Up to  2 hours before the procedure - you may continue to drink clear liquids, such as water, clear fruit juice, black coffee, and plain tea.  Eating and drinking restrictions Follow instructions from your health care provider about eating and drinking, which may include:  8 hours before the procedure - stop eating heavy meals or foods, such as meat, fried foods, or fatty foods.  6 hours before the procedure - stop eating light meals or foods, such as toast or cereal.  6 hours before the procedure - stop drinking milk or drinks that contain milk.  2 hours before the procedure - stop drinking clear liquids. Medicines Ask your health care provider about:  Changing or stopping your regular medicines. This is especially important if you are taking diabetes medicines or blood thinners.  Taking medicines such as aspirin and ibuprofen. These medicines can thin your blood. Do not take these medicines unless your health care provider tells you to take them.  Taking over-the-counter medicines, vitamins, herbs, and supplements. Surgery safety Ask your health care provider:  How your surgery site will be marked.  What steps will be taken to help prevent infection. These may include: ? Removing hair at the surgery site. ? Washing skin with a germ-killing soap. ? Taking antibiotic medicine. General instructions  Do not use any products that contain nicotine or tobacco for at least 4 weeks before the procedure. These products include cigarettes, e-cigarettes, and chewing tobacco. If you need help quitting, ask your health care provider.  Plan to have someone take you home from the hospital or clinic.  If you will be going home right after the procedure, plan to have someone with you for 24 hours. What happens during the procedure?   An IV will be inserted into one of your veins.  You will be given one or more of the following: ? A medicine to help you relax (sedative). ? A medicine to numb the  area (local anesthetic). ? A medicine to make you fall asleep (general anesthetic).  Catheters with an electrode tip will be inserted into a large vein. These electrode tips can measure the heart's electrical activity. They can also use electrical signals to change the heart rhythm.  The catheters will be guided to the heart using a type of X-ray machine (fluoroscopy). Once the catheters are in the heart, they will evaluate the electrical activity of your heart.  If you are awake during the EP study, you may feel dizzy or light-headed. Your heart rate may temporarily increase, or you may feel your heart beating hard. Tell your health care provider if you experience these things during the EP study: ? You feel dizzy or nauseous. ? You have chest pain or pressure.  The catheters will be removed.  Firm pressure will be applied to the insertion site to prevent bleeding.  A bandage (dressing) may be applied over the insertion site. The procedure may vary among health care providers and hospitals. What happens after the procedure?   Your blood pressure, heart rate, breathing rate, and blood oxygen level will be monitored until you leave the hospital or clinic.  If you were given a sedative during the procedure, it can affect you for several hours. Do not drive or  operate machinery until your health care provider says that it is safe.  You will need to lie flat for a few hours or as told by your health care provider. Keep your legs straight. Do not bend or cross your legs.  It is up to you to get the results of your procedure. Ask your health care provider, or the department that is doing the procedure, when your results will be ready. Summary  An electrophysiology (EP) study is a heart test to check how well the heart's electrical system is working.  This is generally a safe procedure. However, heart problems, infection, puncture of the heart, or bleeding around the insertion site may  occur.  Follow your health care provider's instructions about eating and drinking restrictions and stopping or changing medicines before the procedure.  After the procedure, you will need to lie flat for a few hours or as told by your health care provider. This information is not intended to replace advice given to you by your health care provider. Make sure you discuss any questions you have with your health care provider. Document Revised: 07/09/2019 Document Reviewed: 07/09/2019 Elsevier Patient Education  [employer].

## 2019-11-25 DIAGNOSIS — K921 Melena: Secondary | ICD-10-CM | POA: Diagnosis not present

## 2019-11-25 DIAGNOSIS — D649 Anemia, unspecified: Secondary | ICD-10-CM | POA: Diagnosis not present

## 2019-11-30 ENCOUNTER — Other Ambulatory Visit: Payer: Self-pay

## 2019-11-30 ENCOUNTER — Ambulatory Visit (INDEPENDENT_AMBULATORY_CARE_PROVIDER_SITE_OTHER): Payer: Medicare Other | Admitting: Cardiology

## 2019-11-30 ENCOUNTER — Ambulatory Visit: Payer: Medicare Other | Admitting: Internal Medicine

## 2019-11-30 ENCOUNTER — Encounter: Payer: Self-pay | Admitting: Cardiology

## 2019-11-30 VITALS — BP 122/76 | HR 83 | Ht 74.0 in | Wt 313.0 lb

## 2019-11-30 DIAGNOSIS — I4819 Other persistent atrial fibrillation: Secondary | ICD-10-CM | POA: Diagnosis not present

## 2019-11-30 DIAGNOSIS — Z01812 Encounter for preprocedural laboratory examination: Secondary | ICD-10-CM | POA: Diagnosis not present

## 2019-11-30 DIAGNOSIS — Z01810 Encounter for preprocedural cardiovascular examination: Secondary | ICD-10-CM | POA: Diagnosis not present

## 2019-11-30 DIAGNOSIS — K2970 Gastritis, unspecified, without bleeding: Secondary | ICD-10-CM | POA: Diagnosis not present

## 2019-11-30 DIAGNOSIS — K219 Gastro-esophageal reflux disease without esophagitis: Secondary | ICD-10-CM | POA: Diagnosis not present

## 2019-11-30 NOTE — Patient Instructions (Signed)
Medication Instructions:  Your physician recommends that you continue on your current medications as directed. Please refer to the Current Medication list given to you today.  *If you need a refill on your cardiac medications before your next appointment, please call your pharmacy.  Labwork: You will get lab work within 30 days of your procedure:  BMP & CBC If you have labs (blood work) drawn today and your tests are completely normal, you will receive your results only by:  [place] (if you have MyChart) OR  A paper copy in the mail If you have any lab test that is abnormal or we need to change your treatment, we will call you to review the results.  Testing/Procedures: Your physician has requested that you have cardiac CT within 7 days prior to your ablation. Cardiac computed tomography (CT) is a painless test that uses an x-ray machine to take clear, detailed pictures of your heart. For further information please visit [email or web address]. Please follow instruction below located under special instructions. You will get a call from our office to schedule the date for this test.  Your physician has recommended that you have an ablation. Catheter ablation is a medical procedure used to treat some cardiac arrhythmias (irregular heartbeats). During catheter ablation, a long, thin, flexible tube is put into a blood vessel in your groin (upper thigh), or neck. This tube is called an ablation catheter. It is then guided to your heart through the blood vessel. Radio frequency waves destroy small areas of heart tissue where abnormal heartbeats may cause an arrhythmia to start. Please see the instructions below located under special instructions  Follow-Up: Your physician recommends that you schedule a follow-up appointment in: 4 weeks, after your procedure on 12/25/2019, with Roderic Palau NP in the AFib clinic.  Your physician recommends that you schedule a follow up appointment in: 3 months,  after your procedure on 12/25/2019, with Dr. Curt Bears.  Thank you for choosing [facility]!! Trinidad Curet, RN [phone]    Any Other Special Instructions Will Be Listed Below    CT INSTRUCTIONS Your cardiac CT will be scheduled at:   [facility] [address] [place], [place] [address] [phone]  Please arrive at the [facility] main entrance of [facility] at __________ on __________. Please arrive 30-45 minutes prior to test start time. Proceed to the [facility] Radiology Department (first floor) to check-in and test prep.  Please follow these instructions carefully (unless otherwise directed):  Hold all erectile dysfunction medications at least 48 hours prior to test.  On the Night Before the Test: . Be sure to Drink plenty of water. . Do not consume any caffeinated/decaffeinated beverages or chocolate 12 hours prior to your test. . Do not take any antihistamines 12 hours prior to your test. . If you take Metformin do not take 24 hours prior to test.  On the Day of the Test: . Drink plenty of water. Do not drink any water within one hour of the test. . Do not eat any food 4 hours prior to the test. . You may take your regular medications prior to the test.  . Take metoprolol (Lopressor) two hours prior to test. . HOLD Furosemide/Hydrochlorothiazide morning of the test  After the Test: . Drink plenty of water. . After receiving IV contrast, you may experience a mild flushed feeling. This is normal. . On occasion, you may experience a mild rash up to 24 hours after the test. This is  not dangerous. If this occurs, you can take Benadryl 25 mg and increase your fluid intake. . If you experience trouble breathing, this can be serious. If it is severe call 911 IMMEDIATELY. If it is mild, please call our office. . If you take any of these medications: Glipizide/Metformin, Avandament, Glucavance, please do not take 48 hours after completing  test.    Please contact the cardiac imaging nurse navigator should you have any questions/concerns Marchia Bond, RN Navigator Cardiac Imaging [facility] [phone] Office  [phone] Cell       Electrophysiology/Ablation Procedure Instructions   You are scheduled for a(n)  ablation on 12/25/2019 with Dr. Allegra Lai.   1.   Pre procedure testing-             A.  LAB WORK --- On 11/30/2019  for your pre procedure blood work.                 B. COVID TEST-- On 12/22/2019 @ 10:30 am    - You will go to [facility] hospital ([place]) for your Covid testing.   This is a drive thru test site.  There will be multiple testing areas.  Be sure to share with the first checkpoint that you are there for pre-procedure/surgery testing. This will put you into the right (yellow) lane that leads to the PAT testing team. Stay in your car and the nurse team will come to your car to test you.  After you are tested please go home and self quarantine until the day of your procedure.     2. On the day of your procedure 12/25/2019 you will go to [facility] [phone]) at 8:30 am.  Dennis Bast will go to the main entrance A [employer]) and enter where the [employer] are.  Your driver will drop you off and you will head down the hallway to ADMITTING.  You may have one support person come in to the hospital with you.  They will be asked to wait in the waiting room.   3.   Do not eat or drink after midnight prior to your procedure.   4.   Take 1/2 your usual bedtime insulin the night before your procedure. Do NOT take any medications the morning of your procedure.   5.  Plan for an overnight stay.  If you use your phone frequently bring your phone charger.   6. You will follow up with the AFIB clinic 4 weeks after your procedure.  You will follow up with Dr. Curt Bears  3 months after your procedure.  These appointments will be made for you.   *  If you have ANY questions please call the office [phone] and ask for Briar Sword RN or send me a MyChart message   * Occasionally, EP Studies and ablations can become lengthy.  Please make your family aware of this before your procedure starts.  Average time ranges from 2-8 hours for EP studies/ablations.  Your physician will call your family after the procedure with the results.                                     Cardiac Ablation Cardiac ablation is a procedure to disable (ablate) a small amount of heart tissue in very specific places. The heart has many electrical connections. Sometimes these connections are  abnormal and can cause the heart to beat very fast or irregularly. Ablating some of the problem areas can improve the heart rhythm or return it to normal. Ablation may be done for people who:  Have Wolff-Parkinson-White syndrome.  Have fast heart rhythms (tachycardia).  Have taken medicines for an abnormal heart rhythm (arrhythmia) that were not effective or caused side effects.  Have a high-risk heartbeat that may be life-threatening. During the procedure, a small incision is made in the neck or the groin, and a long, thin, flexible tube (catheter) is inserted into the incision and moved to the heart. Small devices (electrodes) on the tip of the catheter will send out electrical currents. A type of X-ray (fluoroscopy) will be used to help guide the catheter and to provide images of the heart. Tell a health care provider about:  Any allergies you have.  All medicines you are taking, including vitamins, herbs, eye drops, creams, and over-the-counter medicines.  Any problems you or family members have had with anesthetic medicines.  Any blood disorders you have.  Any surgeries you have had.  Any medical conditions you have, such as kidney failure.  Whether you are pregnant or may be pregnant. What are the risks? Generally, this is a safe procedure. However, problems may  occur, including:  Infection.  Bruising and bleeding at the catheter insertion site.  Bleeding into the chest, especially into the sac that surrounds the heart. This is a serious complication.  Stroke or blood clots.  Damage to other structures or organs.  Allergic reaction to medicines or dyes.  Need for a permanent pacemaker if the normal electrical system is damaged. A pacemaker is a small computer that sends electrical signals to the heart and helps your heart beat normally.  The procedure not being fully effective. This may not be recognized until months later. Repeat ablation procedures are sometimes required. What happens before the procedure?  Follow instructions from your health care provider about eating or drinking restrictions.  Ask your health care provider about: ? Changing or stopping your regular medicines. This is especially important if you are taking diabetes medicines or blood thinners. ? Taking medicines such as aspirin and ibuprofen. These medicines can thin your blood. Do not take these medicines before your procedure if your health care provider instructs you not to.  Plan to have someone take you home from the hospital or clinic.  If you will be going home right after the procedure, plan to have someone with you for 24 hours. What happens during the procedure?  To lower your risk of infection: ? Your health care team will wash or sanitize their hands. ? Your skin will be washed with soap. ? Hair may be removed from the incision area.  An IV tube will be inserted into one of your veins.  You will be given a medicine to help you relax (sedative).  The skin on your neck or groin will be numbed.  An incision will be made in your neck or your groin.  A needle will be inserted through the incision and into a large vein in your neck or groin.  A catheter will be inserted into the needle and moved to your heart.  Dye may be injected through the catheter  to help your surgeon see the area of the heart that needs treatment.  Electrical currents will be sent from the catheter to ablate heart tissue in desired areas. There are three types of energy that may be used  to ablate heart tissue: ? Heat (radiofrequency energy). ? Laser energy. ? Extreme cold (cryoablation).  When the necessary tissue has been ablated, the catheter will be removed.  Pressure will be held on the catheter insertion area to prevent excessive bleeding.  A bandage (dressing) will be placed over the catheter insertion area. The procedure may vary among health care providers and hospitals. What happens after the procedure?  Your blood pressure, heart rate, breathing rate, and blood oxygen level will be monitored until the medicines you were given have worn off.  Your catheter insertion area will be monitored for bleeding. You will need to lie still for a few hours to ensure that you do not bleed from the catheter insertion area.  Do not drive for 24 hours or as long as directed by your health care provider. Summary  Cardiac ablation is a procedure to disable (ablate) a small amount of heart tissue in very specific places. Ablating some of the problem areas can improve the heart rhythm or return it to normal.  During the procedure, electrical currents will be sent from the catheter to ablate heart tissue in desired areas. This information is not intended to replace advice given to you by your health care provider. Make sure you discuss any questions you have with your health care provider. Document Revised: 04/07/2018 Document Reviewed: 09/03/2016 Elsevier Patient Education  [place].

## 2019-11-30 NOTE — Progress Notes (Signed)
Electrophysiology Office Note   Date:  12/01/2019   ID:  Sean Gane., DOB October 09, 1951, MRN [id number]  PCP:  [name], MD  Cardiologist:  [name] Primary Electrophysiologist:  Zamier Eggebrecht Meredith Leeds, MD    Chief Complaint: AF   History of Present Illness: Sean Oh. is a 69 y.o. male who is being seen today for the evaluation of AF at the request of [name], Reita Cliche, MD. Presenting today for electrophysiology evaluation.  He has a history of hypertension, hyperlipidemia, CHF, and atrial fibrillation.  He had an attempted cardioversion 11/05/2019 but unfortunately did not return to sinus rhythm.  He has been having symptoms of weakness and fatigue for approximately last 2 months.  The symptoms also have been associated with some shortness of breath.  He does not have PND or orthopnea.  He had an echocardiogram that showed an ejection fraction of 35 to 40%.  Today, he denies symptoms of palpitations, chest pain, orthopnea, PND, lower extremity edema, claudication, dizziness, presyncope, syncope, bleeding, or neurologic sequela. The patient is tolerating medications without difficulties.    Past Medical History:  Diagnosis Date  . Allergic rhinitis   . Arrhythmia   . Atrial fibrillation ([place])   . CHF (congestive heart failure) ([place])   . Diabetes mellitus without complication ([place])   . Hyperlipidemia   . Hypertension    Past Surgical History:  Procedure Laterality Date  . CARDIAC CATHETERIZATION    . KNEE SURGERY    . PILONIDAL CYST / SINUS EXCISION       Current Outpatient Medications  Medication Sig Dispense Refill  . allopurinol (ZYLOPRIM) 100 MG tablet Take 100 mg by mouth daily.    Marland Kitchen amiodarone (PACERONE) 200 MG tablet Take 200 mg by mouth daily.     Marland Kitchen apixaban (ELIQUIS) 5 MG TABS tablet Take 1 tablet (5 mg total) by mouth 2 (two) times daily. 60 tablet 2  . Ascorbic Acid (VITAMIN C) 1000 MG tablet Take 1,000 mg by mouth 2 (two) times daily.    Marland Kitchen  atorvastatin (LIPITOR) 20 MG tablet Take 1 tablet (20 mg total) by mouth daily. 30 tablet 3  . B Complex-C (SUPER B COMPLEX PO) Take 400 mcg by mouth daily.    . Cholecalciferol (VITAMIN D3) 50 MCG (2000 UT) TABS Take 1 tablet by mouth 2 (two) times daily.    . empagliflozin (JARDIANCE) 25 MG TABS tablet Take 25 mg by mouth daily.    Marland Kitchen ENTRESTO 24-26 MG TAKE ONE TABLET BY MOUTH TWICE DAILY 60 tablet 5  . Ergocalciferol (VITAMIN D2 PO) Take by mouth.    . fluticasone (FLONASE) 50 MCG/ACT nasal spray Place 2 sprays into both nostrils daily as needed for allergies or rhinitis.    . Insulin Glargine (TOUJEO SOLOSTAR [place]) Inject 90 Units into the skin at bedtime.    . metoprolol tartrate (LOPRESSOR) 25 MG tablet Take 25 mg by mouth 2 (two) times daily.    . montelukast (SINGULAIR) 10 MG tablet Take 10 mg by mouth at bedtime.    . Omega-3 Fatty Acids (FISH OIL) 1200 MG CAPS Take 1 capsule by mouth 2 (two) times daily.    Marland Kitchen omeprazole (PRILOSEC) 40 MG capsule Take 40 mg by mouth 2 (two) times daily.     Marland Kitchen rOPINIRole (REQUIP) 2 MG tablet Take 2 mg by mouth at bedtime.    . tamsulosin (FLOMAX) 0.4 MG CAPS capsule Take 0.4 capsules by mouth 2 (two) times daily.    Marland Kitchen  venlafaxine XR (EFFEXOR-XR) 150 MG 24 hr capsule Take 1 capsule by mouth daily.    . Vitamin D, Ergocalciferol, (DRISDOL) 1.25 MG (50000 UT) CAPS capsule Take 50,000 Units by mouth every 7 (seven) days.    Marland Kitchen zolpidem (AMBIEN) 5 MG tablet Take 5 mg by mouth at bedtime as needed for sleep.     No current facility-administered medications for this visit.    Allergies:   Prednisone and Victoza [liraglutide]   Social History:  The patient  reports that he has never smoked. He has never used smokeless tobacco. He reports previous alcohol use. He reports that he does not use drugs.   Family History:  The patient's family history includes Hyperlipidemia in his father; Hypertension in his father.    ROS:  Please see the history of present  illness.   Otherwise, review of systems is positive for none.   All other systems are reviewed and negative.    PHYSICAL EXAM: VS:  BP 122/76   Pulse 83   Ht 6\' 2"  (1.88 m)   Wt (!) 313 lb (142 kg)   SpO2 96%   BMI 40.19 kg/m  , BMI Body mass index is 40.19 kg/m. GEN: Well nourished, well developed, in no acute distress  HEENT: normal  Neck: no JVD, carotid bruits, or masses Cardiac: Irregular, tachycardic; no murmurs, rubs, or gallops,no edema  Respiratory:  clear to auscultation bilaterally, normal work of breathing GI: soft, nontender, nondistended, + BS MS: no deformity or atrophy  Skin: warm and dry Neuro:  Strength and sensation are intact Psych: euthymic mood, full affect  EKG:  EKG is not ordered today. Personal review of the ekg ordered 11/24/19 shows AF, rate 86  Recent Labs: 09/29/2019: TSH 1.340 11/02/2019: ALT 33 11/30/2019: BUN 12; Creatinine, Ser 1.10; Hemoglobin 12.4; Platelets 238; Potassium 4.0; Sodium 145    Lipid Panel     Component Value Date/Time   CHOL 135 10/09/2019 0820   TRIG 159 (H) 10/09/2019 0820   HDL 47 10/09/2019 0820   CHOLHDL 2.9 10/09/2019 0820   LDLCALC 61 10/09/2019 0820     Wt Readings from Last 3 Encounters:  11/30/19 (!) 313 lb (142 kg)  11/24/19 (!) 315 lb (142.9 kg)  11/02/19 (!) 311 lb (141.1 kg)      Other studies Reviewed: Additional studies/ records that were reviewed today include: TTE 09/14/2019 Review of the above records today demonstrates:  Ejection fraction 35 to 40% Moderately dilated left ventricle Mildly enlarged right ventricle Moderately dilated left atrium   ASSESSMENT AND PLAN:  1.  Persistent atrial fibrillation: Currently on amiodarone and Eliquis.  He is status post unsuccessful cardioversion.  His ejection fraction has now gone down to 35 to 40%.  He would likely benefit from ablation.  Risks and benefits were discussed which include bleeding, tamponade, heart block, stroke, damage surrounding  organs.  He understands these risks and is agreed to the procedure.  For improved rate control, Sean [name] increase metoprolol to 50 mg twice daily.  2.  Likely nonischemic cardiomyopathy: Ejection fraction 35 to 40%.  Currently on Entresto and metoprolol.  3.  Hypertension: Currently well controlled  Case discussed with referring cardiologist  Current medicines are reviewed at length with the patient today.   The patient does not have concerns regarding his medicines.  The following changes were made today: Increase metoprolol  Labs/ tests ordered today include:  Orders Placed This Encounter  Procedures  . CT CARDIAC MORPH/PULM VEIN W/CM&W/O CA SCORE  .  CT CORONARY FRACTIONAL FLOW RESERVE DATA PREP  . CT CORONARY FRACTIONAL FLOW RESERVE FLUID ANALYSIS  . Basic metabolic panel  . CBC     Disposition:   FU with Sean [name] 3 months  Signed, Ashia Dehner Meredith Leeds, MD  12/01/2019 7:54 AM     [facility] [address] [place] [address] [phone] (office) [phone] (fax)

## 2019-12-01 ENCOUNTER — Telehealth: Payer: Self-pay | Admitting: Cardiology

## 2019-12-01 ENCOUNTER — Encounter: Payer: Self-pay | Admitting: Cardiology

## 2019-12-01 LAB — BASIC METABOLIC PANEL
BUN/Creatinine Ratio: 11 (ref 10–24)
BUN: 12 mg/dL (ref 8–27)
CO2: 20 mmol/L (ref 20–29)
Calcium: 9.1 mg/dL (ref 8.6–10.2)
Chloride: 109 mmol/L — ABNORMAL HIGH (ref 96–106)
Creatinine, Ser: 1.1 mg/dL (ref 0.76–1.27)
GFR calc Af Amer: 78 mL/min/{1.73_m2} (ref >59)
GFR calc non Af Amer: 68 mL/min/{1.73_m2} (ref >59)
Glucose: 130 mg/dL — ABNORMAL HIGH (ref 65–99)
Potassium: 4 mmol/L (ref 3.5–5.2)
Sodium: 145 mmol/L — ABNORMAL HIGH (ref 134–144)

## 2019-12-01 LAB — CBC
Hematocrit: 37.2 % — ABNORMAL LOW (ref 37.5–51.0)
Hemoglobin: 12.4 g/dL — ABNORMAL LOW (ref 13.0–17.7)
MCH: 30 pg (ref 26.6–33.0)
MCHC: 33.3 g/dL (ref 31.5–35.7)
MCV: 90 fL (ref 79–97)
Platelets: 238 10*3/uL (ref 150–450)
RBC: 4.14 x10E6/uL (ref 4.14–5.80)
RDW: 16.3 % — ABNORMAL HIGH (ref 11.6–15.4)
WBC: 6.7 10*3/uL (ref 3.4–10.8)

## 2019-12-01 NOTE — Telephone Encounter (Signed)
Patient has some questions for Dr. Curt Bears nurse in regards to a conversation him and Dr. Curt Bears had yesterday at his OV about an ablation.

## 2019-12-03 DIAGNOSIS — H524 Presbyopia: Secondary | ICD-10-CM | POA: Diagnosis not present

## 2019-12-03 DIAGNOSIS — Z961 Presence of intraocular lens: Secondary | ICD-10-CM | POA: Diagnosis not present

## 2019-12-03 DIAGNOSIS — H5203 Hypermetropia, bilateral: Secondary | ICD-10-CM | POA: Diagnosis not present

## 2019-12-03 DIAGNOSIS — H26493 Other secondary cataract, bilateral: Secondary | ICD-10-CM | POA: Diagnosis not present

## 2019-12-03 DIAGNOSIS — E119 Type 2 diabetes mellitus without complications: Secondary | ICD-10-CM | POA: Diagnosis not present

## 2019-12-03 DIAGNOSIS — H52223 Regular astigmatism, bilateral: Secondary | ICD-10-CM | POA: Diagnosis not present

## 2019-12-03 NOTE — Telephone Encounter (Signed)
See 2/2 Mychart message for documentation. Pt aware we are awaiting [place] response.

## 2019-12-07 DIAGNOSIS — I10 Essential (primary) hypertension: Secondary | ICD-10-CM | POA: Diagnosis not present

## 2019-12-07 DIAGNOSIS — G4700 Insomnia, unspecified: Secondary | ICD-10-CM | POA: Diagnosis not present

## 2019-12-07 DIAGNOSIS — Z6841 Body Mass Index (BMI) 40.0 and over, adult: Secondary | ICD-10-CM | POA: Diagnosis not present

## 2019-12-15 ENCOUNTER — Other Ambulatory Visit: Payer: Self-pay

## 2019-12-15 ENCOUNTER — Encounter: Payer: Self-pay | Admitting: Internal Medicine

## 2019-12-15 ENCOUNTER — Ambulatory Visit (INDEPENDENT_AMBULATORY_CARE_PROVIDER_SITE_OTHER): Payer: Medicare Other | Admitting: Internal Medicine

## 2019-12-15 DIAGNOSIS — R05 Cough: Secondary | ICD-10-CM | POA: Diagnosis not present

## 2019-12-15 DIAGNOSIS — R058 Other specified cough: Secondary | ICD-10-CM | POA: Insufficient documentation

## 2019-12-15 DIAGNOSIS — R0600 Dyspnea, unspecified: Secondary | ICD-10-CM | POA: Diagnosis not present

## 2019-12-15 DIAGNOSIS — R0609 Other forms of dyspnea: Secondary | ICD-10-CM

## 2019-12-15 HISTORY — DX: Cough: R05

## 2019-12-15 HISTORY — DX: Other specified cough: R05.8

## 2019-12-15 NOTE — Assessment & Plan Note (Signed)
ERV only 14% on PFTs 10/08/2019 c/w effects of obesity on lung volumes  Body mass index is 39.75 kg/m.  -  trending down/ encourage Lab Results  Component Value Date   TSH 1.340 09/29/2019     Contributing to gerd risk/ doe/reviewed the need and the process to achieve and maintain neg calorie balance > defer f/u primary care including intermittently monitoring thyroid status            Each maintenance medication was reviewed in detail including emphasizing most importantly the difference between maintenance and prns and under what circumstances the prns are to be triggered using an action plan format where appropriate.  Total time for H and P, chart review, counseling,  and generating customized AVS unique to this office visit / charting = 30 min

## 2019-12-15 NOTE — Progress Notes (Signed)
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 Pulmonary Consult    Referred by Dr Lennox Pippins for eval of Dyspnea.   Dyspnea:  Fatigue > sob and light headed if stands up  quickly  Cough: none  Sleep: on back cpap 8 y prior to OV   SABA use: has not used yet rec We will request the pfts from [facility] lasix when light headed standing Make sure you check your oxygen saturations at highest level of activity to be sure it stays over 90% and  Record it at least once a week and let me know if trending down   12/15/2019  f/u ov/Akiva Brassfield re: doe better,  s/p covid shot Jan 29 21  Chief Complaint  Patient presents with  . Follow-up    Overall breathing has improved since the last visit. He has good days and bad days with breathing. He has noticed he wheezes some when he wakes in the am.   Dyspnea:  Bending over makes him more sob and wheezy but not using saba at all and no sleep disruptions on cpap  Cough: none  Sleeping: has cpap x 8 y per Dr Delton Coombes  SABA use:  Never uses 02: none    No obvious day to day or daytime variability or assoc excess/ purulent sputum or mucus plugs or hemoptysis or cp or chest tightness, subjective wheeze or overt sinus or hb symptoms.   Sleeping on cpap without nocturnal  or  early am exacerbation  of respiratory  c/o's or need for noct saba. Also denies any obvious fluctuation of symptoms with weather or environmental changes or other aggravating or alleviating factors except as outlined above   No unusual exposure hx or h/o childhood pna  or knowledge of premature birth.  Current Allergies, Complete Past Medical History, Past Surgical History, Family History, and Social History were reviewed in [employer] record.  ROS  The following are not active complaints unless bolded Hoarseness, sore throat, dysphagia, dental problems, itching, sneezing,  nasal congestion or discharge of excess mucus or purulent secretions, ear ache,   fever, chills, sweats, unintended wt loss or wt gain, classically pleuritic or exertional cp,  orthopnea pnd or arm/hand swelling  or leg swelling improving esp on L/not using elastic hose consistently any more , presyncope, palpitations, abdominal pain, anorexia, nausea, vomiting, diarrhea  or change in bowel habits or change in bladder habits, change in stools or change in urine, dysuria, hematuria,  rash, arthralgias, visual complaints, headache, numbness, weakness or ataxia or problems with walking or coordination,  change in mood or  memory.        Current  Meds  Medication Sig  . allopurinol (ZYLOPRIM) 100 MG tablet Take 100 mg by mouth daily.  Marland Kitchen amiodarone (PACERONE) 200 MG tablet Take 200 mg by mouth daily.   Marland Kitchen apixaban (ELIQUIS) 5 MG TABS tablet Take 1 tablet (5 mg total) by mouth 2 (two) times daily.  . Ascorbic Acid (VITAMIN C) 1000 MG tablet Take 1,000 mg by mouth 2 (two) times daily.  Marland Kitchen atorvastatin (LIPITOR) 20 MG tablet Take 1 tablet (20 mg total) by mouth daily.  . B Complex-C (SUPER B COMPLEX PO) Take 400 mcg by mouth daily.  . Cholecalciferol (VITAMIN D3) 50 MCG (2000 UT) TABS Take 1 tablet by mouth 2 (two) times daily.  . empagliflozin (JARDIANCE) 25 MG TABS tablet Take 25 mg by mouth daily.  Marland Kitchen ENTRESTO  24-26 MG TAKE ONE TABLET BY MOUTH TWICE DAILY  . Ergocalciferol (VITAMIN D2 PO) Take by mouth.  . eszopiclone (LUNESTA) 2 MG TABS tablet Take 0.5 mg by mouth at bedtime.  . fluticasone (FLONASE) 50 MCG/ACT nasal spray Place 2 sprays into both nostrils daily as needed for allergies or rhinitis.  . Insulin Glargine (TOUJEO SOLOSTAR [place]) Inject 90 Units into the skin at bedtime.  . metoprolol tartrate (LOPRESSOR) 25 MG tablet Take 25 mg by mouth 2 (two) times daily.  . montelukast (SINGULAIR) 10 MG tablet Take 10 mg by mouth at bedtime.  . Omega-3 Fatty Acids (FISH OIL) 1200 MG CAPS Take 1 capsule by mouth 2 (two) times daily.  Marland Kitchen omeprazole (PRILOSEC) 40 MG capsule Take 40 mg by mouth 2 (two) times daily.   Marland Kitchen rOPINIRole (REQUIP) 2 MG tablet Take 2 mg by mouth at bedtime.  . tamsulosin (FLOMAX) 0.4 MG CAPS capsule Take 0.4 capsules by mouth 2 (two) times daily.  Marland Kitchen venlafaxine XR (EFFEXOR-XR) 150 MG 24 hr capsule Take 1 capsule by mouth daily.  . Vitamin D, Ergocalciferol, (DRISDOL) 1.25 MG (50000 UT) CAPS capsule Take 50,000 Units by mouth every 7 (seven) days.                     Past Medical History:  Diagnosis Date  . Allergic rhinitis   . Arrhythmia   . Atrial fibrillation ([place])   . CHF (congestive heart failure) ([place])   . Diabetes mellitus without complication ([place])   . Hyperlipidemia   . Hypertension       Objective:     amb obese wm nad/ occ throat clearing, some pseudowheeze resolves with plm   Wt Readings from Last 3 Encounters:  12/15/19 (!) 309 lb 9.6 oz (140.4 kg)  11/30/19 (!) 313 lb (142 kg)  11/24/19 (!) 315 lb (142.9 kg)     Vital signs reviewed - Note on arrival 02 sats  95% on RA     HEENT : pt wearing mask not removed for exam due to covid -19 concerns.    NECK :  without JVD/Nodes/TM/ nl carotid upstrokes bilaterally   LUNGS: no acc muscle use,  Nl contour chest which is clear to A and P bilaterally without cough on insp or exp maneuvers   CV:   RRR  no s3 or murmur or increase in P2, and trace pitting on L, none on R LE  ABD:  Obese  and nontender with nl inspiratory excursion in the supine position. No bruits or organomegaly appreciated, bowel sounds nl  MS:  Nl gait/ ext warm without deformities, calf tenderness, cyanosis or clubbing No obvious joint restrictions   SKIN: warm  and dry without lesions    NEURO:  alert, approp, nl sensorium with  no motor or cerebellar deficits apparent.            Assessment

## 2019-12-15 NOTE — Assessment & Plan Note (Signed)
S/p PE  11/2018 > DOAC recurrent 09/14/2019 off DOAC so resumed  - onset variable doe and orthostatic lightheadedness 06/2019 with afib - PFT's Oval Linsey  10/08/2019 :  FEV1 2.79 (73%) with ratio 75 and 6 % better p saba,  Air trapping on lung vol,  ERV 14% and dlco 22.78 (60%) corrects to 3.68 (76%) for vol with minimal concavity to f/v loop  - 10/29/2019   Walked RA x two laps =  approx 587ft @ avg pace - stopped due to end of study, no sob with sats of 96% at the end of the study.  Upper airway cough syndrome (previously labeled PNDS),  is so named because it's frequently impossible to sort out how much is  CR/sinusitis with freq throat clearing (which can be related to primary GERD)   vs  causing  secondary (" extra esophageal")  GERD from wide swings in gastric pressure that occur with throat clearing, often  promoting self use of mint and menthol lozenges that reduce the lower esophageal sphincter tone and exacerbate the problem further in a cyclical fashion.   These are the same pts (now being labeled as having "irritable larynx syndrome" by some cough centers) who not infrequently have a history of having failed to tolerate ace inhibitors (and about 10% of entresto pts per PI, though not as troublesome as ACEi in this setting) ,  dry powder inhalers or biphosphonates or report having atypical/extraesophageal reflux symptoms that don't respond to standard doses of PPI  and are easily confused as having aecopd or asthma flares by even experienced allergists/ pulmonologists (myself included).   Prior to adding additional meds for cough or asthma would strongly consider a 6 week trial off entresto first just to identify the problem then make a clear choice in terms of options for longterm rx.

## 2019-12-15 NOTE — Assessment & Plan Note (Signed)
S/p PE  11/2018 > DOAC recurrent 09/14/2019 off DOAC so resumed  - onset variable doe and orthostatic lightheadedness 06/2019 with afib - PFT's Sean [name]  10/08/2019 :  FEV1 2.79 (73%) with ratio 75 and 6 % better p saba,  Air trapping on lung vol,  ERV 14% and dlco 22.78 (60%) corrects to 3.68 (76%) for vol with minimal concavity to f/v loop  - 10/29/2019   Walked RA x two laps =  approx 527ft @ avg pace - stopped due to end of study, no sob with sats of 96% at the end of the study.   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   3) pulmonary f/u is prn

## 2019-12-15 NOTE — Patient Instructions (Addendum)
Weight control is simply a matter of calorie balance which needs to be tilted in your favor by eating less and exercising more.  To get the most out of exercise, you need to be continuously aware that you are short of breath, but never out of breath, for 30 minutes daily. As you improve, it will actually be easier for you to do the same amount of exercise  in  30 minutes so always push to the level where you are short of breath.  If this does not result in gradual weight reduction then I strongly recommend you see a nutritionist with a food diary x 2 weeks to  work out a negative calorie balance which is universally effective in steady weight loss programs.  Think of your calorie balance like you do your bank account where in this case you want the balance to go down so you must take in less calories than you burn up.  It's just that simple:  Hard to do, but easy to understand.  Good luck!   Pulmonary follow up is as needed

## 2019-12-18 ENCOUNTER — Encounter (HOSPITAL_COMMUNITY): Payer: Self-pay

## 2019-12-18 ENCOUNTER — Telehealth (HOSPITAL_COMMUNITY): Payer: Self-pay | Admitting: Emergency Medicine

## 2019-12-18 DIAGNOSIS — D649 Anemia, unspecified: Secondary | ICD-10-CM | POA: Diagnosis not present

## 2019-12-18 NOTE — Telephone Encounter (Signed)
Reaching out to patient to offer assistance regarding upcoming cardiac imaging study; pt verbalizes understanding of appt date/time, parking situation and where to check in, pre-test NPO status and medications ordered, and verified current allergies; name and call back number provided for further questions should they arise Sean Bond RN [facility] [phone] office [phone] cell  Verified medications to take prior to test- pt verbalized understanding

## 2019-12-21 ENCOUNTER — Ambulatory Visit (HOSPITAL_COMMUNITY)
Admission: RE | Admit: 2019-12-21 | Discharge: 2019-12-21 | Disposition: A | Discharge status: Home / Self Care | Payer: Medicare Other | Source: Ambulatory Visit | Attending: Cardiology | Admitting: Cardiology

## 2019-12-21 ENCOUNTER — Other Ambulatory Visit: Payer: Self-pay

## 2019-12-21 DIAGNOSIS — I4819 Other persistent atrial fibrillation: Secondary | ICD-10-CM

## 2019-12-21 MED ORDER — IOHEXOL 350 MG/ML SOLN
100.0000 mL | Freq: Once | INTRAVENOUS | Status: AC | PRN
  Administered 2019-12-21: 100 mL via INTRAVENOUS

## 2019-12-22 ENCOUNTER — Other Ambulatory Visit (HOSPITAL_COMMUNITY)
Admission: RE | Admit: 2019-12-22 | Discharge: 2019-12-22 | Disposition: A | Discharge status: Home / Self Care | Payer: Medicare Other | Source: Ambulatory Visit | Attending: Cardiology | Admitting: Cardiology

## 2019-12-22 DIAGNOSIS — Z20822 Contact with and (suspected) exposure to covid-19: Secondary | ICD-10-CM | POA: Diagnosis not present

## 2019-12-22 DIAGNOSIS — Z01812 Encounter for preprocedural laboratory examination: Secondary | ICD-10-CM | POA: Diagnosis not present

## 2019-12-22 LAB — SARS CORONAVIRUS 2 (TAT 6-24 HRS): SARS Coronavirus 2: NEGATIVE

## 2019-12-24 NOTE — Progress Notes (Signed)
Instructed patient on the following items: Arrival time 0830  Nothing to eat or drink after midnight No meds AM of procedure Responsible person to drive you home and stay with you for 24 hrs  Have you missed any doses of anti-coagulant Eliquis- no missed doses

## 2019-12-24 NOTE — Anesthesia Preprocedure Evaluation (Addendum)
Anesthesia Evaluation  Patient identified by MRN, date of birth, ID band Patient awake    Reviewed: Allergy & Precautions, NPO status , Patient's Chart, lab work & pertinent test results  Airway Mallampati: II  TM Distance: >3 FB Neck ROM: Full    Dental no notable dental hx. (+) Loose, Dental Advisory Given,    Pulmonary neg pulmonary ROS,    Pulmonary exam normal breath sounds clear to auscultation       Cardiovascular hypertension, Pt. on medications and Pt. on home beta blockers +CHF and + DOE  Normal cardiovascular exam+ dysrhythmias Atrial Fibrillation  Rhythm:Irregular Rate:Abnormal  11/20 Echo  EF 35-40% severe Hypokinesis   Neuro/Psych negative neurological ROS  negative psych ROS   GI/Hepatic GERD  Medicated,  Endo/Other  diabetes, Type 1, Insulin Dependent  Renal/GU      Musculoskeletal negative musculoskeletal ROS (+)   Abdominal (+) + obese,   Peds  Hematology negative hematology ROS (+)   Anesthesia Other Findings   Reproductive/Obstetrics                            Anesthesia Physical Anesthesia Plan  ASA: III  Anesthesia Plan: General   Post-op Pain Management:    Induction: Intravenous  PONV Risk Score and Plan: 3 and Treatment may vary due to age or medical condition, Ondansetron and Dexamethasone  Airway Management Planned: Oral ETT  Additional Equipment: None  Intra-op Plan:   Post-operative Plan: Extubation in OR  Informed Consent:     Dental advisory given  Plan Discussed with:   Anesthesia Plan Comments:         Anesthesia Quick Evaluation

## 2019-12-25 ENCOUNTER — Ambulatory Visit (HOSPITAL_COMMUNITY)
Admission: RE | Admit: 2019-12-25 | Discharge: 2019-12-25 | Disposition: A | Discharge status: Home / Self Care | Payer: Medicare Other | Attending: Cardiology | Admitting: Cardiology

## 2019-12-25 ENCOUNTER — Encounter (HOSPITAL_COMMUNITY)
Admission: RE | Disposition: A | Discharge status: Home / Self Care | Payer: Medicare Other | Source: Home / Self Care | Attending: Cardiology

## 2019-12-25 ENCOUNTER — Encounter (HOSPITAL_COMMUNITY): Payer: Self-pay | Admitting: Cardiology

## 2019-12-25 ENCOUNTER — Ambulatory Visit (HOSPITAL_COMMUNITY): Payer: Medicare Other | Admitting: Anesthesiology

## 2019-12-25 ENCOUNTER — Other Ambulatory Visit: Payer: Self-pay

## 2019-12-25 DIAGNOSIS — I4819 Other persistent atrial fibrillation: Secondary | ICD-10-CM | POA: Diagnosis not present

## 2019-12-25 DIAGNOSIS — I509 Heart failure, unspecified: Secondary | ICD-10-CM | POA: Diagnosis not present

## 2019-12-25 DIAGNOSIS — E785 Hyperlipidemia, unspecified: Secondary | ICD-10-CM | POA: Insufficient documentation

## 2019-12-25 DIAGNOSIS — I110 Hypertensive heart disease with heart failure: Secondary | ICD-10-CM | POA: Diagnosis not present

## 2019-12-25 DIAGNOSIS — J309 Allergic rhinitis, unspecified: Secondary | ICD-10-CM | POA: Diagnosis not present

## 2019-12-25 DIAGNOSIS — Z7901 Long term (current) use of anticoagulants: Secondary | ICD-10-CM | POA: Insufficient documentation

## 2019-12-25 DIAGNOSIS — E119 Type 2 diabetes mellitus without complications: Secondary | ICD-10-CM | POA: Diagnosis not present

## 2019-12-25 DIAGNOSIS — E782 Mixed hyperlipidemia: Secondary | ICD-10-CM | POA: Diagnosis not present

## 2019-12-25 DIAGNOSIS — Z794 Long term (current) use of insulin: Secondary | ICD-10-CM | POA: Insufficient documentation

## 2019-12-25 DIAGNOSIS — K219 Gastro-esophageal reflux disease without esophagitis: Secondary | ICD-10-CM | POA: Diagnosis not present

## 2019-12-25 DIAGNOSIS — Z79899 Other long term (current) drug therapy: Secondary | ICD-10-CM | POA: Diagnosis not present

## 2019-12-25 HISTORY — PX: ATRIAL FIBRILLATION ABLATION: EP1191

## 2019-12-25 LAB — GLUCOSE, CAPILLARY
Glucose-Capillary: 128 mg/dL — ABNORMAL HIGH (ref 70–99)
Glucose-Capillary: 60 mg/dL — ABNORMAL LOW (ref 70–99)
Glucose-Capillary: 61 mg/dL — ABNORMAL LOW (ref 70–99)
Glucose-Capillary: 106 mg/dL — ABNORMAL HIGH (ref 70–99)

## 2019-12-25 LAB — POCT ACTIVATED CLOTTING TIME
Activated Clotting Time: 235 seconds
Activated Clotting Time: 285 seconds
Activated Clotting Time: 312 seconds

## 2019-12-25 SURGERY — ATRIAL FIBRILLATION ABLATION
Anesthesia: General

## 2019-12-25 MED ORDER — SODIUM CHLORIDE 0.9 % IV SOLN: INTRAVENOUS | Status: DC

## 2019-12-25 MED ORDER — DOBUTAMINE IN D5W 4-5 MG/ML-% IV SOLN
INTRAVENOUS | Status: DC | PRN
  Administered 2019-12-25: 20 ug/kg/min via INTRAVENOUS

## 2019-12-25 MED ORDER — HEPARIN (PORCINE) IN NACL 1000-0.9 UT/500ML-% IV SOLN
INTRAVENOUS | Status: AC
  Filled 2019-12-25: qty 500

## 2019-12-25 MED ORDER — FENTANYL CITRATE (PF) 100 MCG/2ML IJ SOLN
INTRAMUSCULAR | Status: DC | PRN
  Administered 2019-12-25: 100 ug via INTRAVENOUS

## 2019-12-25 MED ORDER — LIDOCAINE 2% (20 MG/ML) 5 ML SYRINGE
INTRAMUSCULAR | Status: DC | PRN
  Administered 2019-12-25: 100 mg via INTRAVENOUS

## 2019-12-25 MED ORDER — PROPOFOL 10 MG/ML IV BOLUS
INTRAVENOUS | Status: DC | PRN
  Administered 2019-12-25: 180 mg via INTRAVENOUS

## 2019-12-25 MED ORDER — PROTAMINE SULFATE 10 MG/ML IV SOLN
INTRAVENOUS | Status: DC | PRN
  Administered 2019-12-25: 50 mg via INTRAVENOUS

## 2019-12-25 MED ORDER — HEPARIN SODIUM (PORCINE) 1000 UNIT/ML IJ SOLN
INTRAMUSCULAR | Status: DC | PRN
  Administered 2019-12-25: 6000 [IU] via INTRAVENOUS
  Administered 2019-12-25: 8000 [IU] via INTRAVENOUS
  Administered 2019-12-25: 15000 [IU] via INTRAVENOUS

## 2019-12-25 MED ORDER — DOBUTAMINE IN D5W 4-5 MG/ML-% IV SOLN
INTRAVENOUS | Status: AC
  Filled 2019-12-25: qty 250

## 2019-12-25 MED ORDER — ONDANSETRON HCL 4 MG/2ML IJ SOLN: 4.0000 mg | Freq: Four times a day (QID) | INTRAMUSCULAR | Status: DC | PRN

## 2019-12-25 MED ORDER — HEPARIN (PORCINE) IN NACL 1000-0.9 UT/500ML-% IV SOLN
INTRAVENOUS | Status: AC
  Filled 2019-12-25: qty 2000

## 2019-12-25 MED ORDER — HEPARIN SODIUM (PORCINE) 1000 UNIT/ML IJ SOLN
INTRAMUSCULAR | Status: AC
  Filled 2019-12-25: qty 1

## 2019-12-25 MED ORDER — ACETAMINOPHEN 325 MG PO TABS
650.0000 mg | ORAL_TABLET | ORAL | Status: DC | PRN
  Filled 2019-12-25: qty 2

## 2019-12-25 MED ORDER — MIDAZOLAM HCL 5 MG/5ML IJ SOLN
INTRAMUSCULAR | Status: DC | PRN
  Administered 2019-12-25: 2 mg via INTRAVENOUS

## 2019-12-25 MED ORDER — HEPARIN (PORCINE) IN NACL 1000-0.9 UT/500ML-% IV SOLN
INTRAVENOUS | Status: DC | PRN
  Administered 2019-12-25 (×5): 500 mL

## 2019-12-25 MED ORDER — ROCURONIUM BROMIDE 10 MG/ML (PF) SYRINGE
PREFILLED_SYRINGE | INTRAVENOUS | Status: DC | PRN
  Administered 2019-12-25: 80 mg via INTRAVENOUS

## 2019-12-25 MED ORDER — HEPARIN SODIUM (PORCINE) 1000 UNIT/ML IJ SOLN
INTRAMUSCULAR | Status: DC | PRN
  Administered 2019-12-25: 1000 [IU] via INTRAVENOUS

## 2019-12-25 MED ORDER — SUGAMMADEX SODIUM 200 MG/2ML IV SOLN
INTRAVENOUS | Status: DC | PRN
  Administered 2019-12-25: 280 mg via INTRAVENOUS
  Administered 2019-12-25: 140 mg via INTRAVENOUS

## 2019-12-25 MED ORDER — SODIUM CHLORIDE 0.9 % IV SOLN: 250.0000 mL | INTRAVENOUS | Status: DC | PRN

## 2019-12-25 MED ORDER — ONDANSETRON HCL 4 MG/2ML IJ SOLN
INTRAMUSCULAR | Status: DC | PRN
  Administered 2019-12-25: 4 mg via INTRAVENOUS

## 2019-12-25 MED ORDER — EPHEDRINE SULFATE-NACL 50-0.9 MG/10ML-% IV SOSY
PREFILLED_SYRINGE | INTRAVENOUS | Status: DC | PRN
  Administered 2019-12-25 (×3): 10 mg via INTRAVENOUS

## 2019-12-25 MED ORDER — PHENYLEPHRINE HCL-NACL 10-0.9 MG/250ML-% IV SOLN
INTRAVENOUS | Status: DC | PRN
  Administered 2019-12-25: 40 ug/min via INTRAVENOUS

## 2019-12-25 MED ORDER — SODIUM CHLORIDE 0.9% FLUSH: 3.0000 mL | INTRAVENOUS | Status: DC | PRN

## 2019-12-25 SURGICAL SUPPLY — 23 items
BLANKET WARM UNDERBOD FULL ACC (MISCELLANEOUS) ×3 IMPLANT
CATH MAPPNG PENTARAY F 2-6-2MM (CATHETERS) ×1 IMPLANT
CATH SMTCH THERMOCOOL SF DF (CATHETERS) ×3 IMPLANT
CATH SOUNDSTAR ECO 8FR (CATHETERS) ×3 IMPLANT
CATH WEBSTER BI DIR CS D-F CRV (CATHETERS) ×3 IMPLANT
COVER SWIFTLINK CONNECTOR (BAG) ×3 IMPLANT
DEVICE CLOSURE PERCLS PRGLD 6F (VASCULAR PRODUCTS) ×4 IMPLANT
HOVERMATT SINGLE USE (MISCELLANEOUS) ×3 IMPLANT
PACK EP LATEX FREE (CUSTOM PROCEDURE TRAY) ×2
PACK EP LF (CUSTOM PROCEDURE TRAY) ×1 IMPLANT
PAD PRO RADIOLUCENT 2001M-C (PAD) ×3 IMPLANT
PATCH CARTO3 (PAD) ×3 IMPLANT
PENTARAY F 2-6-2MM (CATHETERS) ×3
PERCLOSE PROGLIDE 6F (VASCULAR PRODUCTS) ×12
SHEATH AGILIS NXT 8.5F 71CM (SHEATH) ×3 IMPLANT
SHEATH BAYLIS SUREFLEX  M 8.5 (SHEATH) ×2
SHEATH BAYLIS SUREFLEX M 8.5 (SHEATH) ×1 IMPLANT
SHEATH BAYLIS TRANSSEPTAL 98CM (NEEDLE) ×3 IMPLANT
SHEATH PINNACLE 7F 10CM (SHEATH) ×3 IMPLANT
SHEATH PINNACLE 8F 10CM (SHEATH) ×6 IMPLANT
SHEATH PINNACLE 9F 10CM (SHEATH) ×3 IMPLANT
SHEATH PROBE COVER 6X72 (BAG) ×3 IMPLANT
TUBING SMART ABLATE COOLFLOW (TUBING) ×3 IMPLANT

## 2019-12-25 NOTE — Anesthesia Postprocedure Evaluation (Signed)
Anesthesia Post Note  Patient: Sean [name].  Procedure(s) Performed: ATRIAL FIBRILLATION ABLATION (N/A )     Patient location during evaluation: PACU Anesthesia Type: General Level of consciousness: awake and alert Pain management: pain level controlled Vital Signs Assessment: post-procedure vital signs reviewed and stable Respiratory status: spontaneous breathing, nonlabored ventilation and respiratory function stable Cardiovascular status: blood pressure returned to baseline and stable Postop Assessment: no apparent nausea or vomiting Anesthetic complications: no    Last Vitals:  Vitals:   12/25/19 1504 12/25/19 1548  BP: 134/77 (!) 154/94  Pulse: 74 72  Resp: 12 13  Temp: (!) 36.2 C (!) 36.1 C  SpO2:      Last Pain:  Vitals:   12/25/19 1548  TempSrc: Temporal  PainSc: 0-No pain                 Catalina Gravel

## 2019-12-25 NOTE — Progress Notes (Signed)
CBG at 1530 was 60, and the pt was given 4 oz oj Orange Juice with several packets of sugar. Repeat CBG at 1612 was 61. Pt refuse to drink OJ, but was given graham crackers with peunut butter.

## 2019-12-25 NOTE — Discharge Instructions (Signed)

## 2019-12-25 NOTE — Transfer of Care (Signed)
Immediate Anesthesia Transfer of Care Note  Patient: Sean [name].  Procedure(s) Performed: ATRIAL FIBRILLATION ABLATION (N/A )  Patient Location: Cath Lab  Anesthesia Type:General  Level of Consciousness: awake, alert  and oriented  Airway & Oxygen Therapy: Patient Spontanous Breathing and Patient connected to face mask oxygen  Post-op Assessment: Report given to RN and Patient moving all extremities X 4  Post vital signs: Reviewed and stable  Last Vitals:  Vitals Value Taken Time  BP    Temp    Pulse 72 12/25/19 1502  Resp 12 12/25/19 1502  SpO2 97 % 12/25/19 1502  Vitals shown include unvalidated device data.  Last Pain:  Vitals:   12/25/19 0857  TempSrc:   PainSc: 0-No pain         Complications: No apparent anesthesia complications

## 2019-12-25 NOTE — H&P (Signed)
Sean [name]. has presented today for surgery, with the diagnosis of atrial fibrillation.  The various methods of treatment have been discussed with the patient and family. After consideration of risks, benefits and other options for treatment, the patient has consented to  Procedure(s): Catheter ablation as a surgical intervention .  Risks include but not limited to bleeding, tamponade, heart block, stroke, damage to surrounding organs, among others. The patient's history has been reviewed, patient examined, no change in status, stable for surgery.  I have reviewed the patient's chart and labs.  Questions were answered to the patient's satisfaction.    Lennell Shanks Curt Bears, MD 12/25/2019 11:17 AM

## 2019-12-25 NOTE — Progress Notes (Signed)
Pt ambulated without difficulty or bleeding,  Discharged to home with wife

## 2019-12-25 NOTE — Anesthesia Procedure Notes (Signed)
Procedure Name: Intubation Date/Time: 12/25/2019 12:02 PM Performed by: Kyung Rudd, CRNA Pre-anesthesia Checklist: Patient identified, Emergency Drugs available, Suction available and Patient being monitored Patient Re-evaluated:Patient Re-evaluated prior to induction Oxygen Delivery Method: Circle system utilized Preoxygenation: Pre-oxygenation with 100% oxygen Induction Type: IV induction Ventilation: Mask ventilation without difficulty and Oral airway inserted - appropriate to patient size Laryngoscope Size: Mac and 4 Grade View: Grade I Tube type: Oral Tube size: 7.0 mm Number of attempts: 1 Airway Equipment and Method: Stylet Placement Confirmation: ETT inserted through vocal cords under direct vision,  positive ETCO2 and breath sounds checked- equal and bilateral Secured at: 22 cm Tube secured with: Tape Dental Injury: Teeth and Oropharynx as per pre-operative assessment

## 2019-12-26 ENCOUNTER — Other Ambulatory Visit: Payer: Self-pay

## 2019-12-26 ENCOUNTER — Emergency Department (HOSPITAL_COMMUNITY): Payer: Medicare Other

## 2019-12-26 ENCOUNTER — Encounter (HOSPITAL_COMMUNITY): Payer: Self-pay | Admitting: Emergency Medicine

## 2019-12-26 ENCOUNTER — Emergency Department (HOSPITAL_COMMUNITY)
Admission: EM | Admit: 2019-12-26 | Discharge: 2019-12-27 | Disposition: A | Discharge status: Home / Self Care | Payer: Medicare Other | Attending: Emergency Medicine | Admitting: Emergency Medicine

## 2019-12-26 DIAGNOSIS — E119 Type 2 diabetes mellitus without complications: Secondary | ICD-10-CM | POA: Diagnosis not present

## 2019-12-26 DIAGNOSIS — I509 Heart failure, unspecified: Secondary | ICD-10-CM | POA: Diagnosis not present

## 2019-12-26 DIAGNOSIS — I110 Hypertensive heart disease with heart failure: Secondary | ICD-10-CM | POA: Insufficient documentation

## 2019-12-26 DIAGNOSIS — R509 Fever, unspecified: Secondary | ICD-10-CM | POA: Diagnosis not present

## 2019-12-26 DIAGNOSIS — Z20822 Contact with and (suspected) exposure to covid-19: Secondary | ICD-10-CM | POA: Insufficient documentation

## 2019-12-26 DIAGNOSIS — R7989 Other specified abnormal findings of blood chemistry: Secondary | ICD-10-CM | POA: Insufficient documentation

## 2019-12-26 DIAGNOSIS — R000 Tachycardia, unspecified: Secondary | ICD-10-CM | POA: Diagnosis not present

## 2019-12-26 DIAGNOSIS — J90 Pleural effusion, not elsewhere classified: Secondary | ICD-10-CM | POA: Insufficient documentation

## 2019-12-26 DIAGNOSIS — Z79899 Other long term (current) drug therapy: Secondary | ICD-10-CM | POA: Insufficient documentation

## 2019-12-26 DIAGNOSIS — R0602 Shortness of breath: Secondary | ICD-10-CM | POA: Diagnosis not present

## 2019-12-26 DIAGNOSIS — R778 Other specified abnormalities of plasma proteins: Secondary | ICD-10-CM

## 2019-12-26 DIAGNOSIS — Z7901 Long term (current) use of anticoagulants: Secondary | ICD-10-CM | POA: Insufficient documentation

## 2019-12-26 DIAGNOSIS — Z7984 Long term (current) use of oral hypoglycemic drugs: Secondary | ICD-10-CM | POA: Insufficient documentation

## 2019-12-26 LAB — CBC
HCT: 39.7 % (ref 39.0–52.0)
Hemoglobin: 12.5 g/dL — ABNORMAL LOW (ref 13.0–17.0)
MCH: 29 pg (ref 26.0–34.0)
MCHC: 31.5 g/dL (ref 30.0–36.0)
MCV: 92.1 fL (ref 80.0–100.0)
Platelets: 231 10*3/uL (ref 150–400)
RBC: 4.31 MIL/uL (ref 4.22–5.81)
RDW: 16.4 % — ABNORMAL HIGH (ref 11.5–15.5)
WBC: 10 10*3/uL (ref 4.0–10.5)
nRBC: 0 % (ref 0.0–0.2)

## 2019-12-26 LAB — COMPREHENSIVE METABOLIC PANEL
ALT: 29 U/L (ref 0–44)
AST: 27 U/L (ref 15–41)
Albumin: 3.5 g/dL (ref 3.5–5.0)
Alkaline Phosphatase: 65 U/L (ref 38–126)
Anion gap: 11 (ref 5–15)
BUN: 13 mg/dL (ref 8–23)
CO2: 24 mmol/L (ref 22–32)
Calcium: 9.2 mg/dL (ref 8.9–10.3)
Chloride: 105 mmol/L (ref 98–111)
Creatinine, Ser: 1.24 mg/dL (ref 0.61–1.24)
GFR calc Af Amer: >60 mL/min (ref >60)
GFR calc non Af Amer: 59 mL/min — ABNORMAL LOW (ref >60)
Glucose, Bld: 185 mg/dL — ABNORMAL HIGH (ref 70–99)
Potassium: 3.8 mmol/L (ref 3.5–5.1)
Sodium: 140 mmol/L (ref 135–145)
Total Bilirubin: 0.9 mg/dL (ref 0.3–1.2)
Total Protein: 6.9 g/dL (ref 6.5–8.1)

## 2019-12-26 MED ORDER — ACETAMINOPHEN 325 MG PO TABS
650.0000 mg | ORAL_TABLET | Freq: Once | ORAL | Status: AC
  Administered 2019-12-26: 650 mg via ORAL
  Filled 2019-12-26: qty 2

## 2019-12-26 MED ORDER — SODIUM CHLORIDE 0.9% FLUSH: 3.0000 mL | Freq: Once | INTRAVENOUS | Status: DC

## 2019-12-26 NOTE — ED Provider Notes (Signed)
[place] EMERGENCY DEPARTMENT Provider Note   CSN: [id number] Arrival date & time: 12/26/19  2249     History Chief Complaint  Patient presents with  . Shortness of Breath    Sean [name]. is a 69 y.o. male.  HPI     This is a 69 year old male with history of atrial fibrillation on Eliquis, CHF, diabetes, hypertension, hyperlipidemia who presents with fever and shortness of breath.  Patient reports that he had a cardiac ablation on Friday by Dr. Curt Bears.  He states that everything went well.  Patient reports that he got home and started developing some shortness of breath.  No cough.  He states that today he has had temperatures up to 101 with chills.  Denies chest pain, abdominal pain, nausea, vomiting, diarrhea.  Denies any sick contacts or Covid exposures.  He denies pain anywhere.  Temperature 102.8 in triage.  He did take Tylenol and repeat temperature 98.4.  Patient does report that he received his second dose of Covid vaccination on 2/24.  Past Medical History:  Diagnosis Date  . Allergic rhinitis   . Arrhythmia   . Atrial fibrillation ([place])   . CHF (congestive heart failure) ([place])   . Diabetes mellitus without complication ([place])   . Hyperlipidemia   . Hypertension     Patient Active Problem List   Diagnosis Date Noted  . Upper airway cough syndrome 12/15/2019  . DOE (dyspnea on exertion) 10/29/2019  . Persistent atrial fibrillation ([place]) 09/29/2019  . Essential hypertension 09/29/2019  . Mixed dyslipidemia 09/29/2019  . Diabetes mellitus due to underlying condition with unspecified complications ([place]) [id number]  . History of pulmonary embolism 09/29/2019  . Morbid obesity ([place]) 09/29/2019  . Benign prostatic hyperplasia 11/29/2018  . GERD (gastroesophageal reflux disease) 11/29/2018  . IBS (irritable bowel syndrome) 11/29/2018  . Fissure in skin of foot 12/11/2016  . Overgrown toenails 12/11/2016  . Plantar fasciitis, bilateral  06/05/2016    Past Surgical History:  Procedure Laterality Date  . CARDIAC CATHETERIZATION    . KNEE SURGERY    . PILONIDAL CYST / SINUS EXCISION         Family History  Problem Relation Age of Onset  . Hyperlipidemia Father   . Hypertension Father     Social History   Tobacco Use  . Smoking status: Never Smoker  . Smokeless tobacco: Never Used  Substance Use Topics  . Alcohol use: Not Currently  . Drug use: Never    Home Medications Prior to Admission medications   Medication Sig Start Date End Date Taking? Authorizing Provider  albuterol (PROVENTIL) (2.5 MG/3ML) 0.083% nebulizer solution Take 2.5 mg by nebulization every 6 (six) hours as needed for wheezing or shortness of breath.   Yes [provider]  albuterol (VENTOLIN HFA) 108 (90 Base) MCG/ACT inhaler Inhale 2 puffs into the lungs every 6 (six) hours as needed for wheezing or shortness of breath.   Yes [provider]  allopurinol (ZYLOPRIM) 100 MG tablet Take 100 mg by mouth daily.   Yes [provider]  amiodarone (PACERONE) 200 MG tablet Take 200 mg by mouth daily.  10/24/19  Yes [provider]  apixaban (ELIQUIS) 5 MG TABS tablet Take 1 tablet (5 mg total) by mouth 2 (two) times daily. 10/16/19  Yes [name], Reita Cliche, MD  Ascorbic Acid (VITAMIN C) 1000 MG tablet Take 1,000 mg by mouth 2 (two) times daily.   Yes [provider]  atorvastatin (LIPITOR) 20 MG  tablet Take 1 tablet (20 mg total) by mouth daily. 09/29/19 12/28/19 Yes [name], Reita Cliche, MD  B Complex-C (SUPER B COMPLEX PO) Take 1 capsule by mouth daily.    Yes [provider]  Cholecalciferol (VITAMIN D3) 50 MCG (2000 UT) TABS Take 2,000 Units by mouth 2 (two) times daily.    Yes [provider]  empagliflozin (JARDIANCE) 25 MG TABS tablet Take 25 mg by mouth daily.   Yes [provider]  ENTRESTO 24-26 MG TAKE ONE TABLET BY MOUTH TWICE DAILY Patient taking differently: Take 0.5  tablets by mouth in the morning and at bedtime.  10/14/19  Yes [name], Reita Cliche, MD  eszopiclone (LUNESTA) 2 MG TABS tablet Take 1 mg by mouth at bedtime.  12/07/19  Yes [provider]  fluticasone (FLONASE) 50 MCG/ACT nasal spray Place 2 sprays into both nostrils daily as needed for allergies or rhinitis.   Yes [provider]  furosemide (LASIX) 40 MG tablet Take 40 mg by mouth daily as needed for edema. 12/15/19  Yes [provider]  Insulin Glargine (TOUJEO SOLOSTAR [place]) Inject 90 Units into the skin at bedtime.   Yes [provider]  metoprolol tartrate (LOPRESSOR) 25 MG tablet Take 25 mg by mouth 2 (two) times daily. 09/16/19  Yes [provider]  montelukast (SINGULAIR) 10 MG tablet Take 10 mg by mouth at bedtime.   Yes [provider]  Omega-3 Fatty Acids (FISH OIL) 1200 MG CAPS Take 1,200 mg by mouth 2 (two) times daily.    Yes [provider]  omeprazole (PRILOSEC) 40 MG capsule Take 40 mg by mouth 2 (two) times daily.    Yes [provider]  tamsulosin (FLOMAX) 0.4 MG CAPS capsule Take 0.4 capsules by mouth 2 (two) times daily.   Yes [provider]  venlafaxine XR (EFFEXOR-XR) 150 MG 24 hr capsule Take 150 mg by mouth daily.    Yes [provider]  Vitamin D, Ergocalciferol, (DRISDOL) 1.25 MG (50000 UT) CAPS capsule Take 50,000 Units by mouth every Tuesday.    Yes [provider]  doxycycline (VIBRAMYCIN) 100 MG capsule Take 1 capsule (100 mg total) by mouth 2 (two) times daily. 12/27/19   Hawke Villalpando, Barbette Hair, MD  rOPINIRole (REQUIP) 2 MG tablet Take 2 mg by mouth at bedtime.    [provider]    Allergies    Prednisone and Victoza [liraglutide]  Review of Systems   Review of Systems  Constitutional: Positive for chills and fever.  Respiratory: Positive for shortness of breath. Negative for cough.   Cardiovascular: Negative for chest pain.  Gastrointestinal: Negative for  abdominal pain, nausea and vomiting.  Genitourinary: Negative for dysuria.  Skin: Negative for rash.  All other systems reviewed and are negative.   Physical Exam Updated Vital Signs BP 130/62   Pulse 88   Temp 98.4 F (36.9 C) (Oral)   Resp 18   Ht 1.88 m (6\' 2" )   Wt (!) 145 kg   SpO2 94%   BMI 41.04 kg/m   Physical Exam Vitals and nursing note reviewed.  Constitutional:      Appearance: He is well-developed. He is obese. He is diaphoretic. He is not ill-appearing.  HENT:     Head: Normocephalic and atraumatic.  Eyes:     Pupils: Pupils are equal, round, and reactive to light.  Cardiovascular:     Rate and Rhythm: Normal rate and regular rhythm.     Heart sounds: Normal  heart sounds. No murmur.  Pulmonary:     Effort: Pulmonary effort is normal. No respiratory distress.     Breath sounds: Normal breath sounds. No wheezing or rhonchi.  Abdominal:     General: Bowel sounds are normal.     Palpations: Abdomen is soft.     Tenderness: There is no abdominal tenderness. There is no rebound.  Musculoskeletal:     Cervical back: Neck supple.     Right lower leg: Edema present.     Left lower leg: Edema present.  Lymphadenopathy:     Cervical: No cervical adenopathy.  Skin:    General: Skin is warm.     Comments: Bilateral groin sites without obvious erythema or infection, some bruising noted over the left groin  Neurological:     Mental Status: He is alert and oriented to person, place, and time.  Psychiatric:        Mood and Affect: Mood normal.     ED Results / Procedures / Treatments   Labs (all labs ordered are listed, but only abnormal results are displayed) Labs Reviewed  CBC - Abnormal; Notable for the following components:      Result Value   Hemoglobin 12.5 (*)    RDW 16.4 (*)    All other components within normal limits  COMPREHENSIVE METABOLIC PANEL - Abnormal; Notable for the following components:   Glucose, Bld 185 (*)    GFR calc non Af Amer 59  (*)    All other components within normal limits  URINALYSIS, ROUTINE W REFLEX MICROSCOPIC - Abnormal; Notable for the following components:   Glucose, UA >=500 (*)    Ketones, ur 5 (*)    Protein, ur 30 (*)    All other components within normal limits  TROPONIN I (HIGH SENSITIVITY) - Abnormal; Notable for the following components:   Troponin I (High Sensitivity) 273 (*)    All other components within normal limits  TROPONIN I (HIGH SENSITIVITY) - Abnormal; Notable for the following components:   Troponin I (High Sensitivity) 253 (*)    All other components within normal limits  CULTURE, BLOOD (ROUTINE X 2)  CULTURE, BLOOD (ROUTINE X 2)  LACTIC ACID, PLASMA  LACTIC ACID, PLASMA  POC SARS CORONAVIRUS 2 AG -  ED    EKG EKG Interpretation  Date/Time:  Saturday December 26 2019 23:45:07 EST Ventricular Rate:  100 PR Interval:    QRS Duration: 106 QT Interval:  493 QTC Calculation: 636 R Axis:   22 Text Interpretation: Sinus tachycardia Low voltage, precordial leads Prolonged QT interval Confirmed by Thayer Jew [phone]) on 12/27/2019 12:03:25 AM   Radiology DG Chest 2 View  Result Date: 12/26/2019 CLINICAL DATA:  69 year old male with shortness of breath. EXAM: CHEST - 2 VIEW COMPARISON:  Chest radiograph dated 10/29/2019. FINDINGS: There are bibasilar atelectatic changes. A small left pleural effusion may be present. No focal consolidation or pneumothorax. There is mild cardiomegaly. Stable hiatal hernia. No acute osseous pathology. IMPRESSION: 1. Probable trace left pleural effusion and left lung base atelectasis. No focal consolidation. 2. Stable cardiomegaly and moderate size hiatal hernia. Electronically Signed   By: Anner Crete M.D.   On: 12/26/2019 23:26   EP STUDY  Result Date: 12/25/2019 SURGEON:  Allegra Lai, MD PREPROCEDURE DIAGNOSES: 1. Persistent atrial fibrillation. POSTPROCEDURE DIAGNOSES: 1. Persistent atrial fibrillation. PROCEDURES: 1. Comprehensive  electrophysiologic study. 2. Coronary sinus pacing and recording. 3. Three-dimensional mapping of atrial fibrillation (with additional mapping and ablation within the left atrium  due to persistence of afib) 4. Ablation of atrial fibrillation (with additional mapping and ablation within the left atrium due to persistence of afib) 5. Intracardiac echocardiography. 6. Transseptal puncture of an intact septum. 7. Arrhythmia induction with pacing 8. External cardioversion. INTRODUCTION:  Sean [name]. is a 69 y.o. male with a history of persistent atrial fibrillation who now presents for EP study and radiofrequency ablation.  The patient reports initially being diagnosed with atrial fibrillation after presenting with symptomatic palpitations and fatgiue.  The patient has failed medical therapy.  The patient therefore presents today for catheter ablation of atrial fibrillation. DESCRIPTION OF PROCEDURE:  Informed written consent was obtained, and the patient was brought to the electrophysiology lab in a fasting state.  The patient was adequately sedated with intravenous medications as outlined in the anesthesia report.  The patient's left and right groins were prepped and draped in the usual sterile fashion by the EP lab staff.  Using a percutaneous Seldinger technique, two 7-French and one 11-French hemostasis sheaths were placed into the right common femoral vein.  An esophageal temperature probe was inserted to monitor for heating of the esophagus during the procedure. Direct ultrasound guidance is used for right and left femoral veins with normal vessel patency. Ultrasound images are captured and stored in the patient's chart. Using ultrasound guidance, the Brockenbrough needle and wire were visualized entering the vessel. Catheter Placement:  A 7-French [employer] Decapolar coronary sinus catheter was introduced through the right common femoral vein and advanced into the coronary sinus for recording and  pacing from this location.    Initial Measurements: The patient presented to the electrophysiology lab in atrial fibrillation.   The average RR interval measured 978 msec.   Intracardiac Echocardiography: A 10-French [employer] Webster AcuNav intracardiac echocardiography catheter was introduced through the right common femoral vein and advanced into the right atrium. Intracardiac echocardiography was performed of the left atrium, and a three-dimensional anatomical rendering of the left atrium was performed using CARTO sound technology.  The patient was noted to have a moderate sized left atrium.  The interatrial septum was prominent but not aneurysmal. All 4 pulmonary veins were visualized and noted to have separate ostia.  The pulmonary veins were moderate in size.  The left atrial appendage was visualized and did not reveal thrombus.   There was no evidence of pulmonary vein stenosis. Transseptal Puncture: The right common femoral vein sheaths were was exchanged for an 8.5 [place] Agillis transseptal sheath and transseptal access was achieved in a standard fashion using a Brockenbrough needle under biplane fluoroscopy with intracardiac echocardiography confirmation of the transseptal puncture.  Once transseptal access had been achieved, heparin was administered intravenously and intra- arterially in order to maintain an ACT of greater than 350 seconds throughout the procedure.  3D Mapping and Ablation: A 3.5 mm [employer] Thermocool ablation catheter was advanced into the right atrium.  The transseptal sheath was pulled back into the IVC over a guidewire.  The ablation catheter was advanced across the transseptal hole using the wire as a guide.  The transseptal sheath was then re-advanced over the guidewire into the left atrium.  A duodecapolar Biosense Webster circular mapping catheter was introduced through the transseptal sheath and positioned over the mouth of all 4 pulmonary veins.   Three-dimensional electroanatomical mapping was performed using CARTO technology.  This demonstrated electrical activity within all four pulmonary veins at baseline. The patient underwent successful sequential electrical isolation and anatomical encircling of all four  pulmonary veins using radiofrequency current with a circular mapping catheter as a guide. A WACA approach was used. Dobutamine at 20 mcg/kg/min was infused post ablation to check for any further arrhythmias. Due to persistence of atrial fibrillation, additional left atrial mapping and ablation was performed.  A series of radiofrequency lesions were delivered along the roof of the left atrium. Cardioversion: The patient was then cardioverted to sinus rhythm with a single synchronized 250-J biphasic shock with cardioversion electrodes in the anterior-posterior thoracic configuration. he maintained sinus rhythm thereafter. Measurements Following Ablation: In sinus rhythm the RR interval was 925msec, with PR 259 msec, QRS 108 msec, and QT 495 msec.  Following ablation the AH interval measured 135 msec with an HV interval of 63 msec. Ventricular pacing was performed, which revealed VA dissociation. Rapid atrial pacing was performed, which revealed an AV Wenckebach cycle length of 430 msec.  Electroisolation was then again confirmed in all four pulmonary veins. The procedure was therefore considered completed.  All catheters were removed, and the sheaths were aspirated and flushed.  The patient was transferred to the recovery area for sheath removal per protocol.  Intracardiac echocardiogram revealed no pericardial effusion. EBL<45ml.  There were no early apparent complications. CONCLUSIONS: 1. Atrial fibrillation upon presentation.  2. Successful electrical isolation and anatomical encircling of all four pulmonary veins with radiofrequency current.  A WACA approach was used 3. Additional left atrial ablation was performed along the roof of the left atrium  4. Atrial fibrillation successfully cardioverted to sinus rhythm. 5. No early apparent complications. Sean Hassell Done Camnitz,MD 2:38 PM 12/25/2019    Procedures Procedures (including critical care time)  CRITICAL CARE Performed by: Merryl Hacker   Total critical care time: 35 minutes  Critical care time was exclusive of separately billable procedures and treating other patients.  Critical care was necessary to treat or prevent imminent or life-threatening deterioration.  Critical care was time spent personally by me on the following activities: development of treatment plan with patient and/or surrogate as well as nursing, discussions with consultants, evaluation of patient's response to treatment, examination of patient, obtaining history from patient or surrogate, ordering and performing treatments and interventions, ordering and review of laboratory studies, ordering and review of radiographic studies, pulse oximetry and re-evaluation of patient's condition.   Medications Ordered in ED Medications  sodium chloride flush (NS) 0.9 % injection 3 mL (has no administration in time range)  acetaminophen (TYLENOL) tablet 650 mg (650 mg Oral Given 12/26/19 2302)  cefTRIAXone (ROCEPHIN) 1 g in sodium chloride 0.9 % 100 mL IVPB (0 g Intravenous Stopped 12/27/19 0213)  azithromycin (ZITHROMAX) 500 mg in sodium chloride 0.9 % 250 mL IVPB (500 mg Intravenous New Bag/Given 12/27/19 0212)    ED Course  I have reviewed the triage vital signs and the nursing notes.  Pertinent labs & imaging results that were available during my care of the patient were reviewed by me and considered in my medical decision making (see chart for details).    MDM Rules/Calculators/A&P                       Patient presents with fever and shortness of breath.  Recent cardiac ablation.  He also recently had his second Covid vaccination.  He was febrile but nontoxic on initial evaluation.  His physical exam is fairly  benign.  Groin sites do not appear infected.  He is not having any chest pain.  EKG without acute ischemic changes.  Chest  x-ray shows probable trace left pleural effusion and atelectasis.  No evidence of volume overload.  Patient did have an elevated troponin at 273.  This is likely related to recent ablation.  Again he is pain-free.  Repeat troponin is 253.  I did discuss the patient with cardiology.  This is to be expected.  Given he is pain-free, they feel it is reasonable for him to follow-up as an outpatient.  Given his fever, Sean treat him for community-acquired pneumonia given the pleural effusion on chest x-ray.  X-ray may also be related to his second vaccine.  On multiple rechecks, patient is well-appearing.  O2 sats greater than 94%.  Covid testing is negative.  Sean [name]. was evaluated in Emergency Department on 12/27/2019 for the symptoms described in the history of present illness. He was evaluated in the context of the global COVID-19 pandemic, which necessitated consideration that the patient might be at risk for infection with the SARS-CoV-2 virus that causes COVID-19. Institutional protocols and algorithms that pertain to the evaluation of patients at risk for COVID-19 are in a state of rapid change based on information released by regulatory bodies including the CDC and federal and state organizations. These policies and algorithms were followed during the patient's care in the ED.  After history, exam, and medical workup I feel the patient has been appropriately medically screened and is safe for discharge home. Pertinent diagnoses were discussed with the patient. Patient was given return precautions.   Final Clinical Impression(s) / ED Diagnoses Final diagnoses:  Fever, unspecified fever cause  Pleural effusion  Elevated troponin    Rx / DC Orders ED Discharge Orders         Ordered    doxycycline (VIBRAMYCIN) 100 MG capsule  2 times daily     12/27/19 0320            Erlin Gardella, Barbette Hair, MD 12/27/19 [phone]

## 2019-12-26 NOTE — ED Triage Notes (Signed)
Patient reports SOB with fever onset last night , he had an ablation done here yesterday , denies chest pain , no emesis or diaphoresis .

## 2019-12-27 DIAGNOSIS — J90 Pleural effusion, not elsewhere classified: Secondary | ICD-10-CM | POA: Diagnosis not present

## 2019-12-27 LAB — URINALYSIS, ROUTINE W REFLEX MICROSCOPIC
Bacteria, UA: NONE SEEN
Bilirubin Urine: NEGATIVE
Glucose, UA: >=500 mg/dL — AB
Hgb urine dipstick: NEGATIVE
Ketones, ur: 5 mg/dL — AB
Leukocytes,Ua: NEGATIVE
Nitrite: NEGATIVE
Protein, ur: 30 mg/dL — AB
Specific Gravity, Urine: 1.027 (ref 1.005–1.030)
pH: 7 (ref 5.0–8.0)

## 2019-12-27 LAB — TROPONIN I (HIGH SENSITIVITY)
Troponin I (High Sensitivity): 273 ng/L (ref <18)
Troponin I (High Sensitivity): 253 ng/L (ref <18)

## 2019-12-27 LAB — LACTIC ACID, PLASMA: Lactic Acid, Venous: 1.3 mmol/L (ref 0.5–1.9)

## 2019-12-27 LAB — POC SARS CORONAVIRUS 2 AG -  ED: SARS Coronavirus 2 Ag: NEGATIVE

## 2019-12-27 MED ORDER — SODIUM CHLORIDE 0.9 % IV SOLN
1.0000 g | Freq: Once | INTRAVENOUS | Status: AC
  Administered 2019-12-27: 1 g via INTRAVENOUS
  Filled 2019-12-27: qty 10

## 2019-12-27 MED ORDER — DOXYCYCLINE HYCLATE 100 MG PO CAPS: 100.0000 mg | ORAL_CAPSULE | Freq: Two times a day (BID) | ORAL | 0 refills | Status: DC

## 2019-12-27 MED ORDER — SODIUM CHLORIDE 0.9 % IV SOLN
500.0000 mg | Freq: Once | INTRAVENOUS | Status: AC
  Administered 2019-12-27: 02:00:00 500 mg via INTRAVENOUS
  Filled 2019-12-27: qty 500

## 2019-12-27 NOTE — Discharge Instructions (Signed)
You were seen today for fever.  This may be related to your recent vaccine but you also have evidence of possible pneumonia.  Take antibiotics as prescribed.  Follow-up closely with your primary physician and your cardiologist.  You did have a slight elevation in your heart test but this is to be expected after your procedure.  If you develop worsening shortness of breath, ongoing fevers not improving with Tylenol and antibiotics, or any new or worsening symptoms you should be reevaluated.

## 2019-12-27 NOTE — ED Notes (Signed)
Informed RN Annie Main pt POC covid test neg

## 2019-12-28 DIAGNOSIS — D509 Iron deficiency anemia, unspecified: Secondary | ICD-10-CM | POA: Diagnosis not present

## 2019-12-28 DIAGNOSIS — I4891 Unspecified atrial fibrillation: Secondary | ICD-10-CM | POA: Diagnosis not present

## 2019-12-28 DIAGNOSIS — J189 Pneumonia, unspecified organism: Secondary | ICD-10-CM | POA: Diagnosis not present

## 2019-12-28 DIAGNOSIS — Z09 Encounter for follow-up examination after completed treatment for conditions other than malignant neoplasm: Secondary | ICD-10-CM | POA: Diagnosis not present

## 2019-12-29 ENCOUNTER — Telehealth: Payer: Self-pay | Admitting: Cardiology

## 2019-12-29 NOTE — Telephone Encounter (Signed)
lmtcb

## 2019-12-29 NOTE — Telephone Encounter (Signed)
Sean [name] is returning [employer].

## 2019-12-29 NOTE — Telephone Encounter (Signed)
lmtcb to discuss MyChart message sent: (Dr Curt Bears asked me to follow up w/ pt)  Dr Curt Bears,  Please be aware of my visit to [facility] Emergency care on Saturday, Feb 28th. The reason for the visit was breathing issues and spiking temperatures.  Please see MRN: [id number] for notes on visit.  I received 2 IV meds for pneumonia and was prescribed an antibiotic by mouth.  I thought it would be wise to mention this to you.  If you have any recommendations or questions, please feel free to call my cell at [phone]  L Schuyler Amor

## 2019-12-29 NOTE — Telephone Encounter (Signed)
See 3/2 telephone note for further documentation

## 2019-12-31 LAB — CULTURE, BLOOD (ROUTINE X 2)
Culture: NO GROWTH
Culture: NO GROWTH

## 2020-01-01 DIAGNOSIS — Z1331 Encounter for screening for depression: Secondary | ICD-10-CM | POA: Diagnosis not present

## 2020-01-01 DIAGNOSIS — E785 Hyperlipidemia, unspecified: Secondary | ICD-10-CM | POA: Diagnosis not present

## 2020-01-01 DIAGNOSIS — Z9181 History of falling: Secondary | ICD-10-CM | POA: Diagnosis not present

## 2020-01-01 DIAGNOSIS — Z6841 Body Mass Index (BMI) 40.0 and over, adult: Secondary | ICD-10-CM | POA: Diagnosis not present

## 2020-01-01 DIAGNOSIS — Z125 Encounter for screening for malignant neoplasm of prostate: Secondary | ICD-10-CM | POA: Diagnosis not present

## 2020-01-01 DIAGNOSIS — Z0000 Encounter for general adult medical examination without abnormal findings: Secondary | ICD-10-CM | POA: Diagnosis not present

## 2020-01-01 DIAGNOSIS — Z139 Encounter for screening, unspecified: Secondary | ICD-10-CM | POA: Diagnosis not present

## 2020-01-05 DIAGNOSIS — E785 Hyperlipidemia, unspecified: Secondary | ICD-10-CM | POA: Diagnosis not present

## 2020-01-05 DIAGNOSIS — I10 Essential (primary) hypertension: Secondary | ICD-10-CM | POA: Diagnosis not present

## 2020-01-05 DIAGNOSIS — Z6841 Body Mass Index (BMI) 40.0 and over, adult: Secondary | ICD-10-CM | POA: Diagnosis not present

## 2020-01-07 NOTE — Telephone Encounter (Signed)
Finally able to speak w/ pt. Pt reports doing fine, no issues since going to hospital weekend before last. He appreciates our follow up.  He does report that MD, Dr. Cranston Neighbor [facility]) is recommending iron infusions and wants to make sure Dr. Curt Bears is agreeable to infusions and no cardiac reason he should not get them. Reports: Iron Serum 40 TIBC 439 Saturation 9.1 Hgb 13.1 Hct 41.5  Pt aware I will sent to Dr. Curt Bears for review/advisment.  Aware it may be next week before return call as I am out of the office tomorrow. Pt agreeable.

## 2020-01-08 NOTE — Telephone Encounter (Signed)
No issues with iron infusion.

## 2020-01-11 DIAGNOSIS — R195 Other fecal abnormalities: Secondary | ICD-10-CM | POA: Diagnosis not present

## 2020-01-11 DIAGNOSIS — K219 Gastro-esophageal reflux disease without esophagitis: Secondary | ICD-10-CM | POA: Diagnosis not present

## 2020-01-11 NOTE — Telephone Encounter (Signed)
Left detailed message per DPR that Dr. Curt Bears said that there are no issues with an iron infusion. Instructed patient to call back with any questions or concerns.

## 2020-01-12 ENCOUNTER — Other Ambulatory Visit: Payer: Self-pay | Admitting: Cardiology

## 2020-01-13 NOTE — Telephone Encounter (Signed)
1 M 40.4 kg, Scr 1.24 (1/21), LOV 1/21 [name]

## 2020-01-21 DIAGNOSIS — E119 Type 2 diabetes mellitus without complications: Secondary | ICD-10-CM | POA: Diagnosis not present

## 2020-01-21 DIAGNOSIS — R600 Localized edema: Secondary | ICD-10-CM | POA: Diagnosis not present

## 2020-01-21 DIAGNOSIS — I10 Essential (primary) hypertension: Secondary | ICD-10-CM | POA: Diagnosis not present

## 2020-01-21 DIAGNOSIS — D649 Anemia, unspecified: Secondary | ICD-10-CM | POA: Diagnosis not present

## 2020-01-21 NOTE — Progress Notes (Signed)
Primary Care Physician: [name], MD Primary Cardiologist: Dr Geraldo Pitter Primary Electrophysiologist: Dr Curt Bears Referring Physician: Dr Jeri Lager Sean [name]. is a 69 y.o. male with a history of hypertension, hyperlipidemia, CHF, and atrial fibrillation who presents for follow up in the [facility].  He had an attempted cardioversion 11/05/2019 but unfortunately did not return to sinus rhythm despite amiodarone.  He has been having symptoms of weakness, fatigue and shortness of breath. He had an echocardiogram that showed an ejection fraction of 35 to 40%. Patient is on Eliquis for a CHADS2VASC score of 4.   On follow up today, patient is s/p afib ablation with Dr Curt Bears on 12/25/19. He denies any symptoms of afib since the procedure. He was treated at the ER the next day for PNA. He has completed his abx. He denies any CP, swallowing or groin issues.  Today, he denies symptoms of palpitations, chest pain, shortness of breath, orthopnea, PND, lower extremity edema, dizziness, presyncope, syncope, snoring, daytime somnolence, bleeding, or neurologic sequela. The patient is tolerating medications without difficulties and is otherwise without complaint today.    Atrial Fibrillation Risk Factors:  he does not have symptoms or diagnosis of sleep apnea. he does not have a history of rheumatic fever.   he has a BMI of Body mass index is 39.75 kg/m.Marland Kitchen Filed Weights   01/22/20 1026  Weight: (!) 140.4 kg    Family History  Problem Relation Age of Onset  . Hyperlipidemia Father   . Hypertension Father      Atrial Fibrillation Management history:  Previous antiarrhythmic drugs: amiodarone Previous cardioversions: 11/05/19 Previous ablations: 12/25/19 CHADS2VASC score: 4 Anticoagulation history: Eliquis   Past Medical History:  Diagnosis Date  . Allergic rhinitis   . Arrhythmia   . Atrial fibrillation ([place])   . CHF (congestive heart failure) ([place])     . Diabetes mellitus without complication ([place])   . Hyperlipidemia   . Hypertension    Past Surgical History:  Procedure Laterality Date  . ATRIAL FIBRILLATION ABLATION N/A 12/25/2019   Procedure: ATRIAL FIBRILLATION ABLATION;  Surgeon: [name], MD;  Location: [facility];  Service: Cardiovascular;  Laterality: N/A;  . CARDIAC CATHETERIZATION    . KNEE SURGERY    . PILONIDAL CYST / SINUS EXCISION      Current Outpatient Medications  Medication Sig Dispense Refill  . albuterol (PROVENTIL) (2.5 MG/3ML) 0.083% nebulizer solution Take 2.5 mg by nebulization daily as needed for wheezing or shortness of breath.     Marland Kitchen albuterol (VENTOLIN HFA) 108 (90 Base) MCG/ACT inhaler Inhale 2 puffs into the lungs as needed for wheezing or shortness of breath.     . allopurinol (ZYLOPRIM) 100 MG tablet Take 100 mg by mouth daily.    Marland Kitchen amiodarone (PACERONE) 200 MG tablet Take 200 mg by mouth daily.     Marland Kitchen amLODipine (NORVASC) 5 MG tablet Take 10 mg by mouth daily.    . Ascorbic Acid (VITAMIN C) 1000 MG tablet Take 1,000 mg by mouth 2 (two) times daily.    Marland Kitchen atorvastatin (LIPITOR) 20 MG tablet Take 1 tablet (20 mg total) by mouth daily. 30 tablet 3  . B Complex-C (SUPER B COMPLEX PO) Take 1 capsule by mouth daily.     . Cholecalciferol (VITAMIN D3) 50 MCG (2000 UT) TABS Take 2,000 Units by mouth 2 (two) times daily.     Marland Kitchen ELIQUIS 5 MG TABS tablet Take 1 tablet (5  mg total) by mouth 2 (two) times daily. 180 tablet 1  . empagliflozin (JARDIANCE) 25 MG TABS tablet Take 25 mg by mouth daily.    Marland Kitchen ENTRESTO 24-26 MG TAKE ONE TABLET BY MOUTH TWICE DAILY 60 tablet 5  . eszopiclone (LUNESTA) 2 MG TABS tablet Take 1 mg by mouth at bedtime.     . fluticasone (FLONASE) 50 MCG/ACT nasal spray Place 2 sprays into both nostrils daily as needed for allergies or rhinitis.    . furosemide (LASIX) 40 MG tablet Take 40 mg by mouth daily as needed for edema.    . Insulin Glargine (TOUJEO SOLOSTAR [place]) Inject 90  Units into the skin at bedtime.    . metoprolol tartrate (LOPRESSOR) 25 MG tablet Take 25 mg by mouth 2 (two) times daily.    . montelukast (SINGULAIR) 10 MG tablet Take 10 mg by mouth at bedtime.    . Omega-3 Fatty Acids (FISH OIL) 1200 MG CAPS Take 1,200 mg by mouth 2 (two) times daily.     Marland Kitchen omeprazole (PRILOSEC) 40 MG capsule Take 40 mg by mouth 2 (two) times daily.     Marland Kitchen rOPINIRole (REQUIP) 2 MG tablet Take 2 mg by mouth at bedtime.    . tamsulosin (FLOMAX) 0.4 MG CAPS capsule Take 0.4 capsules by mouth 2 (two) times daily.    Marland Kitchen venlafaxine XR (EFFEXOR-XR) 150 MG 24 hr capsule Take 150 mg by mouth daily.     . Vitamin D, Ergocalciferol, (DRISDOL) 1.25 MG (50000 UT) CAPS capsule Take 50,000 Units by mouth every Tuesday.      No current facility-administered medications for this encounter.    Allergies  Allergen Reactions  . Prednisone Other (See Comments)    nervous  . Victoza [Liraglutide] Diarrhea    Social History   Socioeconomic History  . Marital status: Married    Spouse name: Not on file  . Number of children: Not on file  . Years of education: Not on file  . Highest education level: Not on file  Occupational History  . Not on file  Tobacco Use  . Smoking status: Never Smoker  . Smokeless tobacco: Never Used  Substance and Sexual Activity  . Alcohol use: Not Currently  . Drug use: Never  . Sexual activity: Not on file  Other Topics Concern  . Not on file  Social History Narrative  . Not on file   Social Determinants of Health   Financial Resource Strain:   . Difficulty of Paying Living Expenses:   Food Insecurity:   . Worried About Charity fundraiser in the Last Year:   . Arboriculturist in the Last Year:   Transportation Needs:   . Film/video editor (Medical):   Marland Kitchen Lack of Transportation (Non-Medical):   Physical Activity:   . Days of Exercise per Week:   . Minutes of Exercise per Session:   Stress:   . Feeling of Stress :   Social  Connections:   . Frequency of Communication with Friends and Family:   . Frequency of Social Gatherings with Friends and Family:   . Attends Religious Services:   . Active Member of Clubs or Organizations:   . Attends Archivist Meetings:   Marland Kitchen Marital Status:   Intimate Partner Violence:   . Fear of Current or Ex-Partner:   . Emotionally Abused:   Marland Kitchen Physically Abused:   . Sexually Abused:      ROS- All systems are reviewed  and negative except as per the HPI above.  Physical Exam: Vitals:   01/22/20 1026  BP: (!) 178/96  Pulse: 67  Weight: (!) 140.4 kg  Height: 6\' 2"  (1.88 m)    GEN- The patient is well appearing obese male, alert and oriented x 3 today.   HEENT-head normocephalic, atraumatic, sclera clear, conjunctiva pink, hearing intact, trachea midline. Lungs- Clear to ausculation bilaterally, normal work of breathing Heart- Regular rate and rhythm, no murmurs, rubs or gallops  GI- soft, NT, ND, + BS Extremities- no clubbing, cyanosis, or edema MS- no significant deformity or atrophy Skin- no rash or lesion Psych- euthymic mood, full affect Neuro- strength and sensation are intact   Wt Readings from Last 3 Encounters:  01/22/20 (!) 140.4 kg  12/26/19 (!) 145 kg  12/25/19 (!) 140.2 kg    EKG today demonstrates SR HR 67, 1st degree AV block, PR 270, QRS 90, QTc 424  Echo 09/14/19 demonstrated  Ejection fraction 35 to 40% Moderately dilated left ventricle Mildly enlarged right ventricle Moderately dilated left atrium  Epic records are reviewed at length today  CHA2DS2-VASc Score = 4 The patient's score is based upon: CHF History: Yes HTN History: Yes Age : 69-74 Diabetes History: Yes Stroke History: No Vascular Disease History: No Gender: Male      ASSESSMENT AND PLAN: 1. Persistent Atrial Fibrillation (ICD10:  I48.19) The patient's CHA2DS2-VASc score is 4, indicating a 4.8% annual risk of stroke.   S/p ablation with Dr Curt Bears  12/25/19. Patient appears to be maintaining SR. Continue Eliquis 5 mg BID with no missed doses for at least 3 months post ablation.  Continue amiodarone 200 mg daily Continue Lopressor 25 mg BID  2. Secondary Hypercoagulable State (ICD10:  D68.69) The patient is at significant risk for stroke/thromboembolism based upon his CHA2DS2-VASc Score of 4.  Continue Apixaban (Eliquis).   3. Obesity Body mass index is 39.75 kg/m. Lifestyle modification was discussed at length including regular exercise and weight reduction.  4. Chronic systolic CHF NICM EF [id number] No signs or symptoms of fluid overload.  5. HTN Elevated today, patient just started higher dose of Norvasc today. No changes.    Follow up with Dr Geraldo Pitter Dr Curt Bears as scheduled.    [facility] [address] [place], [place] [address] [phone] 01/22/2020 10:55 AM

## 2020-01-22 ENCOUNTER — Other Ambulatory Visit: Payer: Self-pay

## 2020-01-22 ENCOUNTER — Ambulatory Visit (HOSPITAL_COMMUNITY)
Admission: RE | Admit: 2020-01-22 | Discharge: 2020-01-22 | Disposition: A | Discharge status: Home / Self Care | Payer: Medicare Other | Source: Ambulatory Visit | Attending: Physician Assistant | Admitting: Physician Assistant

## 2020-01-22 VITALS — BP 178/96 | HR 67 | Ht 74.0 in | Wt 309.6 lb

## 2020-01-22 DIAGNOSIS — E119 Type 2 diabetes mellitus without complications: Secondary | ICD-10-CM | POA: Diagnosis not present

## 2020-01-22 DIAGNOSIS — Z8249 Family history of ischemic heart disease and other diseases of the circulatory system: Secondary | ICD-10-CM | POA: Insufficient documentation

## 2020-01-22 DIAGNOSIS — Z79899 Other long term (current) drug therapy: Secondary | ICD-10-CM | POA: Diagnosis not present

## 2020-01-22 DIAGNOSIS — Z6839 Body mass index (BMI) 39.0-39.9, adult: Secondary | ICD-10-CM | POA: Diagnosis not present

## 2020-01-22 DIAGNOSIS — I110 Hypertensive heart disease with heart failure: Secondary | ICD-10-CM | POA: Diagnosis not present

## 2020-01-22 DIAGNOSIS — D6869 Other thrombophilia: Secondary | ICD-10-CM | POA: Insufficient documentation

## 2020-01-22 DIAGNOSIS — Z794 Long term (current) use of insulin: Secondary | ICD-10-CM | POA: Diagnosis not present

## 2020-01-22 DIAGNOSIS — E785 Hyperlipidemia, unspecified: Secondary | ICD-10-CM | POA: Insufficient documentation

## 2020-01-22 DIAGNOSIS — I4819 Other persistent atrial fibrillation: Secondary | ICD-10-CM | POA: Diagnosis not present

## 2020-01-22 DIAGNOSIS — I5022 Chronic systolic (congestive) heart failure: Secondary | ICD-10-CM | POA: Insufficient documentation

## 2020-01-22 DIAGNOSIS — Z7901 Long term (current) use of anticoagulants: Secondary | ICD-10-CM | POA: Diagnosis not present

## 2020-01-22 DIAGNOSIS — E669 Obesity, unspecified: Secondary | ICD-10-CM | POA: Diagnosis not present

## 2020-01-22 HISTORY — DX: Other thrombophilia: D68.69

## 2020-01-25 NOTE — Telephone Encounter (Signed)
error 

## 2020-02-10 ENCOUNTER — Telehealth: Payer: Self-pay | Admitting: Cardiology

## 2020-02-10 DIAGNOSIS — K219 Gastro-esophageal reflux disease without esophagitis: Secondary | ICD-10-CM | POA: Diagnosis not present

## 2020-02-10 DIAGNOSIS — R600 Localized edema: Secondary | ICD-10-CM | POA: Diagnosis not present

## 2020-02-10 DIAGNOSIS — D649 Anemia, unspecified: Secondary | ICD-10-CM | POA: Diagnosis not present

## 2020-02-10 DIAGNOSIS — Z79899 Other long term (current) drug therapy: Secondary | ICD-10-CM

## 2020-02-10 DIAGNOSIS — I10 Essential (primary) hypertension: Secondary | ICD-10-CM | POA: Diagnosis not present

## 2020-02-10 MED ORDER — SACUBITRIL-VALSARTAN 49-51 MG PO TABS: 1.0000 | ORAL_TABLET | Freq: Two times a day (BID) | ORAL | 11 refills | Status: DC

## 2020-02-10 NOTE — Telephone Encounter (Signed)
Pt c/o medication issue:  1. Name of Medication: ENTRESTO 24-26 MG  2. How are you currently taking this medication (dosage and times per day)? 2 tablets twice daily per Dr. Rica Records  3. Are you having a reaction (difficulty breathing--STAT)? no  4. What is your medication issue? Sean [name] from Dr. Lauralee Evener office wanted to let Dr. Geraldo Pitter know that the patient is taking this medication differently than how we have it documented. Please call her if needed

## 2020-02-10 NOTE — Telephone Encounter (Signed)
*  STAT* If patient is at the pharmacy, call can be transferred to refill team.   1. Which medications need to be refilled? (please list name of each medication and dose if known) ENTRESTO 24-26 MG  2. Which pharmacy/location (including street and city if local pharmacy) is medication to be sent to? [place], [place]  3. Do they need a 30 day or 90 day supply? Patient states his PCP, Dr. Rica Records, advised that he double his intake of the medication. However, increase will cause patient to run out of medication quickly. He is requesting an additional supply of medication. Please assist.

## 2020-02-10 NOTE — Telephone Encounter (Signed)
Orders placed.

## 2020-02-10 NOTE — Telephone Encounter (Signed)
Mychart message was route today 4/14 for MD's approval to send increased dose for Entresto.

## 2020-02-10 NOTE — Telephone Encounter (Signed)
Please see updated encouter.

## 2020-02-10 NOTE — Telephone Encounter (Signed)
Yes dose can be increased.  Please have a patient keep a track of her blood pressures regularly and send it to me by my chart or put it in the mail.  Chem-7 in 2 weeks.

## 2020-02-12 DIAGNOSIS — D649 Anemia, unspecified: Secondary | ICD-10-CM | POA: Diagnosis not present

## 2020-02-19 DIAGNOSIS — D649 Anemia, unspecified: Secondary | ICD-10-CM | POA: Diagnosis not present

## 2020-02-22 DIAGNOSIS — R339 Retention of urine, unspecified: Secondary | ICD-10-CM

## 2020-02-22 DIAGNOSIS — R361 Hematospermia: Secondary | ICD-10-CM | POA: Insufficient documentation

## 2020-02-22 DIAGNOSIS — N529 Male erectile dysfunction, unspecified: Secondary | ICD-10-CM

## 2020-02-22 DIAGNOSIS — E291 Testicular hypofunction: Secondary | ICD-10-CM

## 2020-02-22 HISTORY — DX: Retention of urine, unspecified: R33.9

## 2020-02-22 HISTORY — DX: Hematospermia: R36.1

## 2020-02-22 HISTORY — DX: Testicular hypofunction: E29.1

## 2020-02-22 HISTORY — DX: Male erectile dysfunction, unspecified: N52.9

## 2020-02-23 ENCOUNTER — Ambulatory Visit (INDEPENDENT_AMBULATORY_CARE_PROVIDER_SITE_OTHER): Payer: Medicare Other | Admitting: Cardiology

## 2020-02-23 ENCOUNTER — Other Ambulatory Visit: Payer: Self-pay

## 2020-02-23 ENCOUNTER — Encounter: Payer: Self-pay | Admitting: Cardiology

## 2020-02-23 VITALS — BP 142/74 | HR 87 | Temp 98.2°F | Ht 74.0 in | Wt 308.4 lb

## 2020-02-23 DIAGNOSIS — I480 Paroxysmal atrial fibrillation: Secondary | ICD-10-CM | POA: Insufficient documentation

## 2020-02-23 DIAGNOSIS — Z86711 Personal history of pulmonary embolism: Secondary | ICD-10-CM | POA: Diagnosis not present

## 2020-02-23 DIAGNOSIS — I429 Cardiomyopathy, unspecified: Secondary | ICD-10-CM | POA: Insufficient documentation

## 2020-02-23 DIAGNOSIS — E088 Diabetes mellitus due to underlying condition with unspecified complications: Secondary | ICD-10-CM

## 2020-02-23 DIAGNOSIS — Z8679 Personal history of other diseases of the circulatory system: Secondary | ICD-10-CM | POA: Diagnosis not present

## 2020-02-23 DIAGNOSIS — Z9889 Other specified postprocedural states: Secondary | ICD-10-CM

## 2020-02-23 DIAGNOSIS — E782 Mixed hyperlipidemia: Secondary | ICD-10-CM

## 2020-02-23 HISTORY — DX: Cardiomyopathy, unspecified: I42.9

## 2020-02-23 HISTORY — DX: Paroxysmal atrial fibrillation: I48.0

## 2020-02-23 HISTORY — DX: Other specified postprocedural states: Z98.890

## 2020-02-23 NOTE — Progress Notes (Signed)
Cardiology Office Note:    Date:  02/23/2020   ID:  Sean Prude., DOB 07-Sep-1951, MRN [id number]  PCP:  [name], MD  Cardiologist:  Jenean Lindau, MD   Referring MD: [name], MD    ASSESSMENT:    1. Diabetes mellitus due to underlying condition with unspecified complications ([place])   2. PAF (paroxysmal atrial fibrillation) ([place])   3. History of pulmonary embolism   4. Mixed dyslipidemia   5. Morbid obesity ([place])   6. Cardiomyopathy, unspecified type ([place])   7. S/P ablation of atrial fibrillation    PLAN:    In order of problems listed above:  1. Primary prevention stressed with the patient.  Importance of compliance with diet and medication stressed and he vocalized understanding. 2. Atrial fibrillation post ablation:I discussed with the patient atrial fibrillation, disease process. Management and therapy including rate and rhythm control, anticoagulation benefits and potential risks were discussed extensively with the patient. Patient had multiple questions which were answered to patient's satisfaction.  The atrial fibrillation ablation has been successful clinically. Essential hypertension: Blood pressure stable Mixed dyslipidemia and diabetes mellitus: Diet was emphasized weight reduction was stressed risk of obesity explained and he vocalized understanding.  Diet was emphasized.  I advised the patient to walk at least 30 minutes a day 5 times a week. Echocardiogram will be done to assess cardiomyopathy issues as he is out 2 months post ablation.  Hopefully his ejection fraction could possibly get better.  He has an appointment coming up with our electrophysiologist in a month and the results of the echocardiogram will likely be available to our electrophysiology colleague for review at the time of the visit. Patient will be seen in follow-up appointment in 6 months or earlier if the patient has any concerns  Medication Adjustments/Labs and Tests Ordered: Current  medicines are reviewed at length with the patient today.  Concerns regarding medicines are outlined above.  Orders Placed This Encounter  Procedures  . ECHOCARDIOGRAM COMPLETE   No orders of the defined types were placed in this encounter.    No chief complaint on file.    History of Present Illness:    Sean Plambeck. is a 69 y.o. male.  Patient has past medical history of atrial fibrillation post ablation, essential hypertension dyslipidemia diabetes mellitus morbid obesity and cardiomyopathy.  He denies any problems at this time and takes care of activities of daily living.  He mentions to me that since the ablation he has been in sinus rhythm.  At the time of my evaluation, the patient is alert awake oriented and in no distress.  He still has not lost significant amount of weight.  Past Medical History:  Diagnosis Date  . Allergic rhinitis   . Arrhythmia   . Atrial fibrillation ([place])   . Benign prostatic hyperplasia with urinary obstruction 11/29/2018  . CHF (congestive heart failure) ([place])   . Diabetes mellitus due to underlying condition with unspecified complications ([place]) [id number]  . Diabetes mellitus without complication ([place])   . DOE (dyspnea on exertion) 10/29/2019   S/p PE  11/2018 > DOAC recurrent 09/14/2019 off DOAC so resumed  - onset variable doe and orthostatic lightheadedness 06/2019 with afib - PFT's [place]  10/08/2019 :  FEV1 2.79 (73%) with ratio 75 and 6 % better p saba,  Air trapping on lung vol,  ERV 14% and dlco 22.78 (60%) corrects to 3.68 (76%) for vol with minimal concavity to f/v loop  - 10/29/2019  Walked RA x two laps =  approx 526ft @ avg  . Essential hypertension 09/29/2019  . Fissure in skin of foot 12/11/2016  . GERD (gastroesophageal reflux disease) 11/29/2018  . Hemospermia 02/22/2020  . History of pulmonary embolism 09/29/2019  . Hyperlipidemia   . Hypertension   . Hypogonadism in male 02/22/2020  . IBS (irritable bowel syndrome) 11/29/2018  .  Impotence 02/22/2020  . Incomplete emptying of bladder 02/22/2020  . Mixed dyslipidemia 09/29/2019  . Morbid obesity ([place]) 09/29/2019  . Overgrown toenails 12/11/2016  . Persistent atrial fibrillation ([place]) 09/29/2019  . Plantar fasciitis, bilateral 06/05/2016  . Secondary hypercoagulable state ([place]) 01/22/2020  . Upper airway cough syndrome 12/15/2019   Noted on exam 12/15/2019 onset while on Entresto, resolved with purse lip    Past Surgical History:  Procedure Laterality Date  . ATRIAL FIBRILLATION ABLATION N/A 12/25/2019   Procedure: ATRIAL FIBRILLATION ABLATION;  Surgeon: [name], MD;  Location: [facility];  Service: Cardiovascular;  Laterality: N/A;  . CARDIAC CATHETERIZATION    . KNEE SURGERY    . PILONIDAL CYST / SINUS EXCISION      Current Medications: Current Meds  Medication Sig  . albuterol (PROVENTIL) (2.5 MG/3ML) 0.083% nebulizer solution Take 2.5 mg by nebulization daily as needed for wheezing or shortness of breath.   Marland Kitchen albuterol (VENTOLIN HFA) 108 (90 Base) MCG/ACT inhaler Inhale 2 puffs into the lungs as needed for wheezing or shortness of breath.   . allopurinol (ZYLOPRIM) 100 MG tablet Take 100 mg by mouth daily.  Marland Kitchen amiodarone (PACERONE) 200 MG tablet Take 200 mg by mouth daily.   . Ascorbic Acid (VITAMIN C) 1000 MG tablet Take 1,000 mg by mouth 2 (two) times daily.  Marland Kitchen atorvastatin (LIPITOR) 20 MG tablet Take 1 tablet (20 mg total) by mouth daily.  . B Complex-C (SUPER B COMPLEX PO) Take 1 capsule by mouth daily.   . Cholecalciferol (VITAMIN D3) 50 MCG (2000 UT) TABS Take 2,000 Units by mouth 2 (two) times daily.   Marland Kitchen ELIQUIS 5 MG TABS tablet Take 1 tablet (5 mg total) by mouth 2 (two) times daily.  . empagliflozin (JARDIANCE) 25 MG TABS tablet Take 25 mg by mouth daily.  . eszopiclone (LUNESTA) 2 MG TABS tablet Take 1 mg by mouth at bedtime.   . fluticasone (FLONASE) 50 MCG/ACT nasal spray Place 2 sprays into both nostrils daily as needed for allergies  or rhinitis.  . furosemide (LASIX) 40 MG tablet Take 40 mg by mouth daily as needed for edema.  . hydrALAZINE (APRESOLINE) 50 MG tablet Take 50 mg by mouth every 8 (eight) hours.  . Insulin Glargine (TOUJEO SOLOSTAR [place]) Inject 90 Units into the skin at bedtime.  . metoprolol tartrate (LOPRESSOR) 25 MG tablet Take 25 mg by mouth 2 (two) times daily.  . montelukast (SINGULAIR) 10 MG tablet Take 10 mg by mouth at bedtime.  . Omega-3 Fatty Acids (FISH OIL) 1200 MG CAPS Take 1,200 mg by mouth 2 (two) times daily.   Marland Kitchen omeprazole (PRILOSEC) 40 MG capsule Take 40 mg by mouth 2 (two) times daily.   Marland Kitchen rOPINIRole (REQUIP) 2 MG tablet Take 2 mg by mouth at bedtime.  . sacubitril-valsartan (ENTRESTO) 49-51 MG Take 1 tablet by mouth 2 (two) times daily.  . tamsulosin (FLOMAX) 0.4 MG CAPS capsule Take 0.4 capsules by mouth 2 (two) times daily.  . traMADol (ULTRAM) 50 MG tablet Take 50 mg by mouth daily as needed.  . venlafaxine  XR (EFFEXOR-XR) 150 MG 24 hr capsule Take 150 mg by mouth daily.   . Vitamin D, Ergocalciferol, (DRISDOL) 1.25 MG (50000 UT) CAPS capsule Take 50,000 Units by mouth every Tuesday.      Allergies:   Prednisone and Victoza [liraglutide]   Social History   Socioeconomic History  . Marital status: Married    Spouse name: Not on file  . Number of children: Not on file  . Years of education: Not on file  . Highest education level: Not on file  Occupational History  . Not on file  Tobacco Use  . Smoking status: Never Smoker  . Smokeless tobacco: Never Used  Substance and Sexual Activity  . Alcohol use: Not Currently  . Drug use: Never  . Sexual activity: Not on file  Other Topics Concern  . Not on file  Social History Narrative  . Not on file   Social Determinants of Health   Financial Resource Strain:   . Difficulty of Paying Living Expenses:   Food Insecurity:   . Worried About Charity fundraiser in the Last Year:   . Arboriculturist in the Last Year:     Transportation Needs:   . Film/video editor (Medical):   Marland Kitchen Lack of Transportation (Non-Medical):   Physical Activity:   . Days of Exercise per Week:   . Minutes of Exercise per Session:   Stress:   . Feeling of Stress :   Social Connections:   . Frequency of Communication with Friends and Family:   . Frequency of Social Gatherings with Friends and Family:   . Attends Religious Services:   . Active Member of Clubs or Organizations:   . Attends Archivist Meetings:   Marland Kitchen Marital Status:      Family History: The patient's family history includes Hyperlipidemia in his father; Hypertension in his father.  ROS:   Please see the history of present illness.    All other systems reviewed and are negative.  EKGs/Labs/Other Studies Reviewed:    The following studies were reviewed today: I discussed my findings with the patient at length.   Recent Labs: 09/29/2019: TSH 1.340 12/26/2019: ALT 29; BUN 13; Creatinine, Ser 1.24; Hemoglobin 12.5; Platelets 231; Potassium 3.8; Sodium 140  Recent Lipid Panel    Component Value Date/Time   CHOL 135 10/09/2019 0820   TRIG 159 (H) 10/09/2019 0820   HDL 47 10/09/2019 0820   CHOLHDL 2.9 10/09/2019 0820   LDLCALC 61 10/09/2019 0820    Physical Exam:    VS:  BP (!) 142/74   Pulse 87   Temp 98.2 F (36.8 C)   Ht 6\' 2"  (1.88 m)   Wt (!) 308 lb 6.4 oz (139.9 kg)   SpO2 95%   BMI 39.60 kg/m     Wt Readings from Last 3 Encounters:  02/23/20 (!) 308 lb 6.4 oz (139.9 kg)  01/22/20 (!) 309 lb 9.6 oz (140.4 kg)  12/26/19 (!) 319 lb 10.7 oz (145 kg)     GEN: Patient is in no acute distress HEENT: Normal NECK: No JVD; No carotid bruits LYMPHATICS: No lymphadenopathy CARDIAC: Hear sounds regular, 2/6 systolic murmur at the apex. RESPIRATORY:  Clear to auscultation without rales, wheezing or rhonchi  ABDOMEN: Soft, non-tender, non-distended MUSCULOSKELETAL:  No edema; No deformity  SKIN: Warm and dry NEUROLOGIC:  Alert and  oriented x 3 PSYCHIATRIC:  Normal affect   Signed, Jenean Lindau, MD  02/23/2020 2:30 PM  [facility]

## 2020-02-23 NOTE — Patient Instructions (Signed)
Medication Instructions:  No medication changes. *If you need a refill on your cardiac medications before your next appointment, please call your pharmacy*   Lab Work: None ordered If you have labs (blood work) drawn today and your tests are completely normal, you will receive your results only by: . MyChart Message (if you have MyChart) OR . A paper copy in the mail If you have any lab test that is abnormal or we need to change your treatment, we will call you to review the results.   Testing/Procedures: Your physician has requested that you have an echocardiogram. Echocardiography is a painless test that uses sound waves to create images of your heart. It provides your doctor with information about the size and shape of your heart and how well your heart's chambers and valves are working. This procedure takes approximately one hour. There are no restrictions for this procedure.     Follow-Up: At [facility], you and your health needs are our priority.  As part of our continuing mission to provide you with exceptional heart care, we have created designated Provider Care Teams.  These Care Teams include your primary Cardiologist (physician) and Advanced Practice Providers (APPs -  Physician Assistants and Nurse Practitioners) who all work together to provide you with the care you need, when you need it.  We recommend signing up for the patient portal called "MyChart".  Sign up information is provided on this After Visit Summary.  MyChart is used to connect with patients for Virtual Visits (Telemedicine).  Patients are able to view lab/test results, encounter notes, upcoming appointments, etc.  Non-urgent messages can be sent to your provider as well.   To learn more about what you can do with MyChart, go to [email or web address].    Your next appointment:   6 month(s)  The format for your next appointment:   In Person  Provider:   Rajan [name], MD   Other  Instructions  Echocardiogram An echocardiogram is a procedure that uses painless sound waves (ultrasound) to produce an image of the heart. Images from an echocardiogram can provide important information about:  Signs of coronary artery disease (CAD).  Aneurysm detection. An aneurysm is a weak or damaged part of an artery wall that bulges out from the normal force of blood pumping through the body.  Heart size and shape. Changes in the size or shape of the heart can be associated with certain conditions, including heart failure, aneurysm, and CAD.  Heart muscle function.  Heart valve function.  Signs of a past heart attack.  Fluid buildup around the heart.  Thickening of the heart muscle.  A tumor or infectious growth around the heart valves. Tell a health care provider about:  Any allergies you have.  All medicines you are taking, including vitamins, herbs, eye drops, creams, and over-the-counter medicines.  Any blood disorders you have.  Any surgeries you have had.  Any medical conditions you have.  Whether you are pregnant or may be pregnant. What are the risks? Generally, this is a safe procedure. However, problems may occur, including:  Allergic reaction to dye (contrast) that may be used during the procedure. What happens before the procedure? No specific preparation is needed. You may eat and drink normally. What happens during the procedure?   An IV tube may be inserted into one of your veins.  You may receive contrast through this tube. A contrast is an injection that improves the quality of the pictures from your heart.  A   gel will be applied to your chest.  A wand-like tool (transducer) will be moved over your chest. The gel will help to transmit the sound waves from the transducer.  The sound waves will harmlessly bounce off of your heart to allow the heart images to be captured in real-time motion. The images will be recorded on a computer. The  procedure may vary among health care providers and hospitals. What happens after the procedure?  You may return to your normal, everyday life, including diet, activities, and medicines, unless your health care provider tells you not to do that. Summary  An echocardiogram is a procedure that uses painless sound waves (ultrasound) to produce an image of the heart.  Images from an echocardiogram can provide important information about the size and shape of your heart, heart muscle function, heart valve function, and fluid buildup around your heart.  You do not need to do anything to prepare before this procedure. You may eat and drink normally.  After the echocardiogram is completed, you may return to your normal, everyday life, unless your health care provider tells you not to do that. This information is not intended to replace advice given to you by your health care provider. Make sure you discuss any questions you have with your health care provider. Document Revised: 02/05/2019 Document Reviewed: 11/17/2016 Elsevier Patient Education  2020 Elsevier Inc.   

## 2020-02-29 ENCOUNTER — Ambulatory Visit: Payer: Medicare Other | Admitting: Cardiology

## 2020-03-11 ENCOUNTER — Other Ambulatory Visit: Payer: Self-pay

## 2020-03-11 ENCOUNTER — Ambulatory Visit (INDEPENDENT_AMBULATORY_CARE_PROVIDER_SITE_OTHER): Payer: Medicare Other

## 2020-03-11 DIAGNOSIS — I480 Paroxysmal atrial fibrillation: Secondary | ICD-10-CM

## 2020-03-11 NOTE — Progress Notes (Signed)
Complete echocardiogram has been performed.  Jimmy Cyrah Mclamb RDCS, RVT 

## 2020-03-21 ENCOUNTER — Encounter: Payer: Self-pay | Admitting: Cardiology

## 2020-03-21 ENCOUNTER — Other Ambulatory Visit: Payer: Self-pay

## 2020-03-21 ENCOUNTER — Ambulatory Visit (INDEPENDENT_AMBULATORY_CARE_PROVIDER_SITE_OTHER): Payer: Medicare Other | Admitting: Cardiology

## 2020-03-21 VITALS — BP 137/72 | HR 59 | Ht 74.0 in | Wt 310.0 lb

## 2020-03-21 DIAGNOSIS — I4819 Other persistent atrial fibrillation: Secondary | ICD-10-CM

## 2020-03-21 NOTE — Progress Notes (Signed)
Electrophysiology Office Note   Date:  03/21/2020   ID:  Sean Ormes., DOB 01/13/1951, MRN [id number]  PCP:  Sean Johns, MD  Cardiologist:  [name] Primary Electrophysiologist:  Sean Hintze Meredith Leeds, MD    Chief Complaint: AF   History of Present Illness: Sean [name]. is a 69 y.o. male who is being seen today for the evaluation of AF at the request of Sean Johns, MD. Presenting today for electrophysiology evaluation.  He has a history of hypertension, hyperlipidemia, CHF, and atrial fibrillation.  He had an attempted cardioversion 11/05/2019 but unfortunately did not return to sinus rhythm.  He has been having symptoms of weakness and fatigue for approximately last 2 months.  The symptoms also have been associated with some shortness of breath.  He does not have PND or orthopnea.  He had an echocardiogram that showed an ejection fraction of 35 to 40%.  He is status post AF ablation 12/25/2019.  Repeat echo showed a normalization of ejection fraction post ablation.  Today, denies symptoms of palpitations, chest pain, shortness of breath, orthopnea, PND, lower extremity edema, claudication, dizziness, presyncope, syncope, bleeding, or neurologic sequela. The patient is tolerating medications without difficulties.  Since his ablation he has done well.  He is noted no further episodes of atrial fibrillation.  He is able do all his daily activities.  His level of fatigue has improved.  His ejection fraction is also normalized.   Past Medical History:  Diagnosis Date  . Allergic rhinitis   . Arrhythmia   . Atrial fibrillation ([place])   . Benign prostatic hyperplasia with urinary obstruction 11/29/2018  . CHF (congestive heart failure) ([place])   . Diabetes mellitus due to underlying condition with unspecified complications ([place]) [id number]  . Diabetes mellitus without complication ([place])   . DOE (dyspnea on exertion) 10/29/2019   S/p PE  11/2018 > DOAC recurrent 09/14/2019 off DOAC so  resumed  - onset variable doe and orthostatic lightheadedness 06/2019 with afib - PFT's [place]  10/08/2019 :  FEV1 2.79 (73%) with ratio 75 and 6 % better p saba,  Air trapping on lung vol,  ERV 14% and dlco 22.78 (60%) corrects to 3.68 (76%) for vol with minimal concavity to f/v loop  - 10/29/2019   Walked RA x two laps =  approx 523ft @ avg  . Essential hypertension 09/29/2019  . Fissure in skin of foot 12/11/2016  . GERD (gastroesophageal reflux disease) 11/29/2018  . Hemospermia 02/22/2020  . History of pulmonary embolism 09/29/2019  . Hyperlipidemia   . Hypertension   . Hypogonadism in male 02/22/2020  . IBS (irritable bowel syndrome) 11/29/2018  . Impotence 02/22/2020  . Incomplete emptying of bladder 02/22/2020  . Mixed dyslipidemia 09/29/2019  . Morbid obesity ([place]) 09/29/2019  . Overgrown toenails 12/11/2016  . Persistent atrial fibrillation ([place]) 09/29/2019  . Plantar fasciitis, bilateral 06/05/2016  . Secondary hypercoagulable state ([place]) 01/22/2020  . Upper airway cough syndrome 12/15/2019   Noted on exam 12/15/2019 onset while on Entresto, resolved with purse lip   Past Surgical History:  Procedure Laterality Date  . ATRIAL FIBRILLATION ABLATION N/A 12/25/2019   Procedure: ATRIAL FIBRILLATION ABLATION;  Surgeon: [name], MD;  Location: [facility];  Service: Cardiovascular;  Laterality: N/A;  . CARDIAC CATHETERIZATION    . KNEE SURGERY    . PILONIDAL CYST / SINUS EXCISION       Current Outpatient Medications  Medication Sig Dispense Refill  . albuterol (PROVENTIL) (2.5 MG/3ML) 0.083%  nebulizer solution Take 2.5 mg by nebulization daily as needed for wheezing or shortness of breath.     Marland Kitchen albuterol (VENTOLIN HFA) 108 (90 Base) MCG/ACT inhaler Inhale 2 puffs into the lungs as needed for wheezing or shortness of breath.     . allopurinol (ZYLOPRIM) 100 MG tablet Take 100 mg by mouth daily.    . Ascorbic Acid (VITAMIN C) 1000 MG tablet Take 1,000 mg by mouth 2 (two) times  daily.    Marland Kitchen atorvastatin (LIPITOR) 20 MG tablet Take 1 tablet (20 mg total) by mouth daily. 30 tablet 3  . B Complex-C (SUPER B COMPLEX PO) Take 1 capsule by mouth daily.     . Cholecalciferol (VITAMIN D3) 50 MCG (2000 UT) TABS Take 2,000 Units by mouth 2 (two) times daily.     Marland Kitchen ELIQUIS 5 MG TABS tablet Take 1 tablet (5 mg total) by mouth 2 (two) times daily. 180 tablet 1  . empagliflozin (JARDIANCE) 25 MG TABS tablet Take 25 mg by mouth daily.    . eszopiclone (LUNESTA) 2 MG TABS tablet Take 1 mg by mouth at bedtime.     . fluticasone (FLONASE) 50 MCG/ACT nasal spray Place 2 sprays into both nostrils daily as needed for allergies or rhinitis.    . furosemide (LASIX) 40 MG tablet Take 40 mg by mouth daily as needed for edema.    . hydrALAZINE (APRESOLINE) 50 MG tablet Take 50 mg by mouth every 8 (eight) hours.    . Insulin Glargine (TOUJEO SOLOSTAR [place]) Inject 90 Units into the skin at bedtime.    . metoprolol tartrate (LOPRESSOR) 25 MG tablet Take 25 mg by mouth 2 (two) times daily.    . montelukast (SINGULAIR) 10 MG tablet Take 10 mg by mouth at bedtime.    . Omega-3 Fatty Acids (FISH OIL) 1200 MG CAPS Take 1,200 mg by mouth 2 (two) times daily.     Marland Kitchen omeprazole (PRILOSEC) 40 MG capsule Take 40 mg by mouth 2 (two) times daily.     Marland Kitchen rOPINIRole (REQUIP) 2 MG tablet Take 2 mg by mouth at bedtime.    . sacubitril-valsartan (ENTRESTO) 49-51 MG Take 1 tablet by mouth 2 (two) times daily. 60 tablet 11  . tamsulosin (FLOMAX) 0.4 MG CAPS capsule Take 0.4 capsules by mouth 2 (two) times daily.    . traMADol (ULTRAM) 50 MG tablet Take 50 mg by mouth daily as needed.    . venlafaxine XR (EFFEXOR-XR) 150 MG 24 hr capsule Take 150 mg by mouth daily.     . Vitamin D, Ergocalciferol, (DRISDOL) 1.25 MG (50000 UT) CAPS capsule Take 50,000 Units by mouth every Tuesday.      No current facility-administered medications for this visit.    Allergies:   Prednisone and Victoza [liraglutide]   Social History:   The patient  reports that he has never smoked. He has never used smokeless tobacco. He reports previous alcohol use. He reports that he does not use drugs.   Family History:  The patient's family history includes Hyperlipidemia in his father; Hypertension in his father.    ROS:  Please see the history of present illness.   Otherwise, review of systems is positive for none.   All other systems are reviewed and negative.   PHYSICAL EXAM: VS:  BP 137/72   Pulse (!) 59   Ht 6\' 2"  (1.88 m)   Wt (!) 310 lb (140.6 kg)   SpO2 93%   BMI 39.80 kg/m  ,  BMI Body mass index is 39.8 kg/m. GEN: Well nourished, well developed, in no acute distress  HEENT: normal  Neck: no JVD, carotid bruits, or masses Cardiac: RRR; no murmurs, rubs, or gallops,no edema  Respiratory:  clear to auscultation bilaterally, normal work of breathing GI: soft, nontender, nondistended, + BS MS: no deformity or atrophy  Skin: warm and dry Neuro:  Strength and sensation are intact Psych: euthymic mood, full affect  EKG:  EKG is ordered today. Personal review of the ekg ordered shows sinus rhythm, rate 59, first-degree AV block  Recent Labs: 09/29/2019: TSH 1.340 12/26/2019: ALT 29; BUN 13; Creatinine, Ser 1.24; Hemoglobin 12.5; Platelets 231; Potassium 3.8; Sodium 140    Lipid Panel     Component Value Date/Time   CHOL 135 10/09/2019 0820   TRIG 159 (H) 10/09/2019 0820   HDL 47 10/09/2019 0820   CHOLHDL 2.9 10/09/2019 0820   LDLCALC 61 10/09/2019 0820     Wt Readings from Last 3 Encounters:  03/21/20 (!) 310 lb (140.6 kg)  02/23/20 (!) 308 lb 6.4 oz (139.9 kg)  01/22/20 (!) 309 lb 9.6 oz (140.4 kg)      Other studies Reviewed: Additional studies/ records that were reviewed today include: TTE 03/11/2020 Review of the above records today demonstrates:  1. Left ventricular ejection fraction, by estimation, is 60 to 65%. The  left ventricle has normal function. The left ventricle has no regional  wall  motion abnormalities. Left ventricular diastolic parameters are  consistent with Grade I diastolic  dysfunction (impaired relaxation).   ASSESSMENT AND PLAN:  1.  Persistent atrial fibrillation: Currently on amiodarone and Eliquis.  Is status post AF ablation 12/25/2019.  CHA2DS2-VASc of at least 3.  He remains in sinus rhythm since his ablation.  We Brantlee Hinde thus stop amiodarone today.  2.  Likely nonischemic cardiomyopathy: Ejection fraction 35 to 40%.  Currently on Entresto and metoprolol.  Fortunately his ejection fraction has normalized since his ablation.  No changes.  3.  Hypertension: Currently well controlled   Current medicines are reviewed at length with the patient today.   The patient does not have concerns regarding his medicines.  The following changes were made today: Stop amiodarone  Labs/ tests ordered today include:  Orders Placed This Encounter  Procedures  . EKG 12-Lead     Disposition:   FU with Halford Goetzke 3 months  Signed, Catrell Morrone Meredith Leeds, MD  03/21/2020 10:23 AM     [facility] [address] [place] [address] [phone] (office) [phone] (fax)

## 2020-03-21 NOTE — Patient Instructions (Signed)
Medication Instructions:  Your physician has recommended you make the following change in your medication:  1. STOP AMIODARONE   *If you need a refill on your cardiac medications before your next appointment, please call your pharmacy*   Lab Work: NONE If you have labs (blood work) drawn today and your tests are completely normal, you will receive your results only by: Marland Kitchen MyChart Message (if you have MyChart) OR . A paper copy in the mail If you have any lab test that is abnormal or we need to change your treatment, we will call you to review the results.   Testing/Procedures: NONE   Follow-Up: At [facility], you and your health needs are our priority.  As part of our continuing mission to provide you with exceptional heart care, we have created designated Provider Care Teams.  These Care Teams include your primary Cardiologist (physician) and Advanced Practice Providers (APPs -  Physician Assistants and Nurse Practitioners) who all work together to provide you with the care you need, when you need it.  We recommend signing up for the patient portal called "MyChart".  Sign up information is provided on this After Visit Summary.  MyChart is used to connect with patients for Virtual Visits (Telemedicine).  Patients are able to view lab/test results, encounter notes, upcoming appointments, etc.  Non-urgent messages can be sent to your provider as well.   To learn more about what you can do with MyChart, go to [email or web address].    Your next appointment:   3 month(s)  The format for your next appointment:   In Person  Provider:   DR. Curt Bears

## 2020-03-23 DIAGNOSIS — Z8679 Personal history of other diseases of the circulatory system: Secondary | ICD-10-CM | POA: Diagnosis not present

## 2020-03-23 DIAGNOSIS — M1990 Unspecified osteoarthritis, unspecified site: Secondary | ICD-10-CM | POA: Diagnosis not present

## 2020-03-23 DIAGNOSIS — G4700 Insomnia, unspecified: Secondary | ICD-10-CM | POA: Diagnosis not present

## 2020-03-23 DIAGNOSIS — E559 Vitamin D deficiency, unspecified: Secondary | ICD-10-CM | POA: Diagnosis not present

## 2020-03-24 DIAGNOSIS — D649 Anemia, unspecified: Secondary | ICD-10-CM | POA: Diagnosis not present

## 2020-03-24 DIAGNOSIS — E559 Vitamin D deficiency, unspecified: Secondary | ICD-10-CM | POA: Diagnosis not present

## 2020-03-24 DIAGNOSIS — E1140 Type 2 diabetes mellitus with diabetic neuropathy, unspecified: Secondary | ICD-10-CM | POA: Diagnosis not present

## 2020-03-24 DIAGNOSIS — Z79899 Other long term (current) drug therapy: Secondary | ICD-10-CM | POA: Diagnosis not present

## 2020-03-24 DIAGNOSIS — K921 Melena: Secondary | ICD-10-CM | POA: Diagnosis not present

## 2020-03-30 DIAGNOSIS — W19XXXA Unspecified fall, initial encounter: Secondary | ICD-10-CM | POA: Diagnosis not present

## 2020-03-30 DIAGNOSIS — E1121 Type 2 diabetes mellitus with diabetic nephropathy: Secondary | ICD-10-CM | POA: Diagnosis not present

## 2020-03-30 DIAGNOSIS — M25462 Effusion, left knee: Secondary | ICD-10-CM | POA: Diagnosis not present

## 2020-03-30 DIAGNOSIS — M25562 Pain in left knee: Secondary | ICD-10-CM | POA: Diagnosis not present

## 2020-03-30 DIAGNOSIS — M1712 Unilateral primary osteoarthritis, left knee: Secondary | ICD-10-CM | POA: Diagnosis not present

## 2020-03-30 DIAGNOSIS — S80219A Abrasion, unspecified knee, initial encounter: Secondary | ICD-10-CM | POA: Diagnosis not present

## 2020-04-06 DIAGNOSIS — L720 Epidermal cyst: Secondary | ICD-10-CM | POA: Diagnosis not present

## 2020-04-13 ENCOUNTER — Other Ambulatory Visit: Payer: Self-pay

## 2020-04-13 MED ORDER — ATORVASTATIN CALCIUM 20 MG PO TABS: 20.0000 mg | ORAL_TABLET | Freq: Every day | ORAL | 3 refills | Status: DC

## 2020-04-13 NOTE — Progress Notes (Signed)
Received refill request via fax from [facility].  Sent refill for patient.   Atorvastatin 20 MG Daily

## 2020-06-02 ENCOUNTER — Encounter: Payer: Self-pay | Admitting: Gastroenterology

## 2020-06-03 IMAGING — DX DG CHEST 2V
2 series · 2 of 2 positions shown · non-contrast
Comparison: Single-view of the chest and CT chest 09/14/2019.

CLINICAL DATA: Dyspnea on exertion.

EXAM:
CHEST - 2 VIEW

[chest pa]
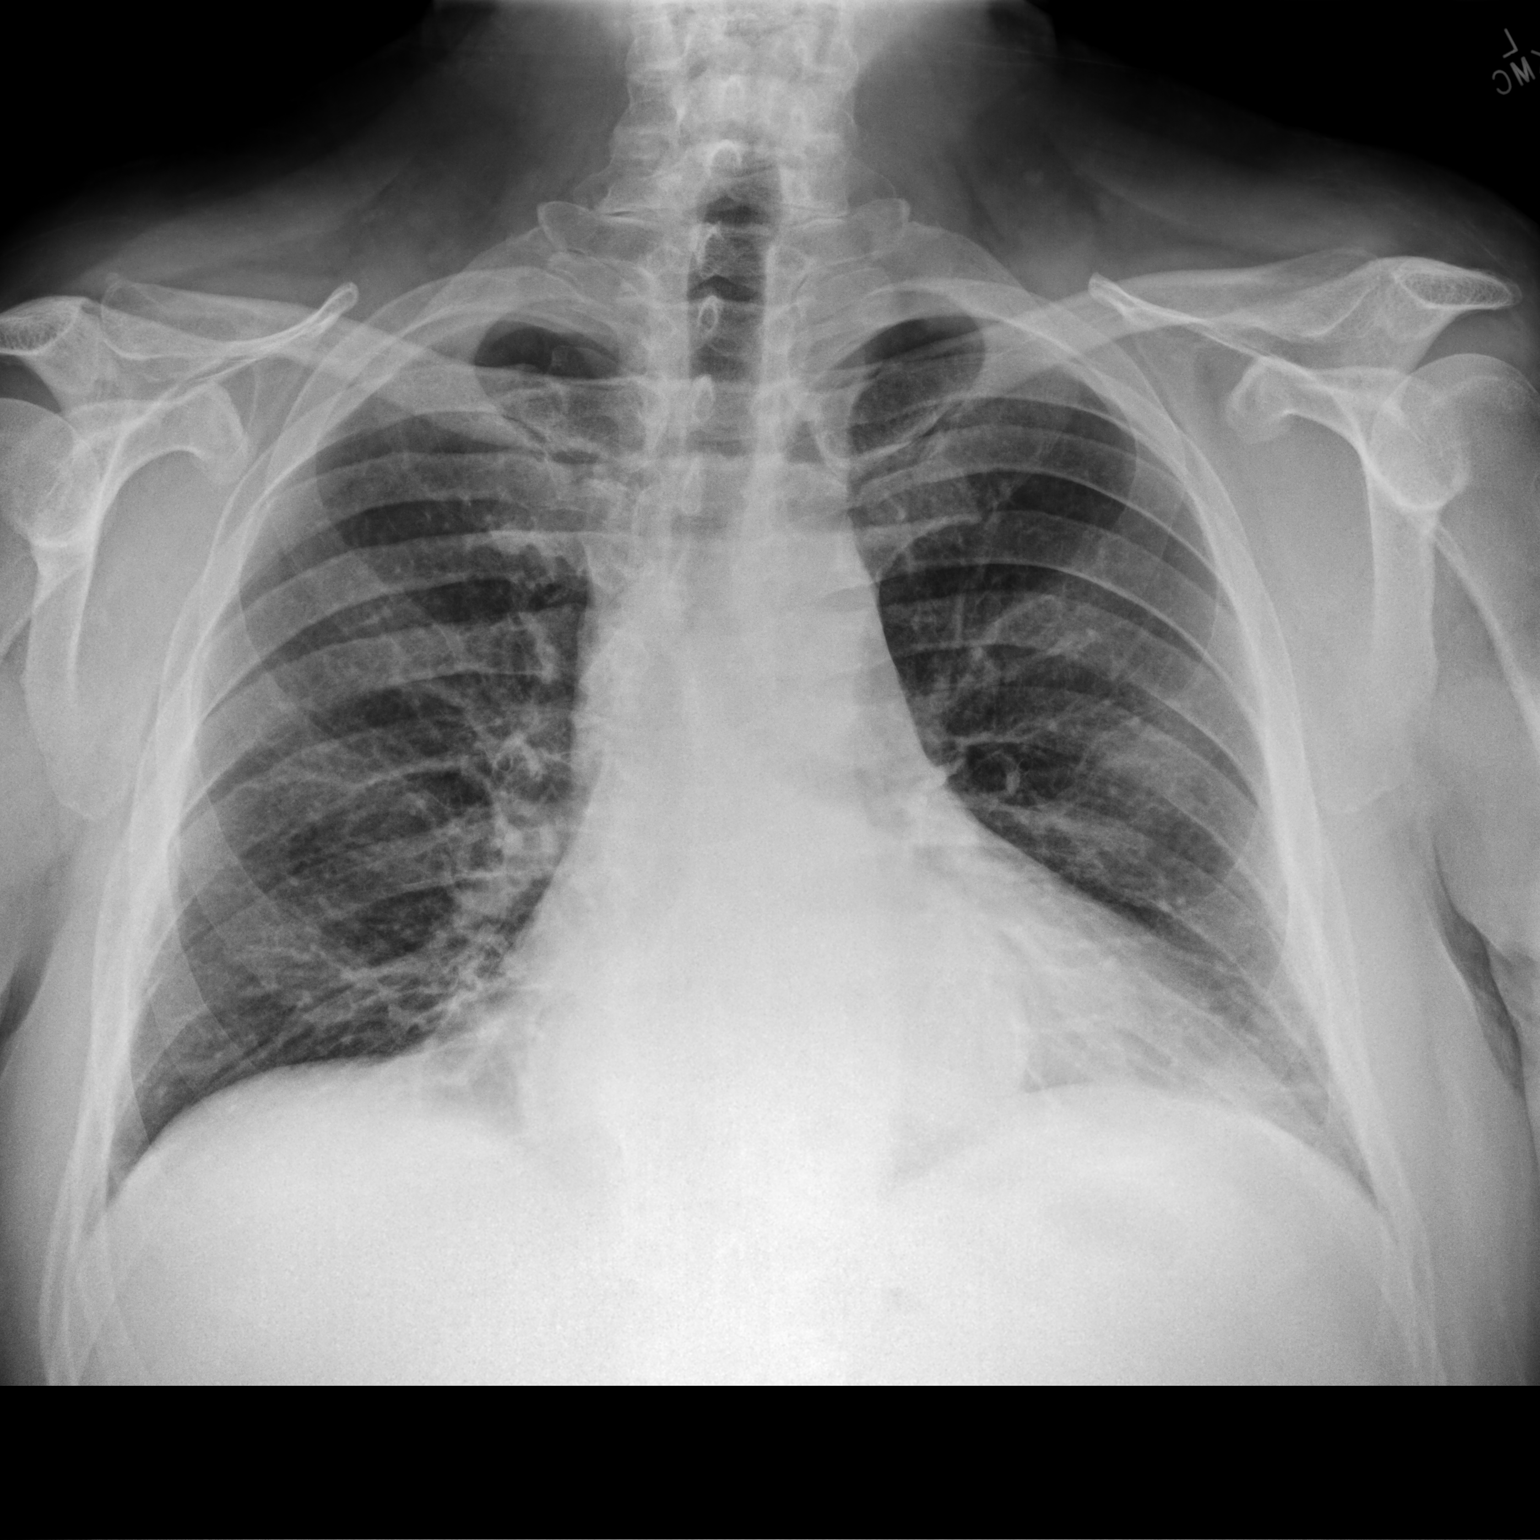

[chest lat]
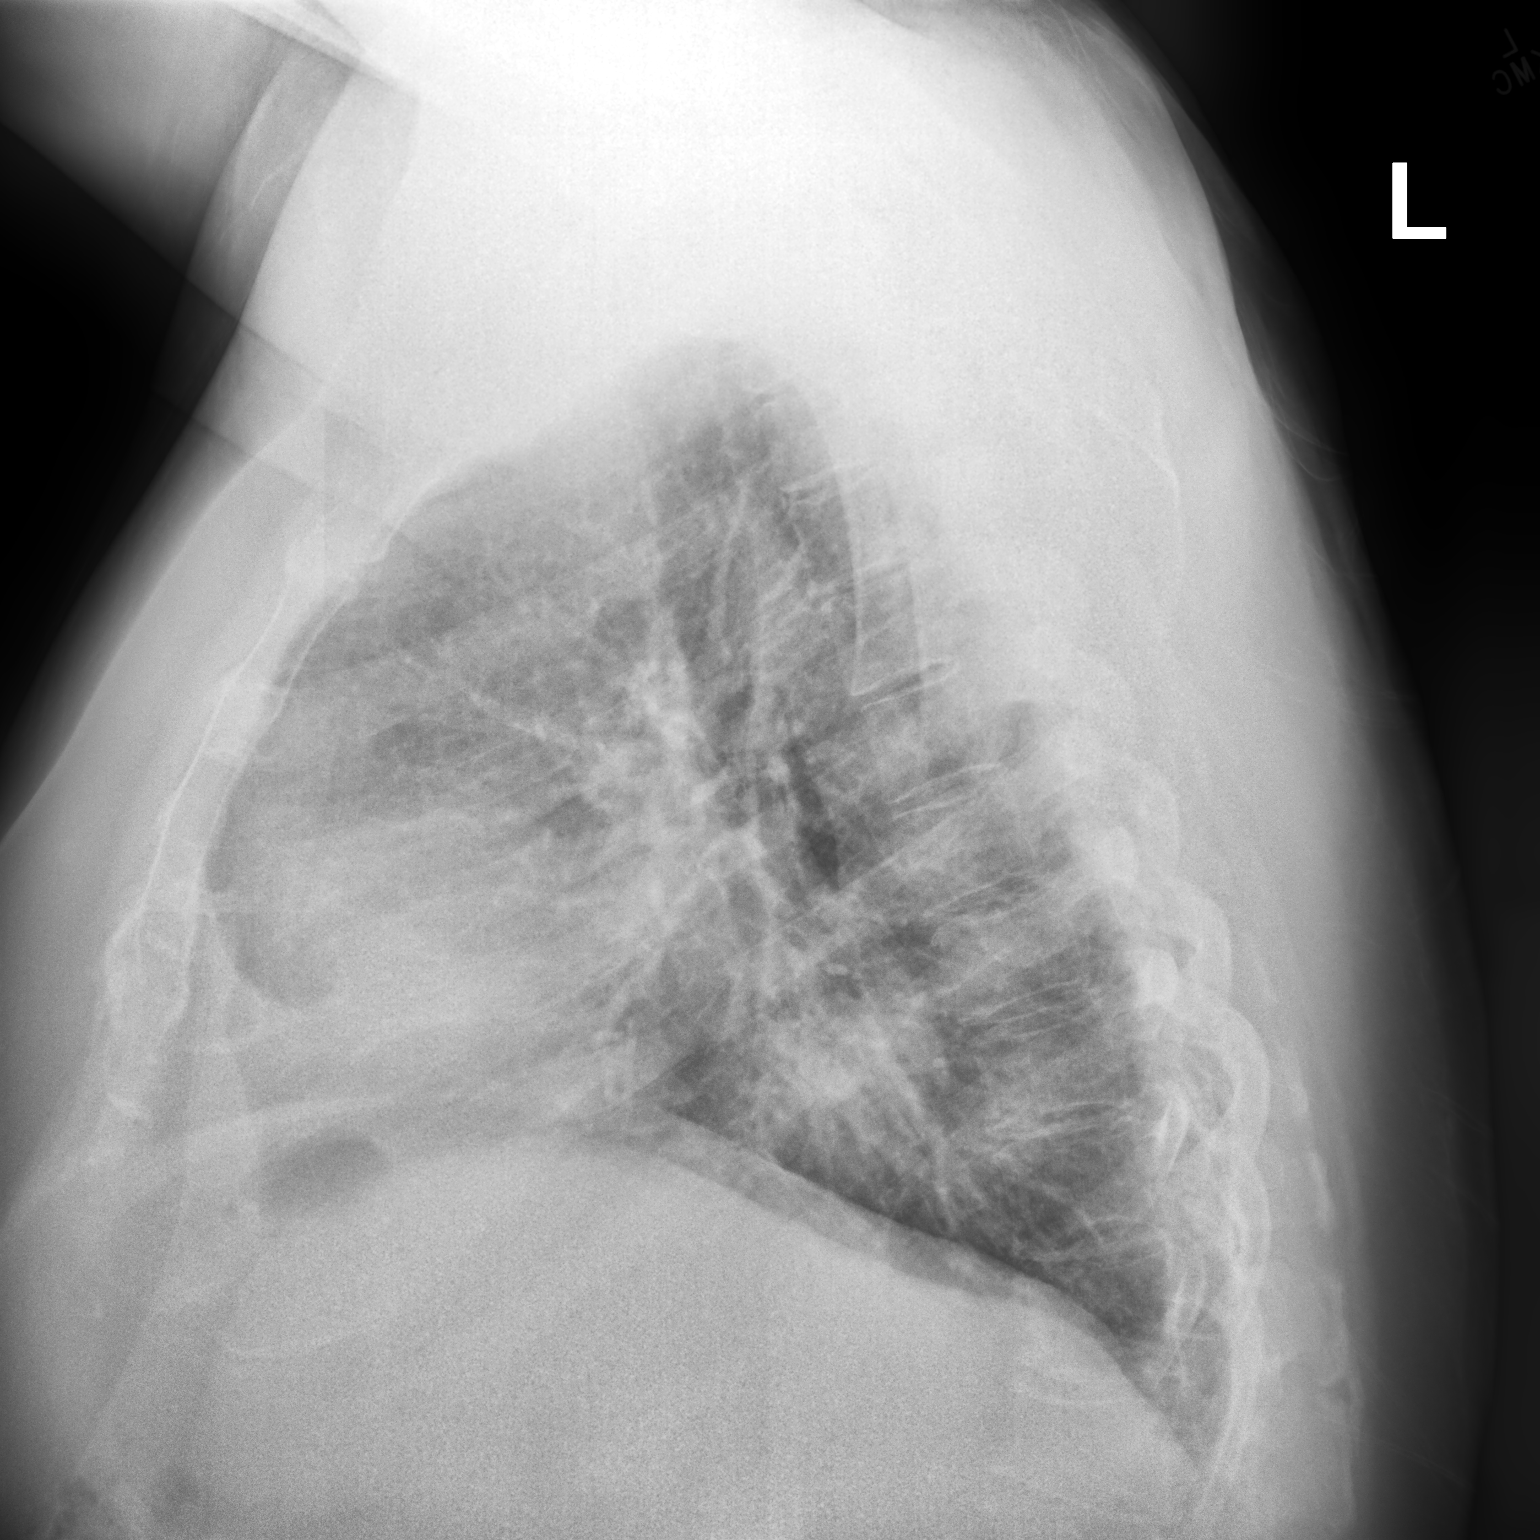

[2 of 2 positions shown; findings below may reference images not displayed]

FINDINGS: The lungs are clear. Heart size is upper normal. No pneumothorax or
pleural effusion. Hiatal hernia noted. No acute or focal bony
abnormality.
IMPRESSION: No acute disease.

Hiatal hernia.

## 2020-06-09 ENCOUNTER — Other Ambulatory Visit: Payer: Self-pay | Admitting: Cardiology

## 2020-06-22 DIAGNOSIS — M7989 Other specified soft tissue disorders: Secondary | ICD-10-CM | POA: Diagnosis not present

## 2020-06-22 DIAGNOSIS — I10 Essential (primary) hypertension: Secondary | ICD-10-CM | POA: Diagnosis not present

## 2020-06-22 DIAGNOSIS — Z79899 Other long term (current) drug therapy: Secondary | ICD-10-CM | POA: Diagnosis not present

## 2020-06-22 DIAGNOSIS — E559 Vitamin D deficiency, unspecified: Secondary | ICD-10-CM | POA: Diagnosis not present

## 2020-06-22 DIAGNOSIS — J309 Allergic rhinitis, unspecified: Secondary | ICD-10-CM | POA: Diagnosis not present

## 2020-06-22 DIAGNOSIS — Z8679 Personal history of other diseases of the circulatory system: Secondary | ICD-10-CM | POA: Diagnosis not present

## 2020-06-22 DIAGNOSIS — E1140 Type 2 diabetes mellitus with diabetic neuropathy, unspecified: Secondary | ICD-10-CM | POA: Diagnosis not present

## 2020-06-27 ENCOUNTER — Other Ambulatory Visit: Payer: Self-pay

## 2020-06-27 ENCOUNTER — Ambulatory Visit (INDEPENDENT_AMBULATORY_CARE_PROVIDER_SITE_OTHER): Payer: Medicare Other | Admitting: Cardiology

## 2020-06-27 ENCOUNTER — Encounter: Payer: Self-pay | Admitting: Cardiology

## 2020-06-27 VITALS — BP 100/60 | HR 73 | Ht 74.0 in | Wt 308.0 lb

## 2020-06-27 DIAGNOSIS — I4819 Other persistent atrial fibrillation: Secondary | ICD-10-CM

## 2020-06-27 MED ORDER — METOPROLOL SUCCINATE ER 50 MG PO TB24
50.0000 mg | ORAL_TABLET | Freq: Every day | ORAL | 1 refills | Status: DC
Start: 2020-06-27 — End: 2020-12-07

## 2020-06-27 NOTE — Progress Notes (Signed)
Electrophysiology Office Note   Date:  06/27/2020   ID:  Sean Breeding., DOB December 07, 1950, MRN [id number]  PCP:  Sean Johns, Sean [name]  Cardiologist:  Sean Primary Electrophysiologist:  Sean Groesbeck Meredith Leeds, Sean [name]    Chief Complaint: AF   History of Present Illness: Sean Lazaro. is a 69 y.o. male who is being seen today for the evaluation of AF at the request of Sean Johns, Sean [name]. Presenting today for electrophysiology evaluation.  He has a history of hypertension, hyperlipidemia, CHF, and atrial fibrillation.  He had an attempted cardioversion 11/05/2019 but unfortunately did not return to sinus rhythm.  He has been having symptoms of weakness and fatigue for approximately last 2 months.  The symptoms also have been associated with some shortness of breath.  He does not have PND or orthopnea.  He had an echocardiogram that showed an ejection fraction of 35 to 40%.  He is status post AF ablation 12/25/2019.  Repeat echo showed a normalization of ejection fraction post ablation.  Today, denies symptoms of palpitations, chest pain, shortness of breath, orthopnea, PND, lower extremity edema, claudication, dizziness, presyncope, syncope, bleeding, or neurologic sequela. The patient is tolerating medications without difficulties.  Unfortunately he has been feeling quite fatigued over the past few weeks.  He is unclear as to the cause.  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  He was initially thinking that this was due to atrial fibrillation but he had an ECG done that showed sinus rhythm.  Aside from that, he is able to do all of his daily activities without restriction.   Past Medical History:  Diagnosis Date  . Allergic rhinitis   . Arrhythmia   . Atrial fibrillation ([place])   . Benign prostatic hyperplasia with urinary obstruction 11/29/2018  . CHF (congestive heart failure) ([place])   . Diabetes mellitus due to underlying condition with unspecified  complications ([place]) 70/12/5007  . Diabetes mellitus without complication ([place])   . DOE (dyspnea on exertion) 10/29/2019   S/p PE  11/2018 > DOAC recurrent 09/14/2019 off DOAC so resumed  - onset variable doe and orthostatic lightheadedness 06/2019 with afib - PFT's [place]  10/08/2019 :  FEV1 2.79 (73%) with ratio 75 and 6 % better p saba,  Air trapping on lung vol,  ERV 14% and dlco 22.78 (60%) corrects to 3.68 (76%) for vol with minimal concavity to f/v loop  - 10/29/2019   Walked RA x two laps =  approx 541ft @ avg  . Essential hypertension 09/29/2019  . Fissure in skin of foot 12/11/2016  . GERD (gastroesophageal reflux disease) 11/29/2018  . Hemospermia 02/22/2020  . History of pulmonary embolism 09/29/2019  . Hyperlipidemia   . Hypertension   . Hypogonadism in male 02/22/2020  . IBS (irritable bowel syndrome) 11/29/2018  . Impotence 02/22/2020  . Incomplete emptying of bladder 02/22/2020  . Mixed dyslipidemia 09/29/2019  . Morbid obesity ([place]) 09/29/2019  . Overgrown toenails 12/11/2016  . Persistent atrial fibrillation ([place]) 09/29/2019  . Plantar fasciitis, bilateral 06/05/2016  . Secondary hypercoagulable state ([place]) 01/22/2020  . Upper airway cough syndrome 12/15/2019   Noted on exam 12/15/2019 onset while on Entresto, resolved with purse lip   Past Surgical History:  Procedure Laterality Date  . ATRIAL FIBRILLATION ABLATION N/A 12/25/2019   Procedure: ATRIAL FIBRILLATION ABLATION;  Surgeon: [name], Sean [name];  Location: [facility];  Service: Cardiovascular;  Laterality: N/A;  . CARDIAC  CATHETERIZATION    . KNEE SURGERY    . PILONIDAL CYST / SINUS EXCISION       Current Outpatient Medications  Medication Sig Dispense Refill  . albuterol (PROVENTIL) (2.5 MG/3ML) 0.083% nebulizer solution Take 2.5 mg by nebulization daily as needed for wheezing or shortness of breath.     Marland Kitchen albuterol (VENTOLIN HFA) 108 (90 Base) MCG/ACT inhaler Inhale 2 puffs into the lungs as needed for wheezing  or shortness of breath.     . allopurinol (ZYLOPRIM) 100 MG tablet Take 100 mg by mouth daily.    . Ascorbic Acid (VITAMIN C) 1000 MG tablet Take 1,000 mg by mouth 2 (two) times daily.    Marland Kitchen atorvastatin (LIPITOR) 20 MG tablet Take 1 tablet (20 mg total) by mouth daily. 90 tablet 3  . B Complex-C (SUPER B COMPLEX PO) Take 1 capsule by mouth daily.     . Cholecalciferol (VITAMIN D3) 50 MCG (2000 UT) TABS Take 2,000 Units by mouth 2 (two) times daily.     Marland Kitchen ELIQUIS 5 MG TABS tablet Take 1 tablet (5 mg total) by mouth 2 (two) times daily. 180 tablet 1  . empagliflozin (JARDIANCE) 25 MG TABS tablet Take 25 mg by mouth daily.    . eszopiclone (LUNESTA) 2 MG TABS tablet Take 1 mg by mouth at bedtime.     . fluticasone (FLONASE) 50 MCG/ACT nasal spray Place 2 sprays into both nostrils daily as needed for allergies or rhinitis.    . furosemide (LASIX) 40 MG tablet Take 40 mg by mouth daily as needed for edema.    . hydrALAZINE (APRESOLINE) 50 MG tablet Take 50 mg by mouth every 8 (eight) hours.    . Insulin Glargine (TOUJEO SOLOSTAR [place]) Inject 90 Units into the skin at bedtime.    . montelukast (SINGULAIR) 10 MG tablet Take 10 mg by mouth at bedtime.    . Omega-3 Fatty Acids (FISH OIL) 1200 MG CAPS Take 1,200 mg by mouth 2 (two) times daily.     Marland Kitchen omeprazole (PRILOSEC) 40 MG capsule Take 40 mg by mouth 2 (two) times daily.     Marland Kitchen rOPINIRole (REQUIP) 2 MG tablet Take 2 mg by mouth at bedtime.    . sacubitril-valsartan (ENTRESTO) 49-51 MG Take 1 tablet by mouth 2 (two) times daily. 60 tablet 11  . tamsulosin (FLOMAX) 0.4 MG CAPS capsule Take 0.4 capsules by mouth 2 (two) times daily.    . traMADol (ULTRAM) 50 MG tablet Take 50 mg by mouth daily as needed.    . venlafaxine XR (EFFEXOR-XR) 150 MG 24 hr capsule Take 150 mg by mouth daily.     . Vitamin D, Ergocalciferol, (DRISDOL) 1.25 MG (50000 UT) CAPS capsule Take 50,000 Units by mouth every Tuesday.     . metoprolol succinate (TOPROL-XL) 50 MG 24 hr  tablet Take 1 tablet (50 mg total) by mouth daily. Take with or immediately following a meal. 90 tablet 1   No current facility-administered medications for this visit.    Allergies:   Prednisone and Victoza [liraglutide]   Social History:  The patient  reports that he has never smoked. He has never used smokeless tobacco. He reports previous alcohol use. He reports that he does not use drugs.   Family History:  The patient's family history includes Hyperlipidemia in his father; Hypertension in his father.    ROS:  Please see the history of present illness.   Otherwise, review of systems is positive for none.  All other systems are reviewed and negative.   PHYSICAL EXAM: VS:  BP 100/60   Pulse 73   Ht 6\' 2"  (3.43 m)   Wt (!) 308 lb (139.7 kg)   SpO2 94%   BMI 39.54 kg/m  , BMI Body mass index is 39.54 kg/m. GEN: Well nourished, well developed, in no acute distress  HEENT: normal  Neck: no JVD, carotid bruits, or masses Cardiac: RRR; no murmurs, rubs, or gallops,no edema  Respiratory:  clear to auscultation bilaterally, normal work of breathing GI: soft, nontender, nondistended, + BS MS: no deformity or atrophy  Skin: warm and dry Neuro:  Strength and sensation are intact Psych: euthymic mood, full affect  EKG:  EKG is not ordered today. Personal review of the ekg ordered 06/22/20 shows sinus rhythm, first-degree AV block, right inferior axis, possibly lead reversal  Recent Labs: 09/29/2019: TSH 1.340 12/26/2019: ALT 29; BUN 13; Creatinine, Ser 1.24; Hemoglobin 12.5; Platelets 231; Potassium 3.8; Sodium 140    Lipid Panel     Component Value Date/Time   CHOL 135 10/09/2019 0820   TRIG 159 (H) 10/09/2019 0820   HDL 47 10/09/2019 0820   CHOLHDL 2.9 10/09/2019 0820   LDLCALC 61 10/09/2019 0820     Wt Readings from Last 3 Encounters:  06/27/20 (!) 308 lb (139.7 kg)  03/21/20 (!) 310 lb (140.6 kg)  02/23/20 (!) 308 lb 6.4 oz (139.9 kg)      Other studies  Reviewed: Additional studies/ records that were reviewed today include: TTE 03/11/2020 Review of the above records today demonstrates:  1. Left ventricular ejection fraction, by estimation, is 60 to 65%. The  left ventricle has normal function. The left ventricle has no regional  wall motion abnormalities. Left ventricular diastolic parameters are  consistent with Grade I diastolic  dysfunction (impaired relaxation).   ASSESSMENT AND PLAN:  1.  Persistent atrial fibrillation: Currently on Eliquis.  Status post ablation 12/25/2019.  CHA2DS2-VASc of 3.  Amiodarone was stopped at the last visit.  He remains in sinus rhythm.  No changes.  2.  nonischemic cardiomyopathy: Ejection fraction 35 to 40%.  Currently on Entresto and metoprolol.  His ejection fraction is normalized since ablation.  He is having some fatigue.  We Temitope Griffing thus stop his metoprolol and start him on 50 mg of Toprol-XL in the evenings.  3.  Hypertension: Currently well controlled   Current medicines are reviewed at length with the patient today.   The patient does not have concerns regarding his medicines.  The following changes were made today: Stop metoprolol, start Toprol-XL  Labs/ tests ordered today include:  No orders of the defined types were placed in this encounter.    Disposition:   FU with [place] Nunley 6 months  Signed, Loren Vicens Meredith Leeds, Sean [name]  06/27/2020 10:08 AM     [facility] [address] [place] [address] [phone] (office) [phone] (fax)

## 2020-06-27 NOTE — Patient Instructions (Signed)
Medication Instructions:  Your physician has recommended you make the following change in your medication:  1. STOP Metoprolol Tartrate (Lopressor) 2. START Metoprolol Succinate (Toprol) 50 mg once daily  *If you need a refill on your cardiac medications before your next appointment, please call your pharmacy*   Lab Work: None ordered   Testing/Procedures: None ordered   Follow-Up: At [facility], you and your health needs are our priority.  As part of our continuing mission to provide you with exceptional heart care, we have created designated Provider Care Teams.  These Care Teams include your primary Cardiologist (physician) and Advanced Practice Providers (APPs -  Physician Assistants and Nurse Practitioners) who all work together to provide you with the care you need, when you need it.  We recommend signing up for the patient portal called "MyChart".  Sign up information is provided on this After Visit Summary.  MyChart is used to connect with patients for Virtual Visits (Telemedicine).  Patients are able to view lab/test results, encounter notes, upcoming appointments, etc.  Non-urgent messages can be sent to your provider as well.   To learn more about what you can do with MyChart, go to [email or web address].    Your next appointment:   6 month(s)  The format for your next appointment:   In Person  Provider:   Allegra Lai, MD   Thank you for choosing [facility]!!   Trinidad Curet, RN [phone]    Other Instructions  Metoprolol Extended-Release Tablets What is this medicine? METOPROLOL (me TOE proe lole) is a beta blocker. It decreases the amount of work your heart has to do and helps your heart beat regularly. It treats high blood pressure and/or prevent chest pain (also called angina). It also treats heart failure. This medicine may be used for other purposes; ask your health care provider or pharmacist if you have questions. COMMON BRAND NAME(S):  toprol, Toprol XL What should I tell my health care provider before I take this medicine? They need to know if you have any of these conditions:  diabetes  heart or vessel disease like slow heart rate, worsening heart failure, heart block, sick sinus syndrome or Raynaud's disease  kidney disease  liver disease  lung or breathing disease, like asthma or emphysema  pheochromocytoma  thyroid disease  an unusual or allergic reaction to metoprolol, other beta-blockers, medicines, foods, dyes, or preservatives  pregnant or trying to get pregnant  breast-feeding How should I use this medicine? Take this drug by mouth. Take it as directed on the prescription label at the same time every day. Take it with food. You may cut the tablet in half if it is scored (has a line in the middle of it). This may help you swallow the tablet if the whole tablet is too big. Be sure to take both halves. Do not take just one-half of the tablet. Keep taking it unless your health care provider tells you to stop. Talk to your health care provider about the use of this drug in children. While it may be prescribed for children as young as 6 for selected conditions, precautions do apply. Overdosage: If you think you have taken too much of this medicine contact a poison control center or emergency room at once. NOTE: This medicine is only for you. Do not share this medicine with others. What if I miss a dose? If you miss a dose, take it as soon as you can. If it is almost time for your next  dose, take only that dose. Do not take double or extra doses. What may interact with this medicine? This medicine may interact with the following medications:  certain medicines for blood pressure, heart disease, irregular heart beat  certain medicines for depression, like monoamine oxidase (MAO) inhibitors, fluoxetine, or paroxetine  clonidine  dobutamine  epinephrine  isoproterenol  reserpine This list may not  describe all possible interactions. Give your health care provider a list of all the medicines, herbs, non-prescription drugs, or dietary supplements you use. Also tell them if you smoke, drink alcohol, or use illegal drugs. Some items may interact with your medicine. What should I watch for while using this medicine? Visit your doctor or health care professional for regular check ups. Contact your doctor right away if your symptoms worsen. Check your blood pressure and pulse rate regularly. Ask your health care professional what your blood pressure and pulse rate should be, and when you should contact them. You may get drowsy or dizzy. Do not drive, use machinery, or do anything that needs mental alertness until you know how this medicine affects you. Do not sit or stand up quickly, especially if you are an older patient. This reduces the risk of dizzy or fainting spells. Contact your doctor if these symptoms continue. Alcohol may interfere with the effect of this medicine. Avoid alcoholic drinks. This medicine may increase blood sugar. Ask your healthcare provider if changes in diet or medicines are needed if you have diabetes. What side effects may I notice from receiving this medicine? Side effects that you should report to your doctor or health care professional as soon as possible:  allergic reactions like skin rash, itching or hives  cold or numb hands or feet  depression  difficulty breathing  faint  fever with sore throat  irregular heartbeat, chest pain  rapid weight gain   signs and symptoms of high blood sugar such as being more thirsty or hungry or having to urinate more than normal. You may also feel very tired or have blurry vision.  swollen legs or ankles Side effects that usually do not require medical attention (report to your doctor or health care professional if they continue or are bothersome):  anxiety or nervousness  change in sex drive or performance  dry  skin  headache  nightmares or trouble sleeping  short term memory loss  stomach upset or diarrhea This list may not describe all possible side effects. Call your doctor for medical advice about side effects. You may report side effects to FDA at 1-800-FDA-1088. Where should I keep my medicine? Keep out of the reach of children and pets. Store at room temperature between 20 and 25 degrees C (68 and 77 degrees F). Throw away any unused drug after the expiration date. NOTE: This sheet is a summary. It may not cover all possible information. If you have questions about this medicine, talk to your doctor, pharmacist, or health care provider.  2020 Elsevier/Gold Standard (2019-05-28 18:23:00)

## 2020-07-05 DIAGNOSIS — K760 Fatty (change of) liver, not elsewhere classified: Secondary | ICD-10-CM | POA: Diagnosis not present

## 2020-07-05 DIAGNOSIS — Z86718 Personal history of other venous thrombosis and embolism: Secondary | ICD-10-CM | POA: Diagnosis not present

## 2020-07-05 DIAGNOSIS — E1140 Type 2 diabetes mellitus with diabetic neuropathy, unspecified: Secondary | ICD-10-CM | POA: Diagnosis not present

## 2020-07-05 DIAGNOSIS — G2581 Restless legs syndrome: Secondary | ICD-10-CM | POA: Diagnosis not present

## 2020-07-05 DIAGNOSIS — Z86711 Personal history of pulmonary embolism: Secondary | ICD-10-CM | POA: Diagnosis not present

## 2020-07-05 DIAGNOSIS — G4700 Insomnia, unspecified: Secondary | ICD-10-CM | POA: Diagnosis not present

## 2020-07-05 DIAGNOSIS — K219 Gastro-esophageal reflux disease without esophagitis: Secondary | ICD-10-CM | POA: Diagnosis not present

## 2020-07-05 DIAGNOSIS — M1990 Unspecified osteoarthritis, unspecified site: Secondary | ICD-10-CM | POA: Diagnosis not present

## 2020-07-08 ENCOUNTER — Other Ambulatory Visit: Payer: Self-pay | Admitting: Cardiology

## 2020-07-08 NOTE — Telephone Encounter (Signed)
A.Fib Last OV 05-2020 Male 69yo 139.7kg Scr = 1.24 on 11/2019

## 2020-07-26 IMAGING — CT CT HEART MORPH/PULM VEIN W/ CM & W/O CA SCORE
1 of 8 series · 11 of 20 positions shown, 14 images · non-contrast
Comparison: None.
COMPARISON: None.

Addendum:
EXAM:
OVER-READ INTERPRETATION  CT CHEST

The following report is an over-read performed by radiologist Dr.
Delina Toma [REDACTED] on 12/21/2019. This
over-read does not include interpretation of cardiac or coronary
anatomy or pathology. The coronary calcium score/coronary CTA
interpretation by the cardiologist is attached.
CLINICAL DATA: Atrial fibrillation
Cardiac CTA
MEDICATIONS:
Sub lingual nitro. 4mg x 2
TECHNIQUE: The patient was scanned on a Siemens [REDACTED]ice scanner. Gantry
rotation speed was 250 msecs. Collimation was 0.6 mm. A 100 kV
prospective scan was triggered in the ascending thoracic aorta at
35-75% of the R-R interval. Average HR during the scan was 60 bpm.
The 3D data set was interpreted on a dedicated work station using
MPR, MIP and VRT modes. A total of 80cc of contrast was used.

[Series 18: pvm 35 - 65 % · axial · 0.49mm/px · z∈[-266,-123]mm · 11 of 3017 slices shown, 14 images]
[im 252/3017  vessel]
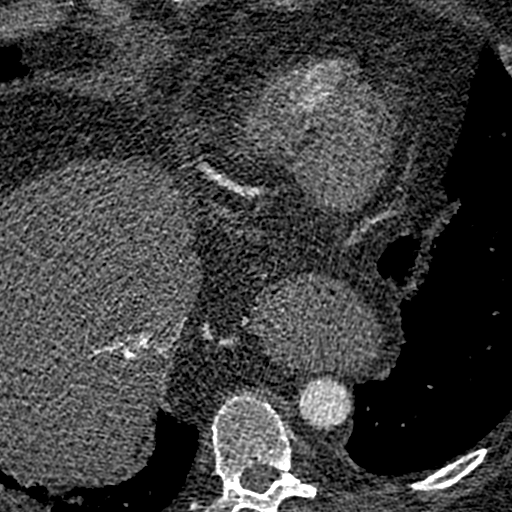
[im 252/3017  lung]
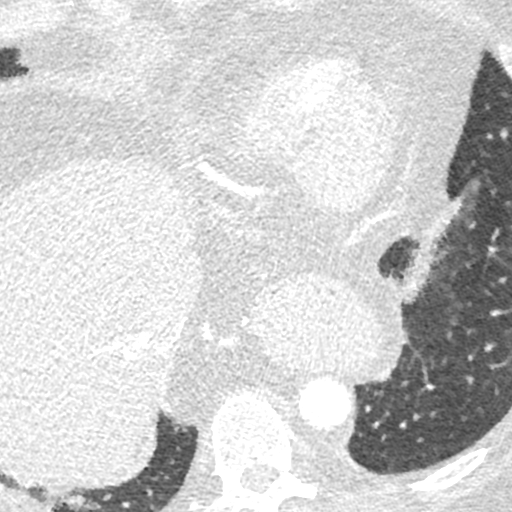
[im 503/3017  vessel]
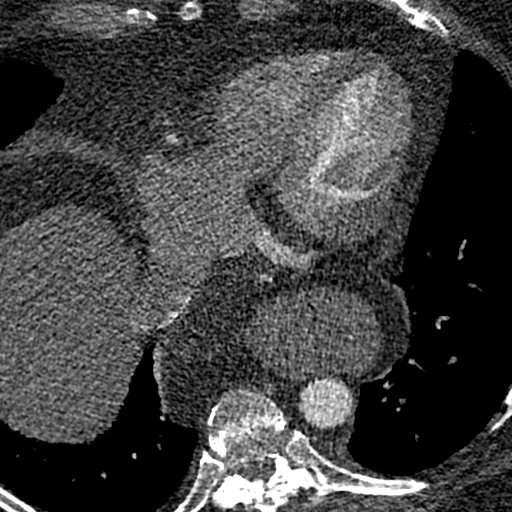
[im 755/3017  vessel]
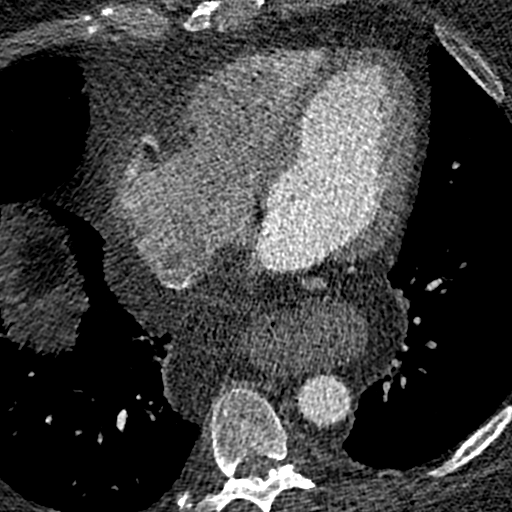
[im 1006/3017  vessel]
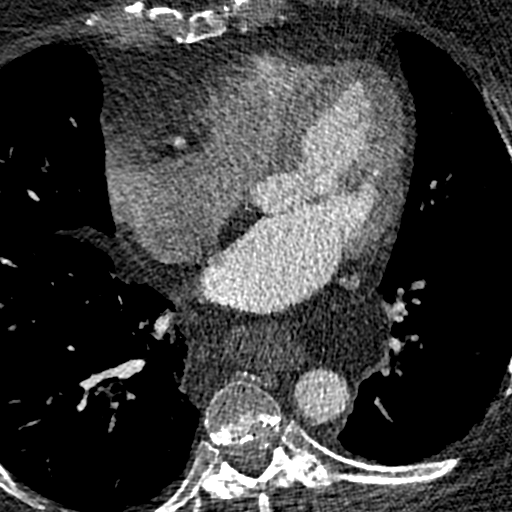
[im 1257/3017  vessel]
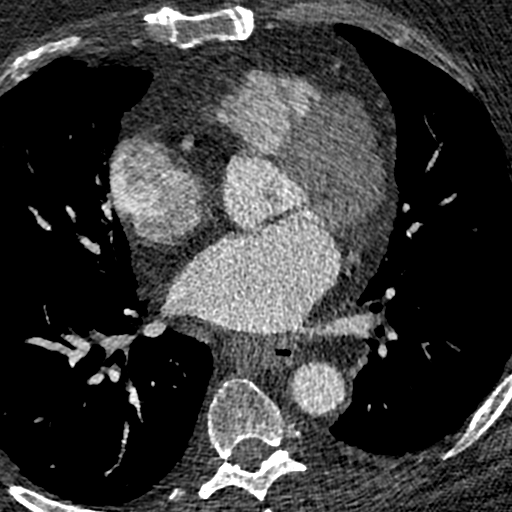
[im 1257/3017  lung]
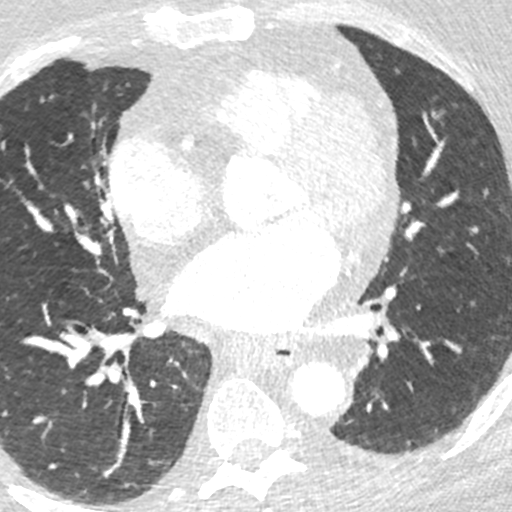
[im 1509/3017  vessel]
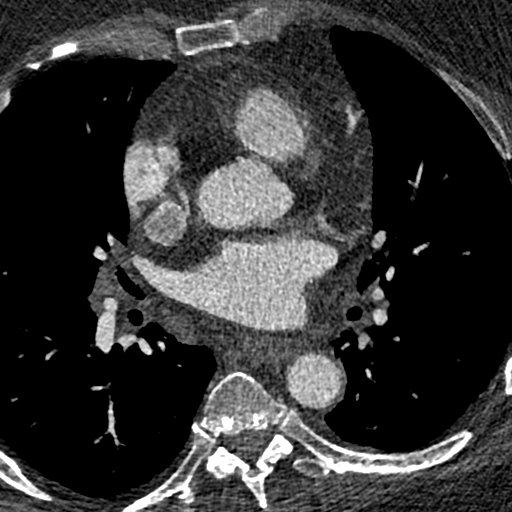
[im 1760/3017  vessel]
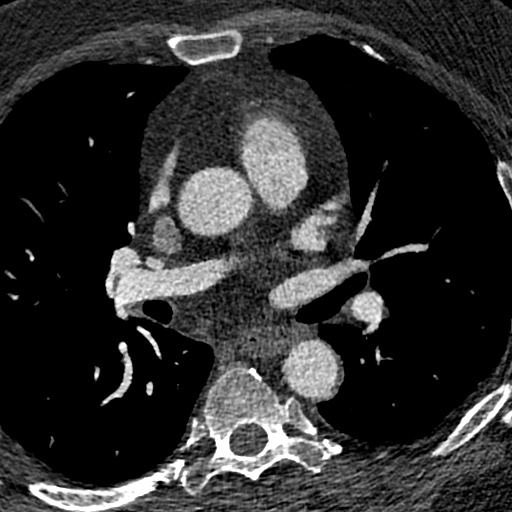
[im 2011/3017  vessel]
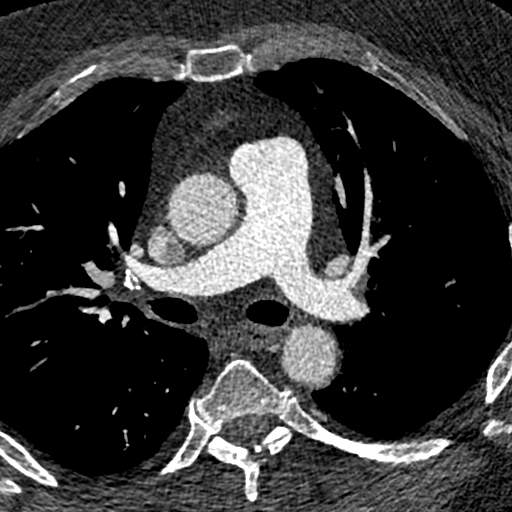
[im 2263/3017  vessel]
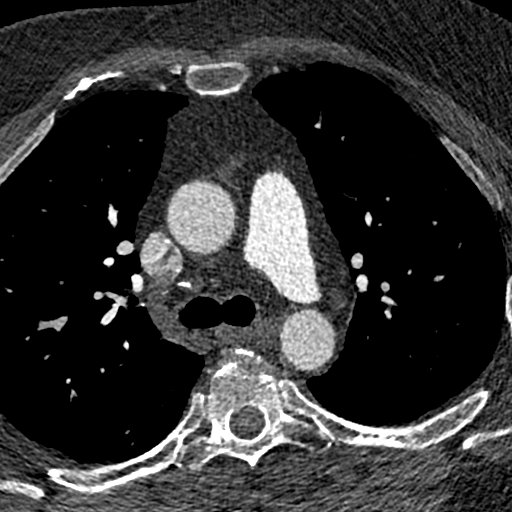
[im 2263/3017  lung]
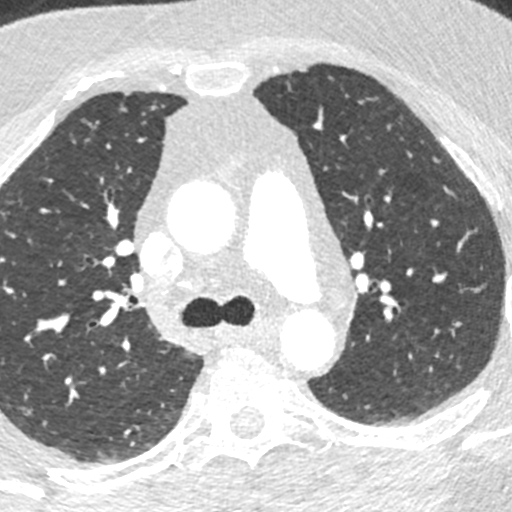
[im 2514/3017  vessel]
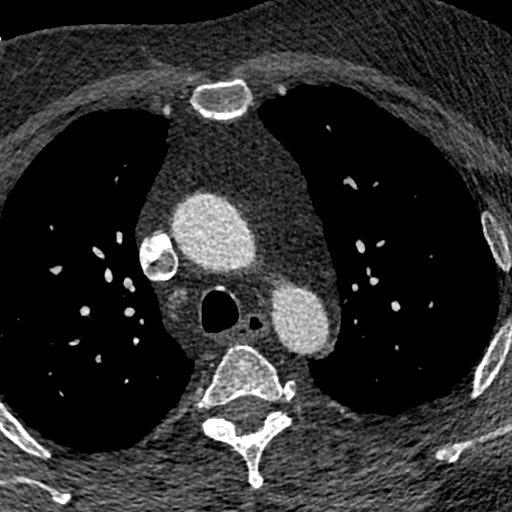
[im 2765/3017  vessel]
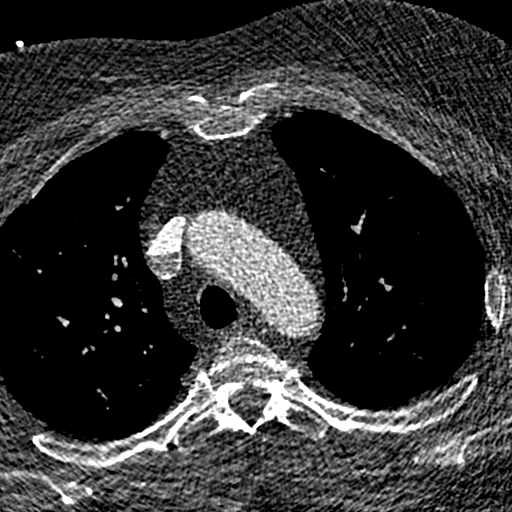

[11 of 20 positions shown; findings below may reference images not displayed]

FINDINGS: Large hiatal hernia. Within the visualized portions of the thorax
there are no suspicious appearing pulmonary nodules or masses, there
is no acute consolidative airspace disease, no pleural effusions, no
pneumothorax and no lymphadenopathy. Visualized portions of the
upper abdomen are unremarkable. There are no aggressive appearing
lytic or blastic lesions noted in the visualized portions of the
skeleton.
IMPRESSION: 1. Large hiatal hernia.
FINDINGS: Non-cardiac: See separate report from [REDACTED].

The left atrium was moderately dilated. No LA appendage thrombus
noted.

The pulmonary veins drained normally to the left atrium.

LUPV 17 x 12 mm

LLPV 14 x 10 mm

RUPV 18 x 14 mm

RLPV 15 x 14 mm

Calcium Score: 2.3 Agatston units.

Coronary Arteries: Right dominant with no anomalies

LM: No plaque or stenosis.

LAD system: Mixed plaque proximal LAD, minimal stenosis.

Circumflex system: Difficult images due to motion artifact, but
there does not appear to be black or stenosis in the LCx system.

RCA system: No plaque or stenosis.
IMPRESSION: 1.  No LA appendage thrombus noted.

2.  Pulmonary veins as noted above.

3.  No obstructive coronary disease.

4. Coronary artery calcium score 2.3 Agatston units. This places the
patient in the 16th percentile for age and gender, suggesting low
risk for future cardiac events.

Relindas Auad

*** End of Addendum ***
EXAM:
OVER-READ INTERPRETATION  CT CHEST

The following report is an over-read performed by radiologist Dr.
Delina Toma [REDACTED] on 12/21/2019. This
over-read does not include interpretation of cardiac or coronary
anatomy or pathology. The coronary calcium score/coronary CTA
interpretation by the cardiologist is attached.
FINDINGS: Large hiatal hernia. Within the visualized portions of the thorax
there are no suspicious appearing pulmonary nodules or masses, there
is no acute consolidative airspace disease, no pleural effusions, no
pneumothorax and no lymphadenopathy. Visualized portions of the
upper abdomen are unremarkable. There are no aggressive appearing
lytic or blastic lesions noted in the visualized portions of the
skeleton.
IMPRESSION: 1. Large hiatal hernia.

## 2020-07-31 IMAGING — DX DG CHEST 2V
2 series · 2 of 2 positions shown · non-contrast
Comparison: Chest radiograph dated 10/29/2019.

CLINICAL DATA: 68-year-old male with shortness of breath.

EXAM:
CHEST - 2 VIEW

[chest lat]
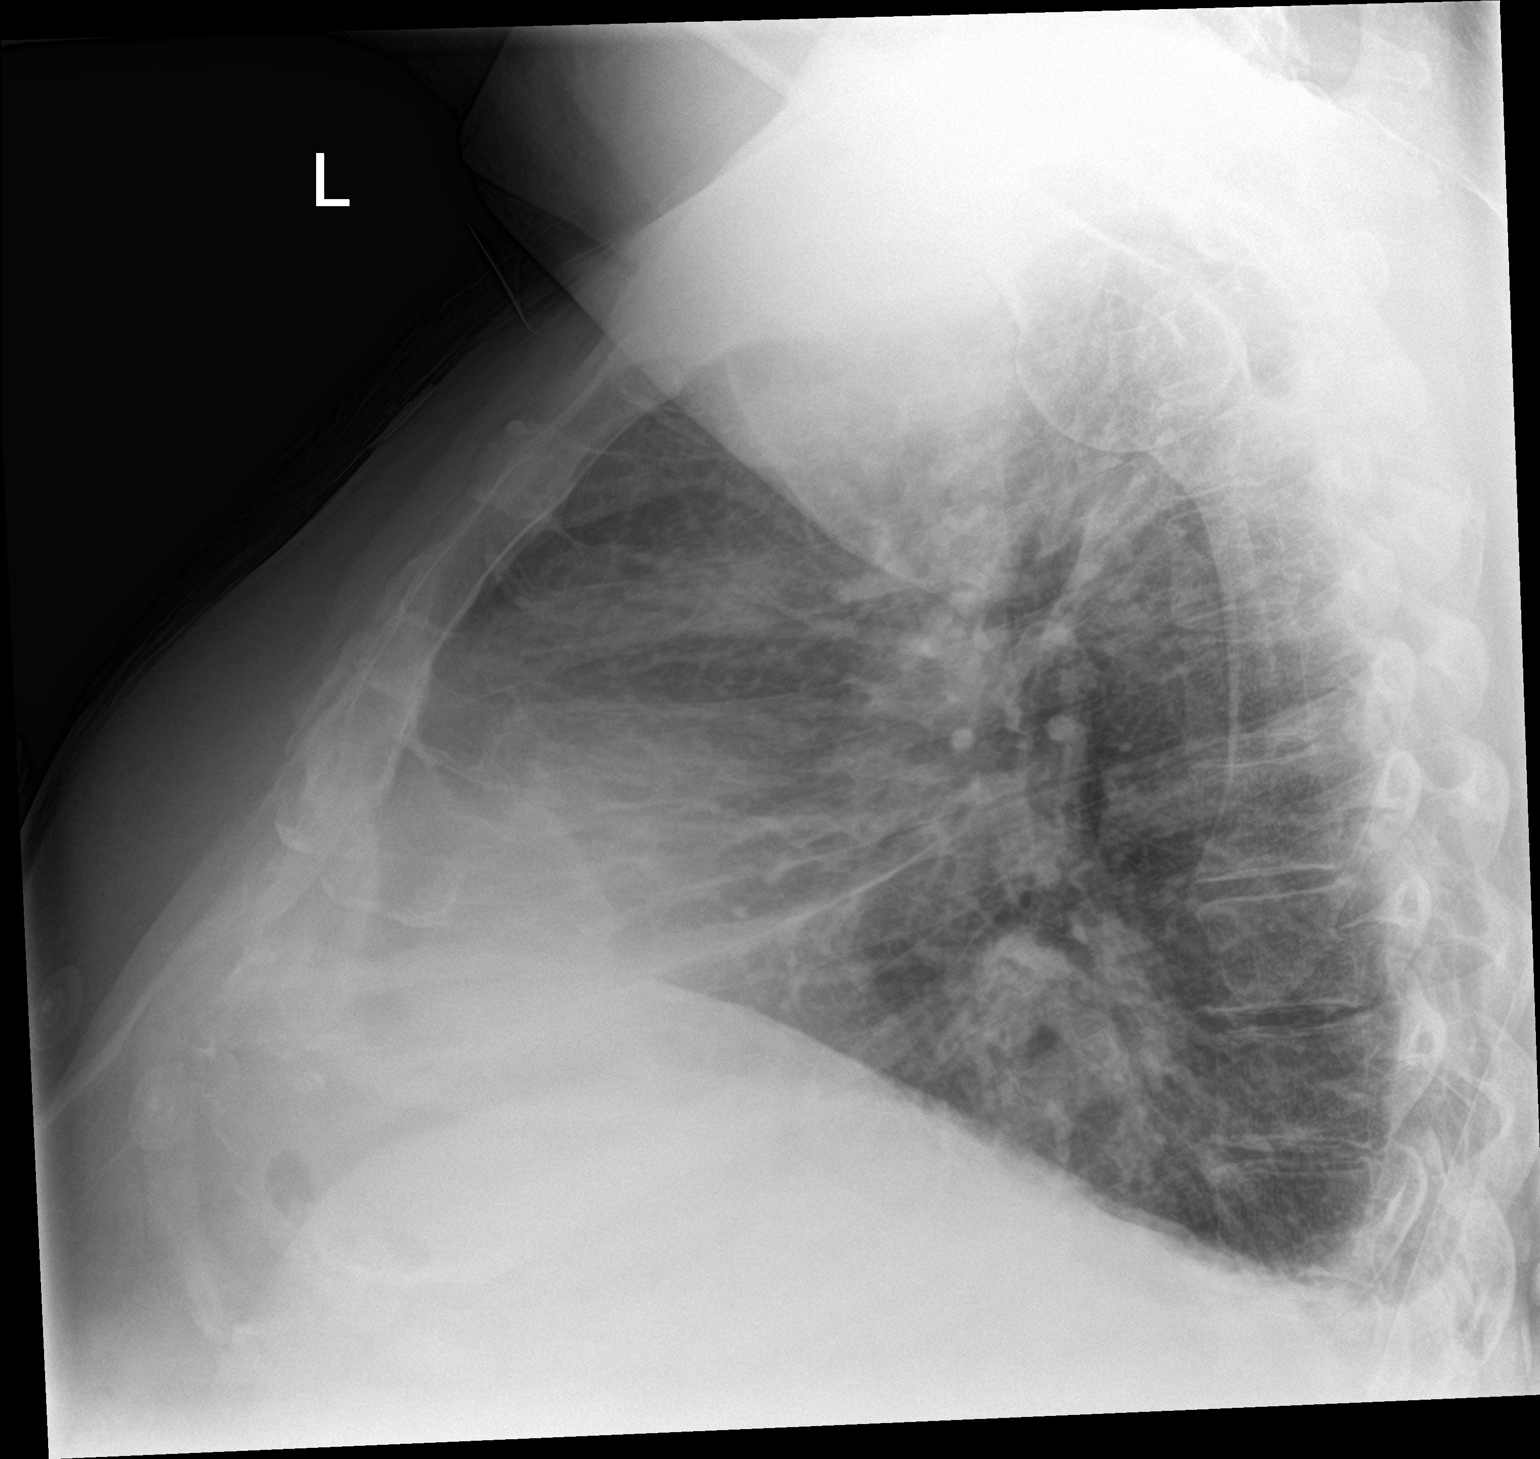

[chest ap]
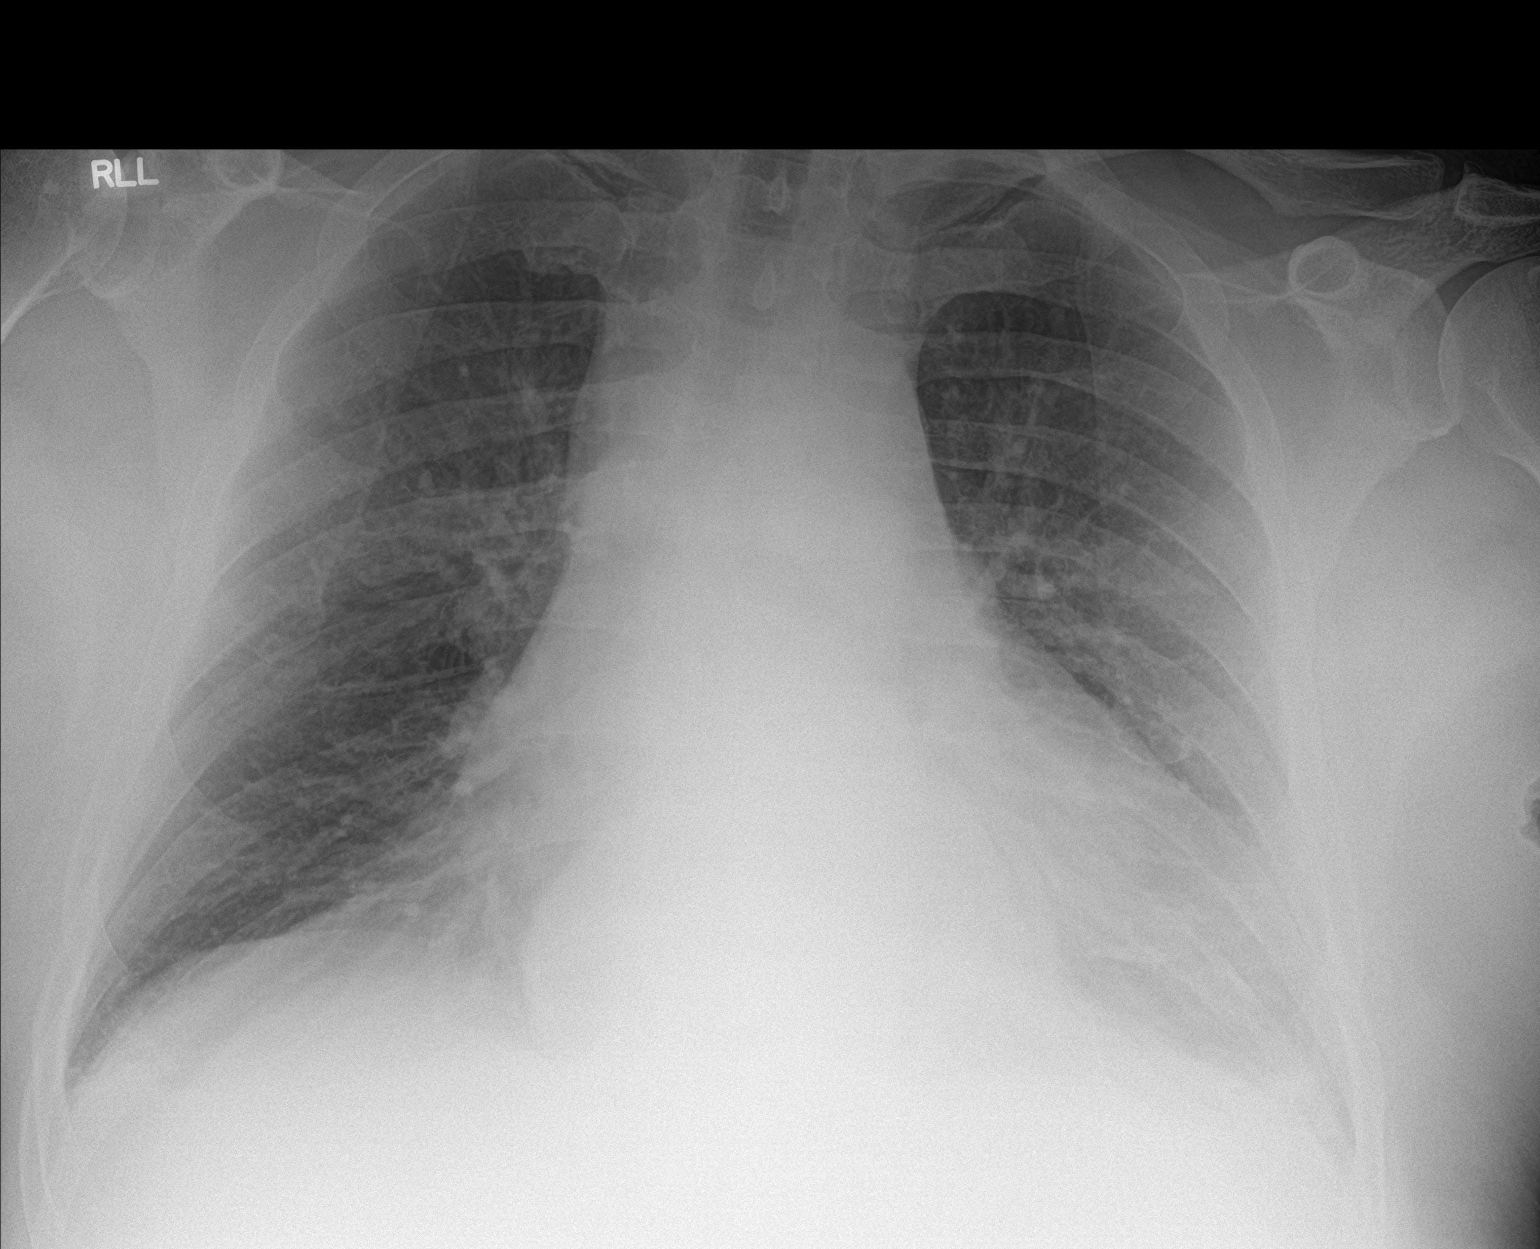

[2 of 2 positions shown; findings below may reference images not displayed]

FINDINGS: There are bibasilar atelectatic changes. A small left pleural
effusion may be present. No focal consolidation or pneumothorax.
There is mild cardiomegaly. Stable hiatal hernia. No acute osseous
pathology.
IMPRESSION: 1. Probable trace left pleural effusion and left lung base
atelectasis. No focal consolidation.
2. Stable cardiomegaly and moderate size hiatal hernia.

## 2020-08-03 DIAGNOSIS — Z860100 Personal history of colon polyps, unspecified: Secondary | ICD-10-CM

## 2020-08-03 DIAGNOSIS — D509 Iron deficiency anemia, unspecified: Secondary | ICD-10-CM | POA: Diagnosis not present

## 2020-08-03 DIAGNOSIS — M7989 Other specified soft tissue disorders: Secondary | ICD-10-CM | POA: Diagnosis not present

## 2020-08-03 DIAGNOSIS — I10 Essential (primary) hypertension: Secondary | ICD-10-CM | POA: Diagnosis not present

## 2020-08-03 DIAGNOSIS — K760 Fatty (change of) liver, not elsewhere classified: Secondary | ICD-10-CM

## 2020-08-03 DIAGNOSIS — K2270 Barrett's esophagus without dysplasia: Secondary | ICD-10-CM | POA: Insufficient documentation

## 2020-08-03 DIAGNOSIS — K22719 Barrett's esophagus with dysplasia, unspecified: Secondary | ICD-10-CM

## 2020-08-03 DIAGNOSIS — K921 Melena: Secondary | ICD-10-CM

## 2020-08-03 DIAGNOSIS — E1121 Type 2 diabetes mellitus with diabetic nephropathy: Secondary | ICD-10-CM | POA: Diagnosis not present

## 2020-08-03 DIAGNOSIS — M545 Low back pain, unspecified: Secondary | ICD-10-CM | POA: Diagnosis not present

## 2020-08-03 DIAGNOSIS — K3189 Other diseases of stomach and duodenum: Secondary | ICD-10-CM

## 2020-08-03 HISTORY — DX: Fatty (change of) liver, not elsewhere classified: K76.0

## 2020-08-03 HISTORY — DX: Barrett's esophagus without dysplasia: K22.70

## 2020-08-03 HISTORY — DX: Melena: K92.1

## 2020-08-03 HISTORY — DX: Personal history of colonic polyps: Z86.010

## 2020-08-03 HISTORY — DX: Other diseases of stomach and duodenum: K31.89

## 2020-08-03 HISTORY — DX: Personal history of colon polyps, unspecified: Z86.0100

## 2020-08-05 ENCOUNTER — Encounter: Payer: Self-pay | Admitting: Gastroenterology

## 2020-08-05 ENCOUNTER — Ambulatory Visit (INDEPENDENT_AMBULATORY_CARE_PROVIDER_SITE_OTHER): Payer: Medicare Other | Admitting: Gastroenterology

## 2020-08-05 ENCOUNTER — Other Ambulatory Visit (INDEPENDENT_AMBULATORY_CARE_PROVIDER_SITE_OTHER): Payer: Medicare Other

## 2020-08-05 ENCOUNTER — Telehealth: Payer: Self-pay

## 2020-08-05 VITALS — BP 128/68 | HR 86 | Ht 74.0 in | Wt 315.5 lb

## 2020-08-05 DIAGNOSIS — K3189 Other diseases of stomach and duodenum: Secondary | ICD-10-CM

## 2020-08-05 LAB — CBC
HCT: 42.8 % (ref 39.0–52.0)
Hemoglobin: 14.1 g/dL (ref 13.0–17.0)
MCHC: 32.9 g/dL (ref 30.0–36.0)
MCV: 95.5 fl (ref 78.0–100.0)
Platelets: 194 10*3/uL (ref 150.0–400.0)
RBC: 4.49 Mil/uL (ref 4.22–5.81)
RDW: 15.4 % (ref 11.5–15.5)
WBC: 6.5 10*3/uL (ref 4.0–10.5)

## 2020-08-05 NOTE — Progress Notes (Signed)
HPI: This is a very pleasant 69 year old man who was referred by Dr. Lyda Jester to consider endoscopic ultrasound  Earlier this year he was having overt GI bleeding and anemia.  He had 2 upper endoscopies by Dr. 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  The patient has had no overt GI bleeding in many many months.  He takes Eliquis daily for atrial fibrillation.  His atrial fibrillation was cured by ablation back in February however his cardiologist told him that he still needed to stay on it indefinitely.  I am not sure why.  He has GERD symptoms, a large hiatal hernia.  He has been on proton pump inhibitor twice daily for many years.  He has no dysphagia.  His weight is up 10 pounds in the past 6 months  Old Data Reviewed:  I reviewed some records from his primary gastroenterologist Dr. Lyda Jester in [facility].  There is an office note from July 2021 which she describes some recent care.   "Upper endoscopy January 2021 erosion at the base of the hiatal hernia possible source of GI bleed and melena, submucosal mass in base hiatal hernia 1 cm in diameter, Barrett's esophagus was not biopsied with recent bleeding large hiatal hernia"  "Upper endoscopy 4/21 healing of gastric ulcer at the base of hiatal hernia, submucosal mass 0.8 cm at the base of the hiatal hernia probable leiomyoma, large hiatal hernia and mild gastritis biopsies consistent with Barrett's esophagus"  "Endoscopy January 2020 ulcer at the base of hiatal hernia like a source of GI blood loss mild gastritis hiatal hernia Barrett's esophagus not biopsied"  "Upper endoscopy April 2019 large hiatal hernia, food remaining in the gastric fundus Barrett's esophagus 33 to 37 cm"  "Upper esophagus March 2017 Barrett's esophagus 35 to 40 cm hiatal hernia and gastric polyps benign"  "Upper endoscopy Dr.  Marzella Schlein November 2014 Barrett's esophagus"  "Upper endoscopy Dr. Marzella Schlein 02/2011 Barrett's esophagus"  A copy of January 2021 endoscopy report was also included in the packet.  This describes erosions at the base of the hiatal hernia as the possible source of his melena.  Submucosal mass at the base of the hiatal hernia 1 cm diameter.  History of Barrett's esophagus not biopsied and not inflamed.  Large hiatal hernia.  Blood work May 2021 hemoglobin 13.3 platelets 187, complete metabolic profile was normal Dr. Lyda Jester sent a packet of information asking "would it be reasonable to do EUS for submucosal mass?  Recurrent bleeding.  Please advise if EUS recommended"       Review of systems: Pertinent positive and negative review of systems were noted in the above HPI section. All other review negative.   Past Medical History:  Diagnosis Date  . Allergic rhinitis   . Arrhythmia   . Atrial fibrillation ([place])   . Benign prostatic hyperplasia with urinary obstruction 11/29/2018  . CHF (congestive heart failure) ([place])   . Diabetes mellitus due to underlying condition with unspecified complications ([place]) 63/05/4535  . Diabetes mellitus without complication ([place])   . DOE (dyspnea on exertion) 10/29/2019   S/p PE  11/2018 > DOAC recurrent 09/14/2019 off DOAC so resumed  - onset variable doe and orthostatic lightheadedness 06/2019 with afib - PFT's Oval Linsey  10/08/2019 :  FEV1 2.79 (73%) with ratio 75 and 6 % better p saba,  Air trapping on lung vol,  ERV 14% and  dlco 22.78 (60%) corrects to 3.68 (76%) for vol with minimal concavity to f/v loop  - 10/29/2019   Walked RA x two laps =  approx 529ft @ avg  . Essential hypertension 09/29/2019  . Fissure in skin of foot 12/11/2016  . GERD (gastroesophageal reflux disease) 11/29/2018  . Hemospermia 02/22/2020  . History of pulmonary embolism 09/29/2019  . Hyperlipidemia   . Hypertension   . Hypogonadism in male 02/22/2020  . IBS (irritable bowel syndrome)  11/29/2018  . Impotence 02/22/2020  . Incomplete emptying of bladder 02/22/2020  . Mixed dyslipidemia 09/29/2019  . Morbid obesity ([place]) 09/29/2019  . Overgrown toenails 12/11/2016  . Persistent atrial fibrillation ([place]) 09/29/2019  . Plantar fasciitis, bilateral 06/05/2016  . Secondary hypercoagulable state ([place]) 01/22/2020  . Upper airway cough syndrome 12/15/2019   Noted on exam 12/15/2019 onset while on Entresto, resolved with purse lip    Past Surgical History:  Procedure Laterality Date  . ATRIAL FIBRILLATION ABLATION N/A 12/25/2019   Procedure: ATRIAL FIBRILLATION ABLATION;  Surgeon: [name], MD;  Location: [facility];  Service: Cardiovascular;  Laterality: N/A;  . CARDIAC CATHETERIZATION    . KNEE SURGERY    . PILONIDAL CYST / SINUS EXCISION      Current Outpatient Medications  Medication Sig Dispense Refill  . albuterol (PROVENTIL) (2.5 MG/3ML) 0.083% nebulizer solution Take 2.5 mg by nebulization daily as needed for wheezing or shortness of breath.     Marland Kitchen albuterol (VENTOLIN HFA) 108 (90 Base) MCG/ACT inhaler Inhale 2 puffs into the lungs as needed for wheezing or shortness of breath.     . allopurinol (ZYLOPRIM) 100 MG tablet Take 100 mg by mouth daily.    . Ascorbic Acid (VITAMIN C) 1000 MG tablet Take 1,000 mg by mouth 2 (two) times daily.    Marland Kitchen atorvastatin (LIPITOR) 20 MG tablet Take 1 tablet (20 mg total) by mouth daily. 90 tablet 3  . B Complex-C (SUPER B COMPLEX PO) Take 1 capsule by mouth daily.     . Cholecalciferol (VITAMIN D3) 50 MCG (2000 UT) TABS Take 2,000 Units by mouth 2 (two) times daily.     Marland Kitchen ELIQUIS 5 MG TABS tablet Take 1 tablet (5 mg total) by mouth 2 (two) times daily. 180 tablet 1  . empagliflozin (JARDIANCE) 25 MG TABS tablet Take 25 mg by mouth daily.    . eszopiclone (LUNESTA) 2 MG TABS tablet Take 1 mg by mouth at bedtime.     . fluticasone (FLONASE) 50 MCG/ACT nasal spray Place 2 sprays into both nostrils daily as needed for allergies or  rhinitis.    . furosemide (LASIX) 40 MG tablet Take 40 mg by mouth daily as needed for edema.    . hydrALAZINE (APRESOLINE) 50 MG tablet Take 50 mg by mouth every 8 (eight) hours.    . Insulin Glargine (TOUJEO SOLOSTAR [place]) Inject 90 Units into the skin at bedtime.    . metoprolol succinate (TOPROL-XL) 50 MG 24 hr tablet Take 1 tablet (50 mg total) by mouth daily. Take with or immediately following a meal. 90 tablet 1  . montelukast (SINGULAIR) 10 MG tablet Take 10 mg by mouth at bedtime.    . Omega-3 Fatty Acids (FISH OIL) 1200 MG CAPS Take 1,200 mg by mouth 2 (two) times daily.     Marland Kitchen omeprazole (PRILOSEC) 40 MG capsule Take 40 mg by mouth 2 (two) times daily.     Marland Kitchen rOPINIRole (REQUIP) 2 MG tablet Take 2  mg by mouth at bedtime.    . sacubitril-valsartan (ENTRESTO) 49-51 MG Take 1 tablet by mouth 2 (two) times daily. 60 tablet 11  . tamsulosin (FLOMAX) 0.4 MG CAPS capsule Take 0.4 capsules by mouth 2 (two) times daily.    . traMADol (ULTRAM) 50 MG tablet Take 50 mg by mouth daily as needed.    . venlafaxine XR (EFFEXOR-XR) 150 MG 24 hr capsule Take 150 mg by mouth daily.     . Vitamin D, Ergocalciferol, (DRISDOL) 1.25 MG (50000 UT) CAPS capsule Take 50,000 Units by mouth every Tuesday.      No current facility-administered medications for this visit.    Allergies as of 08/05/2020 - Review Complete 08/05/2020  Allergen Reaction Noted  . Prednisone Other (See Comments) 09/29/2019  . Victoza [liraglutide] Diarrhea 11/30/2019    Family History  Problem Relation Age of Onset  . Hyperlipidemia Father   . Hypertension Father   . Colon cancer Father   . Liver cancer Father   . Lung cancer Father     Social History   Socioeconomic History  . Marital status: Married    Spouse name: Not on file  . Number of children: Not on file  . Years of education: Not on file  . Highest education level: Not on file  Occupational History  . Not on file  Tobacco Use  . Smoking status: Never Smoker   . Smokeless tobacco: Never Used  Vaping Use  . Vaping Use: Never used  Substance and Sexual Activity  . Alcohol use: Not Currently  . Drug use: Never  . Sexual activity: Not on file  Other Topics Concern  . Not on file  Social History Narrative  . Not on file   Social Determinants of Health   Financial Resource Strain:   . Difficulty of Paying Living Expenses: Not on file  Food Insecurity:   . Worried About Charity fundraiser in the Last Year: Not on file  . Ran Out of Food in the Last Year: Not on file  Transportation Needs:   . Lack of Transportation (Medical): Not on file  . Lack of Transportation (Non-Medical): Not on file  Physical Activity:   . Days of Exercise per Week: Not on file  . Minutes of Exercise per Session: Not on file  Stress:   . Feeling of Stress : Not on file  Social Connections:   . Frequency of Communication with Friends and Family: Not on file  . Frequency of Social Gatherings with Friends and Family: Not on file  . Attends Religious Services: Not on file  . Active Member of Clubs or Organizations: Not on file  . Attends Archivist Meetings: Not on file  . Marital Status: Not on file  Intimate Partner Violence:   . Fear of Current or Ex-Partner: Not on file  . Emotionally Abused: Not on file  . Physically Abused: Not on file  . Sexually Abused: Not on file     Physical Exam: BP 128/68 (BP Location: Left Arm, Patient Position: Sitting, Cuff Size: Large)   Pulse 86   Ht 6\' 2"  (1.88 m)   Wt (!) 315 lb 8 oz (143.1 kg)   SpO2 96%   BMI 40.51 kg/m  Constitutional: generally well-appearing, morbidly obese Psychiatric: alert and oriented x3 Eyes: extraocular movements intact Mouth: oral pharynx moist, no lesions Neck: supple no lymphadenopathy Cardiovascular: heart regular rate and rhythm Lungs: clear to auscultation bilaterally Abdomen: soft, nontender, nondistended, no  obvious ascites, no peritoneal signs, normal bowel  sounds Extremities: no lower extremity edema bilaterally: Several bruises on arms Skin: no lesions on visible extremities   Assessment and plan: 69 y.o. male with gastric subepithelial lesion noted on EGDs outside  His gastroenterologist asked that I evaluate the subepithelial lesions noted in his stomach with endoscopic ultrasound I am happy to do that.  They sound small, measured as 8 mm or 10 mm.  They have not been biopsied.  They are within a large hiatal hernia segment.  He is on a blood thinner for atrial fibrillation which is cured, after ablation.  I am not sure why he still needs to remain on that.  I asked that he stop it for 2 days prior to upper endoscopic ultrasound and we will clear that with his cardiologist in [place] to make sure he is okay with that recommendation.  He is getting a CBC today.   Please see the "Patient Instructions" section for addition details about the plan.   Owens Loffler, MD [facility] 08/05/2020, 10:30 AM  Cc: [name], MD  Total time on date of encounter was 45 minutes (this included time spent preparing to see the patient reviewing records; obtaining and/or reviewing separately obtained history; performing a medically appropriate exam and/or evaluation; counseling and educating the patient and family if present; ordering medications, tests or procedures if applicable; and documenting clinical information in the health record).

## 2020-08-05 NOTE — H&P (View-Only) (Signed)
HPI: This is a very pleasant 69 year old man who was referred by Dr. Lyda Jester to consider endoscopic ultrasound  Earlier this year he was having overt GI bleeding and anemia.  He had 2 upper endoscopies by Dr. 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  The patient has had no overt GI bleeding in many many months.  He takes Eliquis daily for atrial fibrillation.  His atrial fibrillation was cured by ablation back in February however his cardiologist told him that he still needed to stay on it indefinitely.  I am not sure why.  He has GERD symptoms, a large hiatal hernia.  He has been on proton pump inhibitor twice daily for many years.  He has no dysphagia.  His weight is up 10 pounds in the past 6 months  Old Data Reviewed:  I reviewed some records from his primary gastroenterologist Dr. Lyda Jester in [facility].  There is an office note from July 2021 which she describes some recent care.   "Upper endoscopy January 2021 erosion at the base of the hiatal hernia possible source of GI bleed and melena, submucosal mass in base hiatal hernia 1 cm in diameter, Barrett's esophagus was not biopsied with recent bleeding large hiatal hernia"  "Upper endoscopy 4/21 healing of gastric ulcer at the base of hiatal hernia, submucosal mass 0.8 cm at the base of the hiatal hernia probable leiomyoma, large hiatal hernia and mild gastritis biopsies consistent with Barrett's esophagus"  "Endoscopy January 2020 ulcer at the base of hiatal hernia like a source of GI blood loss mild gastritis hiatal hernia Barrett's esophagus not biopsied"  "Upper endoscopy April 2019 large hiatal hernia, food remaining in the gastric fundus Barrett's esophagus 33 to 37 cm"  "Upper esophagus March 2017 Barrett's esophagus 35 to 40 cm hiatal hernia and gastric polyps benign"  "Upper endoscopy Dr.  Marzella Schlein November 2014 Barrett's esophagus"  "Upper endoscopy Dr. Marzella Schlein 02/2011 Barrett's esophagus"  A copy of January 2021 endoscopy report was also included in the packet.  This describes erosions at the base of the hiatal hernia as the possible source of his melena.  Submucosal mass at the base of the hiatal hernia 1 cm diameter.  History of Barrett's esophagus not biopsied and not inflamed.  Large hiatal hernia.  Blood work May 2021 hemoglobin 13.3 platelets 187, complete metabolic profile was normal Dr. Lyda Jester sent a packet of information asking "would it be reasonable to do EUS for submucosal mass?  Recurrent bleeding.  Please advise if EUS recommended"       Review of systems: Pertinent positive and negative review of systems were noted in the above HPI section. All other review negative.   Past Medical History:  Diagnosis Date  . Allergic rhinitis   . Arrhythmia   . Atrial fibrillation ([place])   . Benign prostatic hyperplasia with urinary obstruction 11/29/2018  . CHF (congestive heart failure) ([place])   . Diabetes mellitus due to underlying condition with unspecified complications ([place]) 18/11/9935  . Diabetes mellitus without complication ([place])   . DOE (dyspnea on exertion) 10/29/2019   S/p PE  11/2018 > DOAC recurrent 09/14/2019 off DOAC so resumed  - onset variable doe and orthostatic lightheadedness 06/2019 with afib - PFT's Oval Linsey  10/08/2019 :  FEV1 2.79 (73%) with ratio 75 and 6 % better p saba,  Air trapping on lung vol,  ERV 14% and  dlco 22.78 (60%) corrects to 3.68 (76%) for vol with minimal concavity to f/v loop  - 10/29/2019   Walked RA x two laps =  approx 510ft @ avg  . Essential hypertension 09/29/2019  . Fissure in skin of foot 12/11/2016  . GERD (gastroesophageal reflux disease) 11/29/2018  . Hemospermia 02/22/2020  . History of pulmonary embolism 09/29/2019  . Hyperlipidemia   . Hypertension   . Hypogonadism in male 02/22/2020  . IBS (irritable bowel syndrome)  11/29/2018  . Impotence 02/22/2020  . Incomplete emptying of bladder 02/22/2020  . Mixed dyslipidemia 09/29/2019  . Morbid obesity ([place]) 09/29/2019  . Overgrown toenails 12/11/2016  . Persistent atrial fibrillation ([place]) 09/29/2019  . Plantar fasciitis, bilateral 06/05/2016  . Secondary hypercoagulable state ([place]) 01/22/2020  . Upper airway cough syndrome 12/15/2019   Noted on exam 12/15/2019 onset while on Entresto, resolved with purse lip    Past Surgical History:  Procedure Laterality Date  . ATRIAL FIBRILLATION ABLATION N/A 12/25/2019   Procedure: ATRIAL FIBRILLATION ABLATION;  Surgeon: [name], MD;  Location: [facility];  Service: Cardiovascular;  Laterality: N/A;  . CARDIAC CATHETERIZATION    . KNEE SURGERY    . PILONIDAL CYST / SINUS EXCISION      Current Outpatient Medications  Medication Sig Dispense Refill  . albuterol (PROVENTIL) (2.5 MG/3ML) 0.083% nebulizer solution Take 2.5 mg by nebulization daily as needed for wheezing or shortness of breath.     Marland Kitchen albuterol (VENTOLIN HFA) 108 (90 Base) MCG/ACT inhaler Inhale 2 puffs into the lungs as needed for wheezing or shortness of breath.     . allopurinol (ZYLOPRIM) 100 MG tablet Take 100 mg by mouth daily.    . Ascorbic Acid (VITAMIN C) 1000 MG tablet Take 1,000 mg by mouth 2 (two) times daily.    Marland Kitchen atorvastatin (LIPITOR) 20 MG tablet Take 1 tablet (20 mg total) by mouth daily. 90 tablet 3  . B Complex-C (SUPER B COMPLEX PO) Take 1 capsule by mouth daily.     . Cholecalciferol (VITAMIN D3) 50 MCG (2000 UT) TABS Take 2,000 Units by mouth 2 (two) times daily.     Marland Kitchen ELIQUIS 5 MG TABS tablet Take 1 tablet (5 mg total) by mouth 2 (two) times daily. 180 tablet 1  . empagliflozin (JARDIANCE) 25 MG TABS tablet Take 25 mg by mouth daily.    . eszopiclone (LUNESTA) 2 MG TABS tablet Take 1 mg by mouth at bedtime.     . fluticasone (FLONASE) 50 MCG/ACT nasal spray Place 2 sprays into both nostrils daily as needed for allergies or  rhinitis.    . furosemide (LASIX) 40 MG tablet Take 40 mg by mouth daily as needed for edema.    . hydrALAZINE (APRESOLINE) 50 MG tablet Take 50 mg by mouth every 8 (eight) hours.    . Insulin Glargine (TOUJEO SOLOSTAR [place]) Inject 90 Units into the skin at bedtime.    . metoprolol succinate (TOPROL-XL) 50 MG 24 hr tablet Take 1 tablet (50 mg total) by mouth daily. Take with or immediately following a meal. 90 tablet 1  . montelukast (SINGULAIR) 10 MG tablet Take 10 mg by mouth at bedtime.    . Omega-3 Fatty Acids (FISH OIL) 1200 MG CAPS Take 1,200 mg by mouth 2 (two) times daily.     Marland Kitchen omeprazole (PRILOSEC) 40 MG capsule Take 40 mg by mouth 2 (two) times daily.     Marland Kitchen rOPINIRole (REQUIP) 2 MG tablet Take 2  mg by mouth at bedtime.    . sacubitril-valsartan (ENTRESTO) 49-51 MG Take 1 tablet by mouth 2 (two) times daily. 60 tablet 11  . tamsulosin (FLOMAX) 0.4 MG CAPS capsule Take 0.4 capsules by mouth 2 (two) times daily.    . traMADol (ULTRAM) 50 MG tablet Take 50 mg by mouth daily as needed.    . venlafaxine XR (EFFEXOR-XR) 150 MG 24 hr capsule Take 150 mg by mouth daily.     . Vitamin D, Ergocalciferol, (DRISDOL) 1.25 MG (50000 UT) CAPS capsule Take 50,000 Units by mouth every Tuesday.      No current facility-administered medications for this visit.    Allergies as of 08/05/2020 - Review Complete 08/05/2020  Allergen Reaction Noted  . Prednisone Other (See Comments) 09/29/2019  . Victoza [liraglutide] Diarrhea 11/30/2019    Family History  Problem Relation Age of Onset  . Hyperlipidemia Father   . Hypertension Father   . Colon cancer Father   . Liver cancer Father   . Lung cancer Father     Social History   Socioeconomic History  . Marital status: Married    Spouse name: Not on file  . Number of children: Not on file  . Years of education: Not on file  . Highest education level: Not on file  Occupational History  . Not on file  Tobacco Use  . Smoking status: Never Smoker   . Smokeless tobacco: Never Used  Vaping Use  . Vaping Use: Never used  Substance and Sexual Activity  . Alcohol use: Not Currently  . Drug use: Never  . Sexual activity: Not on file  Other Topics Concern  . Not on file  Social History Narrative  . Not on file   Social Determinants of Health   Financial Resource Strain:   . Difficulty of Paying Living Expenses: Not on file  Food Insecurity:   . Worried About Charity fundraiser in the Last Year: Not on file  . Ran Out of Food in the Last Year: Not on file  Transportation Needs:   . Lack of Transportation (Medical): Not on file  . Lack of Transportation (Non-Medical): Not on file  Physical Activity:   . Days of Exercise per Week: Not on file  . Minutes of Exercise per Session: Not on file  Stress:   . Feeling of Stress : Not on file  Social Connections:   . Frequency of Communication with Friends and Family: Not on file  . Frequency of Social Gatherings with Friends and Family: Not on file  . Attends Religious Services: Not on file  . Active Member of Clubs or Organizations: Not on file  . Attends Archivist Meetings: Not on file  . Marital Status: Not on file  Intimate Partner Violence:   . Fear of Current or Ex-Partner: Not on file  . Emotionally Abused: Not on file  . Physically Abused: Not on file  . Sexually Abused: Not on file     Physical Exam: BP 128/68 (BP Location: Left Arm, Patient Position: Sitting, Cuff Size: Large)   Pulse 86   Ht 6\' 2"  (1.88 m)   Wt (!) 315 lb 8 oz (143.1 kg)   SpO2 96%   BMI 40.51 kg/m  Constitutional: generally well-appearing, morbidly obese Psychiatric: alert and oriented x3 Eyes: extraocular movements intact Mouth: oral pharynx moist, no lesions Neck: supple no lymphadenopathy Cardiovascular: heart regular rate and rhythm Lungs: clear to auscultation bilaterally Abdomen: soft, nontender, nondistended, no  obvious ascites, no peritoneal signs, normal bowel  sounds Extremities: no lower extremity edema bilaterally: Several bruises on arms Skin: no lesions on visible extremities   Assessment and plan: 69 y.o. male with gastric subepithelial lesion noted on EGDs outside  His gastroenterologist asked that I evaluate the subepithelial lesions noted in his stomach with endoscopic ultrasound I am happy to do that.  They sound small, measured as 8 mm or 10 mm.  They have not been biopsied.  They are within a large hiatal hernia segment.  He is on a blood thinner for atrial fibrillation which is cured, after ablation.  I am not sure why he still needs to remain on that.  I asked that he stop it for 2 days prior to upper endoscopic ultrasound and we will clear that with his cardiologist in [place] to make sure he is okay with that recommendation.  He is getting a CBC today.   Please see the "Patient Instructions" section for addition details about the plan.   Owens Loffler, MD [facility] 08/05/2020, 10:30 AM  Cc: [name], MD  Total time on date of encounter was 45 minutes (this included time spent preparing to see the patient reviewing records; obtaining and/or reviewing separately obtained history; performing a medically appropriate exam and/or evaluation; counseling and educating the patient and family if present; ordering medications, tests or procedures if applicable; and documenting clinical information in the health record).

## 2020-08-05 NOTE — Patient Instructions (Signed)
If you are age 71 or older, your body mass index should be between 23-30. Your Body mass index is 40.51 kg/m. If this is out of the aforementioned range listed, please consider follow up with your Primary Care Provider.  If you are age 65 or younger, your body mass index should be between 19-25. Your Body mass index is 40.51 kg/m. If this is out of the aformentioned range listed, please consider follow up with your Primary Care Provider.   Your provider has requested that you go to the basement level for lab work before leaving today. Press "B" on the elevator. The lab is located at the first door on the left as you exit the elevator.  You have been scheduled for an upper Endoscopic Ultrasound. Please follow written instructions given to you at your visit today. If you use inhalers (even only as needed), please bring them with you on the day of your procedure.  You will be notified by [facility] nurse team in regards to which medications you should or should not take prior to the procedure.  Due to recent changes in healthcare laws, you may see the results of your imaging and laboratory studies on MyChart before your provider has had a chance to review them.  We understand that in some cases there may be results that are confusing or concerning to you. Not all laboratory results come back in the same time frame and the provider may be waiting for multiple results in order to interpret others.  Please give [place] 48 hours in order for your provider to thoroughly review all the results before contacting the office for clarification of your results.   Thank you for entrusting me with your care and choosing [facility].  Dr Ardis Hughs'

## 2020-08-05 NOTE — Telephone Encounter (Signed)
[facility] Pre-operative Risk Assessment     Request for surgical clearance:     Endoscopy Procedure  What type of surgery is being performed?     Upper Endoscopic Ultrasound  When is this surgery scheduled?     08-11-20  What type of clearance is required ?   Pharmacy  Are there any medications that need to be held prior to surgery and how long? Eliquis x 2 days  Practice name and name of physician performing surgery?      [facility]  What is your office phone and fax number?      Phone- [phone]  [phone]  Anesthesia type (None, local, MAC, general) ?       MAC

## 2020-08-05 NOTE — Telephone Encounter (Signed)
Patient with diagnosis of afib on Eliquis for anticoagulation.    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 I would recommend holding anticoagulation ONLY 1 day prior. I will also send to MD for input

## 2020-08-08 ENCOUNTER — Other Ambulatory Visit: Payer: Self-pay

## 2020-08-08 ENCOUNTER — Other Ambulatory Visit (HOSPITAL_COMMUNITY)
Admission: RE | Admit: 2020-08-08 | Discharge: 2020-08-08 | Disposition: A | Discharge status: Home / Self Care | Payer: Medicare Other | Source: Ambulatory Visit | Attending: Gastroenterology | Admitting: Gastroenterology

## 2020-08-08 ENCOUNTER — Encounter (HOSPITAL_COMMUNITY): Payer: Self-pay | Admitting: Gastroenterology

## 2020-08-08 DIAGNOSIS — Z20822 Contact with and (suspected) exposure to covid-19: Secondary | ICD-10-CM | POA: Diagnosis not present

## 2020-08-08 DIAGNOSIS — Z01812 Encounter for preprocedural laboratory examination: Secondary | ICD-10-CM | POA: Diagnosis not present

## 2020-08-08 LAB — SARS CORONAVIRUS 2 (TAT 6-24 HRS): SARS Coronavirus 2: NEGATIVE

## 2020-08-08 NOTE — Telephone Encounter (Signed)
Patient is requesting information on when to hold his Eliquis medication

## 2020-08-08 NOTE — Telephone Encounter (Signed)
Agree with pharmacy recommendations above. Can hold for 2 days if necessary.

## 2020-08-08 NOTE — Telephone Encounter (Signed)
° °  Primary Cardiologist: Will Meredith Leeds, MD  Chart reviewed as part of pre-operative protocol coverage. Given past medical history and time since last visit, based on ACC/AHA guidelines, Jlynn Langille. would be at acceptable risk for the planned procedure without further cardiovascular testing.   Patient with diagnosis of afib on Eliquis for anticoagulation.    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 Please resume as soon as hemostasis is achieved  Patient was advised that if he develops new symptoms prior to surgery to contact our office to arrange a follow-up appointment.  He verbalized understanding.  I will route this recommendation to the requesting party via Epic fax function and remove from pre-op pool.  Please call with questions.  Jossie Ng. Jerriann Schrom NP-C    08/08/2020, 12:57 PM [address] Office [phone] Fax [phone]

## 2020-08-09 NOTE — Telephone Encounter (Signed)
Patient aware that he can hold Eliquis 2 days prior to upper EUS scheduled for 08-11-20. Patient advised that he will be instructed when to restart Eliquis on the day of the procedure.  Patient agreed to plan and verbalized understanding.  No further questions.

## 2020-08-11 ENCOUNTER — Ambulatory Visit (HOSPITAL_COMMUNITY): Payer: Medicare Other | Admitting: Certified Registered Nurse Anesthetist

## 2020-08-11 ENCOUNTER — Encounter (HOSPITAL_COMMUNITY)
Admission: RE | Disposition: A | Discharge status: Home / Self Care | Payer: Self-pay | Source: Home / Self Care | Attending: Gastroenterology

## 2020-08-11 ENCOUNTER — Other Ambulatory Visit: Payer: Self-pay

## 2020-08-11 ENCOUNTER — Encounter (HOSPITAL_COMMUNITY): Payer: Self-pay | Admitting: Gastroenterology

## 2020-08-11 ENCOUNTER — Ambulatory Visit (HOSPITAL_COMMUNITY)
Admission: RE | Admit: 2020-08-11 | Discharge: 2020-08-11 | Disposition: A | Discharge status: Home / Self Care | Payer: Medicare Other | Attending: Gastroenterology | Admitting: Gastroenterology

## 2020-08-11 ENCOUNTER — Telehealth: Payer: Self-pay

## 2020-08-11 DIAGNOSIS — Z8719 Personal history of other diseases of the digestive system: Secondary | ICD-10-CM | POA: Diagnosis not present

## 2020-08-11 DIAGNOSIS — K3189 Other diseases of stomach and duodenum: Secondary | ICD-10-CM

## 2020-08-11 DIAGNOSIS — N400 Enlarged prostate without lower urinary tract symptoms: Secondary | ICD-10-CM | POA: Insufficient documentation

## 2020-08-11 DIAGNOSIS — G4730 Sleep apnea, unspecified: Secondary | ICD-10-CM | POA: Insufficient documentation

## 2020-08-11 DIAGNOSIS — K449 Diaphragmatic hernia without obstruction or gangrene: Secondary | ICD-10-CM | POA: Insufficient documentation

## 2020-08-11 DIAGNOSIS — E119 Type 2 diabetes mellitus without complications: Secondary | ICD-10-CM | POA: Diagnosis not present

## 2020-08-11 DIAGNOSIS — I110 Hypertensive heart disease with heart failure: Secondary | ICD-10-CM | POA: Diagnosis not present

## 2020-08-11 DIAGNOSIS — Z6838 Body mass index (BMI) 38.0-38.9, adult: Secondary | ICD-10-CM | POA: Diagnosis not present

## 2020-08-11 DIAGNOSIS — I509 Heart failure, unspecified: Secondary | ICD-10-CM | POA: Diagnosis not present

## 2020-08-11 DIAGNOSIS — K219 Gastro-esophageal reflux disease without esophagitis: Secondary | ICD-10-CM | POA: Diagnosis not present

## 2020-08-11 DIAGNOSIS — I4891 Unspecified atrial fibrillation: Secondary | ICD-10-CM | POA: Insufficient documentation

## 2020-08-11 DIAGNOSIS — Z8349 Family history of other endocrine, nutritional and metabolic diseases: Secondary | ICD-10-CM | POA: Insufficient documentation

## 2020-08-11 DIAGNOSIS — E782 Mixed hyperlipidemia: Secondary | ICD-10-CM | POA: Insufficient documentation

## 2020-08-11 DIAGNOSIS — Z79899 Other long term (current) drug therapy: Secondary | ICD-10-CM | POA: Insufficient documentation

## 2020-08-11 DIAGNOSIS — Z8249 Family history of ischemic heart disease and other diseases of the circulatory system: Secondary | ICD-10-CM | POA: Insufficient documentation

## 2020-08-11 DIAGNOSIS — K229 Disease of esophagus, unspecified: Secondary | ICD-10-CM | POA: Insufficient documentation

## 2020-08-11 DIAGNOSIS — K317 Polyp of stomach and duodenum: Secondary | ICD-10-CM | POA: Insufficient documentation

## 2020-08-11 DIAGNOSIS — Z794 Long term (current) use of insulin: Secondary | ICD-10-CM | POA: Diagnosis not present

## 2020-08-11 DIAGNOSIS — Z7901 Long term (current) use of anticoagulants: Secondary | ICD-10-CM | POA: Insufficient documentation

## 2020-08-11 DIAGNOSIS — R1900 Intra-abdominal and pelvic swelling, mass and lump, unspecified site: Secondary | ICD-10-CM | POA: Diagnosis present

## 2020-08-11 DIAGNOSIS — Z86711 Personal history of pulmonary embolism: Secondary | ICD-10-CM | POA: Insufficient documentation

## 2020-08-11 HISTORY — DX: Obstructive sleep apnea (adult) (pediatric): Z99.89

## 2020-08-11 HISTORY — PX: BIOPSY: SHX5522

## 2020-08-11 HISTORY — PX: ESOPHAGOGASTRODUODENOSCOPY (EGD) WITH PROPOFOL: SHX5813

## 2020-08-11 HISTORY — PX: UPPER ESOPHAGEAL ENDOSCOPIC ULTRASOUND (EUS): SHX6562

## 2020-08-11 HISTORY — DX: Obstructive sleep apnea (adult) (pediatric): G47.33

## 2020-08-11 LAB — GLUCOSE, CAPILLARY: Glucose-Capillary: 128 mg/dL — ABNORMAL HIGH (ref 70–99)

## 2020-08-11 SURGERY — UPPER ESOPHAGEAL ENDOSCOPIC ULTRASOUND (EUS)
Anesthesia: Monitor Anesthesia Care

## 2020-08-11 MED ORDER — PROPOFOL 500 MG/50ML IV EMUL
INTRAVENOUS | Status: DC | PRN
  Administered 2020-08-11: 80 mg via INTRAVENOUS
  Administered 2020-08-11: 100 ug/kg/min via INTRAVENOUS

## 2020-08-11 MED ORDER — SODIUM CHLORIDE 0.9 % IV SOLN: INTRAVENOUS | Status: DC

## 2020-08-11 MED ORDER — LACTATED RINGERS IV SOLN
INTRAVENOUS | Status: DC
  Administered 2020-08-11: 1000 mL via INTRAVENOUS

## 2020-08-11 NOTE — Op Note (Signed)
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 Estimated blood loss:                            None. Estimated Blood Loss:     Estimated blood loss: none. Procedure:                Pre-Anesthesia Assessment:                           - Prior to the procedure, a History and Physical                            was performed, and patient medications and                            allergies were reviewed. The patient's tolerance of                            previous anesthesia was also reviewed. The risks                            and benefits of the procedure and the sedation                            options and risks were discussed with the patient.                            All questions were answered, and informed consent                            was obtained. Prior Anticoagulants: The patient has                            taken Eliquis (apixaban), last dose was 4 days                            prior to procedure. ASA Grade Assessment: IV - A  patient with severe systemic disease that is a                            constant threat to life. After reviewing the risks                            and benefits, the patient was deemed in                            satisfactory condition to undergo the procedure.                           After obtaining informed consent, the endoscope was                             passed under direct vision. Throughout the                            procedure, the patient's blood pressure, pulse, and                            oxygen saturations were monitored continuously. The                            [id number]-[id number] ([id number]) was introduced through the                            mouth, and advanced to the second part of duodenum.                            The [id number] ([id number]) Olympus Linear EUS was                            introduced through the mouth, and advanced to the                            second part of duodenum. The upper EUS was                            accomplished without difficulty. The patient                            tolerated the procedure well. Scope In: Scope Out: Findings:      ENDOSCOPIC FINDING: :      1. Irregular, non-nodular Z line.      2. Large hiatal hernia without overt Cameron's type erosions. There were       several (4-5) subcentimer friable inflammatory (or hyperplastic)       appearing polyps within and just distal to the hiatal hernia segment.      3. 1-2cm subepithelial bulge just distal to the hiatal hernia segment.       The lesion was soft and there was a central ubilication. Following EUS       evaluation I performed tunnel, bite on bite forcept biopsies of the  lesion.      4. Normal duodenum.      ENDOSONOGRAPHIC FINDING: :      1. THe subepithelial lesion described above correlated with a 76mm       heterogeneous, hypoechoic rounded lesion that was clearly confined to       the deep mucosa and submucosal layer of the gastric wall. The muscularis       propria was not involved. I did not sample the lesion with FNA. Impression:               1. Irregular, non-nodular Z line.                           2. Large hiatal hernia without overt Cameron's type                            erosions. There were several (4-5) subcentimer                            friable inflammatory (or hyperplastic)  appearing                            polyps within and just distal to the hiatal hernia                            segment.                           3. 1-2cm subepithelial bulge just distal to the                            hiatal hernia segment. The lesion was soft and                            there was a central ubilication. Following EUS                            evaluation I performed tunnel, bite on bite forcept                            biopsies of the lesion. By EUS evalution the lesion                            was 52mm and clearly did not involve the muscularis                            propria layer of the gastric wall (was confined to                            deep mucosa, submucosa). 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 Normal duodenum. Moderate Sedation:      Not Applicable - Patient had care per Anesthesia. Recommendation:           - Discharge patient to home.                           - Await final pathology results. Procedure Code(s):        --- Professional ---                           [phone], Esophagogastroduodenoscopy, flexible,                            transoral; with endoscopic ultrasound examination                            limited to the esophagus, stomach or duodenum, and                            adjacent structures                           43239, Esophagogastroduodenoscopy, flexible,                            transoral; with biopsy, single or multiple Diagnosis Code(s):        --- Professional ---                           K31.7, Polyp of stomach and duodenum                           K31.89, Other diseases of stomach and duodenum CPT copyright 2019 American Medical Association. All rights reserved. The codes documented in this report are preliminary and upon coder review may  be revised to meet current compliance requirements. [name], MD 08/11/2020 9:28:04 AM This  report has been signed electronically. Number of Addenda: 0

## 2020-08-11 NOTE — Anesthesia Postprocedure Evaluation (Signed)
Anesthesia Post Note  Patient: Sean [name].  Procedure(s) Performed: UPPER ESOPHAGEAL ENDOSCOPIC ULTRASOUND (EUS) (N/A )     Patient location during evaluation: Endoscopy Anesthesia Type: MAC Level of consciousness: awake and alert Pain management: pain level controlled Vital Signs Assessment: post-procedure vital signs reviewed and stable Respiratory status: spontaneous breathing, nonlabored ventilation and respiratory function stable Cardiovascular status: blood pressure returned to baseline and stable Postop Assessment: no apparent nausea or vomiting Anesthetic complications: no   No complications documented.  Last Vitals:  Vitals:   08/11/20 0930 08/11/20 0940  BP: (!) 120/50 126/71  Pulse: (!) 59 65  Resp: 14 20  Temp:    SpO2: 96% 99%    Last Pain:  Vitals:   08/11/20 0940  TempSrc:   PainSc: 0-No pain                 Lidia Collum

## 2020-08-11 NOTE — Interval H&P Note (Signed)
History and Physical Interval Note:  08/11/2020 8:03 AM  Sean [name].  has presented today for surgery, with the diagnosis of gastric mass.  The various methods of treatment have been discussed with the patient and family. After consideration of risks, benefits and other options for treatment, the patient has consented to  Procedure(s): UPPER ESOPHAGEAL ENDOSCOPIC ULTRASOUND (EUS) (N/A) as a surgical intervention.  The patient's history has been reviewed, patient examined, no change in status, stable for surgery.  I have reviewed the patient's chart and labs.  Questions were answered to the patient's satisfaction.     [name]

## 2020-08-11 NOTE — Anesthesia Preprocedure Evaluation (Addendum)
Anesthesia Evaluation  Patient identified by MRN, date of birth, ID band Patient awake    Reviewed: Allergy & Precautions, NPO status , Patient's Chart, lab work & pertinent test results  History of Anesthesia Complications Negative for: history of anesthetic complications  Airway Mallampati: II  TM Distance: >3 FB Neck ROM: Full    Dental   Pulmonary sleep apnea and Continuous Positive Airway Pressure Ventilation , PE   Pulmonary exam normal        Cardiovascular hypertension, +CHF  Normal cardiovascular exam+ dysrhythmias (s/p ablation) Atrial Fibrillation      Neuro/Psych negative neurological ROS  negative psych ROS   GI/Hepatic Neg liver ROS, hiatal hernia, GERD  ,Gastric mass   Endo/Other  diabetesMorbid obesity  Renal/GU negative Renal ROS  negative genitourinary   Musculoskeletal negative musculoskeletal ROS (+)   Abdominal   Peds  Hematology Eliquis   Anesthesia Other Findings  Echo 03/11/20: EF 60-65%, borderline LVH, gr1dd, normal RV function/PASP, normal valves  Reproductive/Obstetrics                           Anesthesia Physical Anesthesia Plan  ASA: III  Anesthesia Plan: MAC   Post-op Pain Management:    Induction: Intravenous  PONV Risk Score and Plan: 1 and Propofol infusion, TIVA and Treatment may vary due to age or medical condition  Airway Management Planned: Natural Airway, Nasal Cannula and Simple Face Mask  Additional Equipment: None  Intra-op Plan:   Post-operative Plan:   Informed Consent: I have reviewed the patients History and Physical, chart, labs and discussed the procedure including the risks, benefits and alternatives for the proposed anesthesia with the patient or authorized representative who has indicated his/her understanding and acceptance.       Plan Discussed with:   Anesthesia Plan Comments:        Anesthesia Quick  Evaluation

## 2020-08-11 NOTE — Telephone Encounter (Signed)
-----   Message from [name], MD sent at 08/11/2020  9:30 AM EDT ----- Can you please send a copy of today's EUS to Dr. Sandria Senter.  Thanks

## 2020-08-11 NOTE — Telephone Encounter (Signed)
EUS procedure report faxed to Dr. Kyra Leyland and PCP Dr. [name]

## 2020-08-11 NOTE — Transfer of Care (Signed)
Immediate Anesthesia Transfer of Care Note  Patient: Sean [name].  Procedure(s) Performed: Procedure(s): UPPER ESOPHAGEAL ENDOSCOPIC ULTRASOUND (EUS) (N/A)  Patient Location: PACU  Anesthesia Type:MAC  Level of Consciousness: Alert, Awake, Oriented  Airway & Oxygen Therapy: Patient Spontanous Breathing  Post-op Assessment: Report given to RN  Post vital signs: Reviewed and stable  Last Vitals:  Vitals:   08/11/20 0808  BP: 134/61  Pulse: 67  Resp: 18  Temp: 36.8 C  SpO2: 89%    Complications: No apparent anesthesia complications

## 2020-08-11 NOTE — Discharge Instructions (Signed)
YOU HAD AN ENDOSCOPIC PROCEDURE TODAY: Refer to the procedure report and other information in the discharge instructions given to you for any specific questions about what was found during the examination. If this information does not answer your questions, please call [place] office at [phone] to clarify.   YOU SHOULD EXPECT: Some feelings of bloating in the abdomen. Passage of more gas than usual. Walking can help get rid of the air that was put into your GI tract during the procedure and reduce the bloating. If you had a lower endoscopy (such as a colonoscopy or flexible sigmoidoscopy) you may notice spotting of blood in your stool or on the toilet paper. Some abdominal soreness may be present for a day or two, also.  DIET: Your first meal following the procedure should be a light meal and then it is ok to progress to your normal diet. A half-sandwich or bowl of soup is an example of a good first meal. Heavy or fried foods are harder to digest and may make you feel nauseous or bloated. Drink plenty of fluids but you should avoid alcoholic beverages for 24 hours. If you had a esophageal dilation, please see attached instructions for diet.    ACTIVITY: Your care partner should take you home directly after the procedure. You should plan to take it easy, moving slowly for the rest of the day. You can resume normal activity the day after the procedure however YOU SHOULD NOT DRIVE, use power tools, machinery or perform tasks that involve climbing or major physical exertion for 24 hours (because of the sedation medicines used during the test).   SYMPTOMS TO REPORT IMMEDIATELY: A gastroenterologist can be reached at any hour. Please call [phone]  for any of the following symptoms:   Following upper endoscopy (EGD, EUS, ERCP, esophageal dilation) Vomiting of blood or coffee ground material  New, significant abdominal pain  New, significant chest pain or pain under the shoulder blades  Painful or  persistently difficult swallowing  New shortness of breath  Black, tarry-looking or red, bloody stools  FOLLOW UP:  If any biopsies were taken you will be contacted by phone or by letter within the next 1-3 weeks. Call [phone]  if you have not heard about the biopsies in 3 weeks.  Please also call with any specific questions about appointments or follow up tests.  

## 2020-08-15 ENCOUNTER — Encounter (HOSPITAL_COMMUNITY): Payer: Self-pay | Admitting: Gastroenterology

## 2020-08-15 LAB — SURGICAL PATHOLOGY

## 2020-09-16 DIAGNOSIS — M7989 Other specified soft tissue disorders: Secondary | ICD-10-CM | POA: Diagnosis not present

## 2020-09-16 DIAGNOSIS — J309 Allergic rhinitis, unspecified: Secondary | ICD-10-CM | POA: Diagnosis not present

## 2020-09-16 DIAGNOSIS — I10 Essential (primary) hypertension: Secondary | ICD-10-CM | POA: Diagnosis not present

## 2020-09-16 DIAGNOSIS — Z8679 Personal history of other diseases of the circulatory system: Secondary | ICD-10-CM | POA: Diagnosis not present

## 2020-09-16 DIAGNOSIS — G4700 Insomnia, unspecified: Secondary | ICD-10-CM | POA: Diagnosis not present

## 2020-10-11 ENCOUNTER — Other Ambulatory Visit: Payer: Self-pay

## 2020-10-17 DIAGNOSIS — K760 Fatty (change of) liver, not elsewhere classified: Secondary | ICD-10-CM | POA: Diagnosis not present

## 2020-10-17 DIAGNOSIS — M1990 Unspecified osteoarthritis, unspecified site: Secondary | ICD-10-CM | POA: Diagnosis not present

## 2020-10-17 DIAGNOSIS — K219 Gastro-esophageal reflux disease without esophagitis: Secondary | ICD-10-CM | POA: Diagnosis not present

## 2020-10-17 DIAGNOSIS — Z86711 Personal history of pulmonary embolism: Secondary | ICD-10-CM | POA: Diagnosis not present

## 2020-10-17 DIAGNOSIS — Z86718 Personal history of other venous thrombosis and embolism: Secondary | ICD-10-CM | POA: Diagnosis not present

## 2020-10-17 DIAGNOSIS — E1140 Type 2 diabetes mellitus with diabetic neuropathy, unspecified: Secondary | ICD-10-CM | POA: Diagnosis not present

## 2020-10-17 DIAGNOSIS — G4700 Insomnia, unspecified: Secondary | ICD-10-CM | POA: Diagnosis not present

## 2020-10-17 DIAGNOSIS — I509 Heart failure, unspecified: Secondary | ICD-10-CM | POA: Diagnosis not present

## 2020-10-18 ENCOUNTER — Telehealth: Payer: Self-pay | Admitting: Cardiology

## 2020-10-18 NOTE — Telephone Encounter (Signed)
Pt c/o medication issue:  1. Name of Medication: sacubitril-valsartan (ENTRESTO) 49-51 MG [[id number]]    2. How are you currently taking this medication (dosage and times per day)?  Take 1 tablet by mouth 2 (two) times daily.  3. Are you having a reaction (difficulty breathing--STAT)? No  4. What is your medication issue? Pt called in stated that pharmacy is needing PA done on this med .  Please advise   Best number for pt  [phone]

## 2020-10-18 NOTE — Telephone Encounter (Signed)
Informed patient that I have spoken with the insurance company and this was renewal of his previous PA which just expired so she would be extending the PA for the next year.  I instructed the patient to give it 30-45 minutes before it would be active and go through.  He thanked me for my time and help.

## 2020-11-02 DIAGNOSIS — K219 Gastro-esophageal reflux disease without esophagitis: Secondary | ICD-10-CM | POA: Diagnosis not present

## 2020-11-02 DIAGNOSIS — M25462 Effusion, left knee: Secondary | ICD-10-CM | POA: Diagnosis not present

## 2020-11-02 DIAGNOSIS — K2270 Barrett's esophagus without dysplasia: Secondary | ICD-10-CM | POA: Diagnosis not present

## 2020-11-02 DIAGNOSIS — D500 Iron deficiency anemia secondary to blood loss (chronic): Secondary | ICD-10-CM | POA: Diagnosis not present

## 2020-11-02 DIAGNOSIS — M1712 Unilateral primary osteoarthritis, left knee: Secondary | ICD-10-CM | POA: Diagnosis not present

## 2020-11-03 DIAGNOSIS — D649 Anemia, unspecified: Secondary | ICD-10-CM | POA: Diagnosis not present

## 2020-11-21 DIAGNOSIS — G4700 Insomnia, unspecified: Secondary | ICD-10-CM | POA: Diagnosis not present

## 2020-11-21 DIAGNOSIS — M1710 Unilateral primary osteoarthritis, unspecified knee: Secondary | ICD-10-CM | POA: Diagnosis not present

## 2020-11-21 DIAGNOSIS — E1121 Type 2 diabetes mellitus with diabetic nephropathy: Secondary | ICD-10-CM | POA: Diagnosis not present

## 2020-11-21 DIAGNOSIS — I10 Essential (primary) hypertension: Secondary | ICD-10-CM | POA: Diagnosis not present

## 2020-11-21 DIAGNOSIS — D509 Iron deficiency anemia, unspecified: Secondary | ICD-10-CM | POA: Diagnosis not present

## 2020-11-21 DIAGNOSIS — M25462 Effusion, left knee: Secondary | ICD-10-CM | POA: Diagnosis not present

## 2020-11-21 DIAGNOSIS — E559 Vitamin D deficiency, unspecified: Secondary | ICD-10-CM | POA: Diagnosis not present

## 2020-11-21 DIAGNOSIS — Z79899 Other long term (current) drug therapy: Secondary | ICD-10-CM | POA: Diagnosis not present

## 2020-11-21 DIAGNOSIS — M1712 Unilateral primary osteoarthritis, left knee: Secondary | ICD-10-CM | POA: Diagnosis not present

## 2020-11-25 DIAGNOSIS — E1121 Type 2 diabetes mellitus with diabetic nephropathy: Secondary | ICD-10-CM | POA: Diagnosis not present

## 2020-11-25 DIAGNOSIS — E559 Vitamin D deficiency, unspecified: Secondary | ICD-10-CM | POA: Diagnosis not present

## 2020-11-25 DIAGNOSIS — Z79899 Other long term (current) drug therapy: Secondary | ICD-10-CM | POA: Diagnosis not present

## 2020-12-05 ENCOUNTER — Other Ambulatory Visit: Payer: Self-pay | Admitting: Cardiology

## 2020-12-05 DIAGNOSIS — M1712 Unilateral primary osteoarthritis, left knee: Secondary | ICD-10-CM | POA: Diagnosis not present

## 2020-12-21 DIAGNOSIS — Z8679 Personal history of other diseases of the circulatory system: Secondary | ICD-10-CM | POA: Diagnosis not present

## 2020-12-21 DIAGNOSIS — G4700 Insomnia, unspecified: Secondary | ICD-10-CM | POA: Diagnosis not present

## 2020-12-21 DIAGNOSIS — J029 Acute pharyngitis, unspecified: Secondary | ICD-10-CM | POA: Diagnosis not present

## 2020-12-21 DIAGNOSIS — M7989 Other specified soft tissue disorders: Secondary | ICD-10-CM | POA: Diagnosis not present

## 2020-12-21 DIAGNOSIS — J069 Acute upper respiratory infection, unspecified: Secondary | ICD-10-CM | POA: Diagnosis not present

## 2020-12-21 DIAGNOSIS — I10 Essential (primary) hypertension: Secondary | ICD-10-CM | POA: Diagnosis not present

## 2020-12-25 NOTE — Progress Notes (Signed)
Electrophysiology Office Note   Date:  12/26/2020   ID:  Sean Blouch., DOB 02/28/1951, MRN [id number]  PCP:  Sean Johns, MD  Cardiologist:  [name] Primary Electrophysiologist:  Sukanya Goldblatt Meredith Leeds, MD    Chief Complaint: AF   History of Present Illness: Sean Baylock. is a 70 y.o. male who is being seen today for the evaluation of AF at the request of Sean Johns, MD. Presenting today for electrophysiology evaluation.  He has a history significant for hypertension, hyperlipidemia, CHF, and atrial fibrillation. He had attempted cardioversion 11/05/2019 but unfortunately did not go back into sinus rhythm. Had been having symptoms of weakness and fatigue for 2 months. He also had associated shortness of breath. Echo showed an ejection fraction of 35 to 40%. He is status post AF ablation 12/25/2019. Repeat echo showed normalization of his ejection fraction.  Today, denies symptoms of palpitations, chest pain, shortness of breath, orthopnea, PND, lower extremity edema, claudication, dizziness, presyncope, syncope, bleeding, or neurologic sequela. The patient is tolerating medications without difficulties.  Since last being seen he is doing well.  He has no chest pain or shortness of breath.  Unfortunately his wife was diagnosed with Covid.  She was diagnosed despite 3 negative PCR's.  He fortunately did not contract the virus.   Past Medical History:  Diagnosis Date  . Allergic rhinitis   . Arrhythmia   . Atrial fibrillation ([place])   . Benign prostatic hyperplasia with urinary obstruction 11/29/2018  . CHF (congestive heart failure) ([place])   . Diabetes mellitus due to underlying condition with unspecified complications ([place]) 06/04/5783  . Diabetes mellitus without complication ([place])   . DOE (dyspnea on exertion) 10/29/2019   S/p PE  11/2018 > DOAC recurrent 09/14/2019 off DOAC so resumed  - onset variable doe and orthostatic lightheadedness 06/2019 with afib - PFT's [place]  10/08/2019  :  FEV1 2.79 (73%) with ratio 75 and 6 % better p saba,  Air trapping on lung vol,  ERV 14% and dlco 22.78 (60%) corrects to 3.68 (76%) for vol with minimal concavity to f/v loop  - 10/29/2019   Walked RA x two laps =  approx 555ft @ avg  . Essential hypertension 09/29/2019  . Fissure in skin of foot 12/11/2016  . GERD (gastroesophageal reflux disease) 11/29/2018  . Hemospermia 02/22/2020  . History of pulmonary embolism 09/29/2019  . Hyperlipidemia   . Hypertension   . Hypogonadism in male 02/22/2020  . IBS (irritable bowel syndrome) 11/29/2018  . Impotence 02/22/2020  . Incomplete emptying of bladder 02/22/2020  . Mixed dyslipidemia 09/29/2019  . Morbid obesity ([place]) 09/29/2019  . OSA on CPAP   . Overgrown toenails 12/11/2016  . Persistent atrial fibrillation ([place]) 09/29/2019  . Plantar fasciitis, bilateral 06/05/2016  . Secondary hypercoagulable state ([place]) 01/22/2020  . Upper airway cough syndrome 12/15/2019   Noted on exam 12/15/2019 onset while on Entresto, resolved with purse lip   Past Surgical History:  Procedure Laterality Date  . ATRIAL FIBRILLATION ABLATION N/A 12/25/2019   Procedure: ATRIAL FIBRILLATION ABLATION;  Surgeon: [name], MD;  Location: [facility];  Service: Cardiovascular;  Laterality: N/A;  . BIOPSY  08/11/2020   Procedure: BIOPSY;  Surgeon: [name], MD;  Location: [facility];  Service: Endoscopy;;  . CARDIAC CATHETERIZATION    . ESOPHAGOGASTRODUODENOSCOPY (EGD) WITH PROPOFOL N/A 08/11/2020   Procedure: ESOPHAGOGASTRODUODENOSCOPY (EGD) WITH PROPOFOL;  Surgeon: [name], MD;  Location: [facility];  Service: Endoscopy;  Laterality:  N/A;  . KNEE SURGERY    . PILONIDAL CYST / SINUS EXCISION    . UPPER ESOPHAGEAL ENDOSCOPIC ULTRASOUND (EUS) N/A 08/11/2020   Procedure: UPPER ESOPHAGEAL ENDOSCOPIC ULTRASOUND (EUS);  Surgeon: [name], MD;  Location: [facility];  Service: Endoscopy;  Laterality: N/A;     Current Outpatient  Medications  Medication Sig Dispense Refill  . albuterol (PROVENTIL) (2.5 MG/3ML) 0.083% nebulizer solution Take 2.5 mg by nebulization every 6 (six) hours as needed for wheezing or shortness of breath.     Marland Kitchen albuterol (VENTOLIN HFA) 108 (90 Base) MCG/ACT inhaler Inhale 2 puffs into the lungs every 6 (six) hours as needed for wheezing or shortness of breath.     . allopurinol (ZYLOPRIM) 100 MG tablet Take 100 mg by mouth daily.    . Ascorbic Acid (VITAMIN C) 1000 MG tablet Take 1,000 mg by mouth 2 (two) times daily.    Marland Kitchen atorvastatin (LIPITOR) 20 MG tablet Take 1 tablet (20 mg total) by mouth daily. 90 tablet 3  . B Complex-C (SUPER B COMPLEX PO) Take 1 capsule by mouth daily.     . Cholecalciferol (VITAMIN D3) 50 MCG (2000 UT) TABS Take 2,000 Units by mouth 2 (two) times daily.     Marland Kitchen ELIQUIS 5 MG TABS tablet Take 1 tablet (5 mg total) by mouth 2 (two) times daily. (Patient taking differently: Take 5 mg by mouth 2 (two) times daily.) 180 tablet 1  . empagliflozin (JARDIANCE) 25 MG TABS tablet Take 25 mg by mouth daily.    . eszopiclone (LUNESTA) 2 MG TABS tablet Take 2 mg by mouth at bedtime.     . fluticasone (FLONASE) 50 MCG/ACT nasal spray Place 2 sprays into both nostrils daily as needed for allergies or rhinitis.    . furosemide (LASIX) 40 MG tablet Take 20-40 mg by mouth See admin instructions. Take 40 mg in the morning and 20 mg around 2pm    . hydrALAZINE (APRESOLINE) 50 MG tablet Take 50 mg by mouth every 8 (eight) hours.    . insulin glargine, 1 Unit Dial, (TOUJEO SOLOSTAR) 300 UNIT/ML Solostar Pen Inject 90 Units into the skin at bedtime.    . metoprolol succinate (TOPROL-XL) 50 MG 24 hr tablet Take 1 tablet (50 mg total) by mouth daily. Take with or immediately following a meal. 90 tablet 2  . montelukast (SINGULAIR) 10 MG tablet Take 10 mg by mouth at bedtime.    . Omega-3 Fatty Acids (FISH OIL) 1200 MG CAPS Take 1,200 mg by mouth 2 (two) times daily.     Marland Kitchen omeprazole (PRILOSEC) 40 MG  capsule Take 40 mg by mouth 2 (two) times daily.     Marland Kitchen rOPINIRole (REQUIP) 2 MG tablet Take 2 mg by mouth at bedtime.    . sacubitril-valsartan (ENTRESTO) 49-51 MG Take 1 tablet by mouth 2 (two) times daily. 60 tablet 11  . tamsulosin (FLOMAX) 0.4 MG CAPS capsule Take 0.4 capsules by mouth 2 (two) times daily.    . traMADol (ULTRAM) 50 MG tablet Take 50 mg by mouth daily as needed for moderate pain.     Marland Kitchen venlafaxine XR (EFFEXOR-XR) 150 MG 24 hr capsule Take 150 mg by mouth daily.     . Vitamin D, Ergocalciferol, (DRISDOL) 1.25 MG (50000 UT) CAPS capsule Take 50,000 Units by mouth every Tuesday.      No current facility-administered medications for this visit.    Allergies:   Prednisone and Victoza [liraglutide]   Social History:  The patient  reports that he has never smoked. He has never used smokeless tobacco. He reports previous alcohol use. He reports that he does not use drugs.   Family History:  The patient's family history includes Colon cancer in his father; Hyperlipidemia in his father; Hypertension in his father; Liver cancer in his father; Lung cancer in his father.    ROS:  Please see the history of present illness.   Otherwise, review of systems is positive for none.   All other systems are reviewed and negative.   PHYSICAL EXAM: VS:  BP 136/74   Pulse 67   Ht 6\' 2"  (1.88 m)   Wt (!) 310 lb (140.6 kg)   SpO2 97%   BMI 39.80 kg/m  , BMI Body mass index is 39.8 kg/m. GEN: Well nourished, well developed, in no acute distress  HEENT: normal  Neck: no JVD, carotid bruits, or masses Cardiac: RRR; no murmurs, rubs, or gallops,no edema  Respiratory:  clear to auscultation bilaterally, normal work of breathing GI: soft, nontender, nondistended, + BS MS: no deformity or atrophy  Skin: warm and dry Neuro:  Strength and sensation are intact Psych: euthymic mood, full affect  EKG:  EKG is ordered today. Personal review of the ekg ordered shows sinus rhythm, rate 67  Recent  Labs: 08/05/2020: Hemoglobin 14.1; Platelets 194.0    Lipid Panel     Component Value Date/Time   CHOL 135 10/09/2019 0820   TRIG 159 (H) 10/09/2019 0820   HDL 47 10/09/2019 0820   CHOLHDL 2.9 10/09/2019 0820   LDLCALC 61 10/09/2019 0820     Wt Readings from Last 3 Encounters:  12/26/20 (!) 310 lb (140.6 kg)  08/11/20 300 lb (136.1 kg)  08/05/20 (!) 315 lb 8 oz (143.1 kg)      Other studies Reviewed: Additional studies/ records that were reviewed today include: TTE 03/11/2020 Review of the above records today demonstrates:  1. Left ventricular ejection fraction, by estimation, is 60 to 65%. The  left ventricle has normal function. The left ventricle has no regional  wall motion abnormalities. Left ventricular diastolic parameters are  consistent with Grade I diastolic  dysfunction (impaired relaxation).   ASSESSMENT AND PLAN:  1. Persistent atrial fibrillation: Currently on Eliquis. CHA2DS2-VASc of 3. Status post ablation 12/25/2019.  Has remained in sinus rhythm.  No changes.  2. Chronic systolic heart failure due to nonischemic cardiomyopathy: Ejection fraction 35 to 40%. Currently on Entresto and metoprolol. Ejection fraction normalized since ablation.  No obvious volume overload.  3. Hypertension: Currently well controlled   Current medicines are reviewed at length with the patient today.   The patient does not have concerns regarding his medicines.  The following changes were made today: None  Labs/ tests ordered today include:  No orders of the defined types were placed in this encounter.    Disposition:   FU with Edia Pursifull 6 months  Signed, Lakevia Perris Meredith Leeds, MD  12/26/2020 8:57 AM     [facility] [address] [place] [address] [phone] (office) [phone] (fax)

## 2020-12-26 ENCOUNTER — Other Ambulatory Visit: Payer: Self-pay

## 2020-12-26 ENCOUNTER — Ambulatory Visit (INDEPENDENT_AMBULATORY_CARE_PROVIDER_SITE_OTHER): Payer: Medicare Other | Admitting: Cardiology

## 2020-12-26 ENCOUNTER — Encounter: Payer: Self-pay | Admitting: Cardiology

## 2020-12-26 VITALS — BP 136/74 | HR 67 | Ht 74.0 in | Wt 310.0 lb

## 2020-12-26 DIAGNOSIS — I4819 Other persistent atrial fibrillation: Secondary | ICD-10-CM | POA: Diagnosis not present

## 2020-12-26 NOTE — Patient Instructions (Signed)
Medication Instructions:  Your physician recommends that you continue on your current medications as directed. Please refer to the Current Medication list given to you today.  *If you need a refill on your cardiac medications before your next appointment, please call your pharmacy*   Lab Work: None ordered  Testing/Procedures: None ordered   Follow-Up: At [facility], you and your health needs are our priority.  As part of our continuing mission to provide you with exceptional heart care, we have created designated Provider Care Teams.  These Care Teams include your primary Cardiologist (physician) and Advanced Practice Providers (APPs -  Physician Assistants and Nurse Practitioners) who all work together to provide you with the care you need, when you need it.  Your next appointment:   6 month(s)  The format for your next appointment:   In Person  Provider:   Will Camnitz, MD    Thank you for choosing [facility]!!   Dshaun Reppucci, RN [phone]   Other Instructions     

## 2021-01-04 ENCOUNTER — Other Ambulatory Visit: Payer: Self-pay | Admitting: Cardiology

## 2021-01-17 DIAGNOSIS — Z9181 History of falling: Secondary | ICD-10-CM | POA: Diagnosis not present

## 2021-01-17 DIAGNOSIS — Z0000 Encounter for general adult medical examination without abnormal findings: Secondary | ICD-10-CM | POA: Diagnosis not present

## 2021-01-17 DIAGNOSIS — Z1331 Encounter for screening for depression: Secondary | ICD-10-CM | POA: Diagnosis not present

## 2021-01-17 DIAGNOSIS — E785 Hyperlipidemia, unspecified: Secondary | ICD-10-CM | POA: Diagnosis not present

## 2021-01-17 DIAGNOSIS — Z6831 Body mass index (BMI) 31.0-31.9, adult: Secondary | ICD-10-CM | POA: Diagnosis not present

## 2021-01-18 DIAGNOSIS — G4700 Insomnia, unspecified: Secondary | ICD-10-CM | POA: Diagnosis not present

## 2021-01-18 DIAGNOSIS — I509 Heart failure, unspecified: Secondary | ICD-10-CM | POA: Diagnosis not present

## 2021-01-18 DIAGNOSIS — K219 Gastro-esophageal reflux disease without esophagitis: Secondary | ICD-10-CM | POA: Diagnosis not present

## 2021-01-18 DIAGNOSIS — M1990 Unspecified osteoarthritis, unspecified site: Secondary | ICD-10-CM | POA: Diagnosis not present

## 2021-01-18 DIAGNOSIS — Z86711 Personal history of pulmonary embolism: Secondary | ICD-10-CM | POA: Diagnosis not present

## 2021-01-18 DIAGNOSIS — K760 Fatty (change of) liver, not elsewhere classified: Secondary | ICD-10-CM | POA: Diagnosis not present

## 2021-02-02 ENCOUNTER — Other Ambulatory Visit: Payer: Self-pay | Admitting: Cardiology

## 2021-02-15 DIAGNOSIS — I10 Essential (primary) hypertension: Secondary | ICD-10-CM | POA: Diagnosis not present

## 2021-02-15 DIAGNOSIS — G4700 Insomnia, unspecified: Secondary | ICD-10-CM | POA: Diagnosis not present

## 2021-02-15 DIAGNOSIS — W57XXXA Bitten or stung by nonvenomous insect and other nonvenomous arthropods, initial encounter: Secondary | ICD-10-CM | POA: Diagnosis not present

## 2021-02-15 DIAGNOSIS — D509 Iron deficiency anemia, unspecified: Secondary | ICD-10-CM | POA: Diagnosis not present

## 2021-02-15 DIAGNOSIS — E1121 Type 2 diabetes mellitus with diabetic nephropathy: Secondary | ICD-10-CM | POA: Diagnosis not present

## 2021-02-15 DIAGNOSIS — J309 Allergic rhinitis, unspecified: Secondary | ICD-10-CM | POA: Diagnosis not present

## 2021-03-16 DIAGNOSIS — M25462 Effusion, left knee: Secondary | ICD-10-CM | POA: Diagnosis not present

## 2021-03-16 DIAGNOSIS — M1712 Unilateral primary osteoarthritis, left knee: Secondary | ICD-10-CM | POA: Diagnosis not present

## 2021-03-20 DIAGNOSIS — E559 Vitamin D deficiency, unspecified: Secondary | ICD-10-CM | POA: Diagnosis not present

## 2021-03-20 DIAGNOSIS — M79609 Pain in unspecified limb: Secondary | ICD-10-CM | POA: Diagnosis not present

## 2021-03-20 DIAGNOSIS — Z01818 Encounter for other preprocedural examination: Secondary | ICD-10-CM | POA: Diagnosis not present

## 2021-03-20 DIAGNOSIS — Z79899 Other long term (current) drug therapy: Secondary | ICD-10-CM | POA: Diagnosis not present

## 2021-03-20 DIAGNOSIS — K449 Diaphragmatic hernia without obstruction or gangrene: Secondary | ICD-10-CM | POA: Diagnosis not present

## 2021-03-20 LAB — PROTIME-INR: INR: 1 (ref 0.9–1.1)

## 2021-03-21 ENCOUNTER — Other Ambulatory Visit: Payer: Self-pay | Admitting: Cardiology

## 2021-03-22 ENCOUNTER — Telehealth: Payer: Self-pay | Admitting: *Deleted

## 2021-03-22 NOTE — Telephone Encounter (Signed)
Will route to PharmD for rec's re: holding anticoagulation. Richardson Dopp, PA-C    03/22/2021 5:26 PM

## 2021-03-22 NOTE — Telephone Encounter (Signed)
   [facility] Pre-operative Risk Assessment    Patient Name: Jevin Camino.  DOB: 22-Aug-1951  MRN: [id number]   HEARTCARE STAFF: - Please ensure there is not already an duplicate clearance open for this procedure. - Under Visit Info/Reason for Call, type in Other and utilize the format Clearance MM/DD/YY or Clearance TBD. Do not use dashes or single digits. - If request is for dental extraction, please clarify the # of teeth to be extracted.  Request for surgical clearance:  1. What type of surgery is being performed? LEFT TOTAL KNEE ARTHROPLASTY   2. When is this surgery scheduled? TBD   3. What type of clearance is required (medical clearance vs. Pharmacy clearance to hold med vs. Both)? BOTH  4. Are there any medications that need to be held prior to surgery and how long? ELIQUIS   5. Practice name and name of physician performing surgery? [facility]; DR. Creig Hines  6. What is the office phone number? [phone]   7.   What is the office fax number? [phone]  8.   Anesthesia type (None, local, MAC, general) ? GENERAL   Julaine Hua 03/22/2021, 4:19 PM  _________________________________________________________________   (provider comments below)

## 2021-03-23 NOTE — Telephone Encounter (Signed)
Patient with diagnosis of afib on Eliquis for anticoagulation.    Procedure: LEFT TOTAL KNEE ARTHROPLASTY  Date of procedure: TBD  CHA2DS2-VASc Score = 5  This indicates a 7.2% annual risk of stroke. The patient's score is based upon: CHF History: Yes HTN History: Yes Diabetes History: Yes Stroke History: No Vascular Disease History: Yes Age Score: 1 Gender Score: 0     CrCl 84 ml/min Platelet count 194  Of note patient has a hx of a single PE.  Per office protocol, patient can hold Eliquis for 3 days prior to procedure.    For orthopedic procedures please be sure to resume therapeutic (not prophylactic) dosing.

## 2021-03-23 NOTE — Telephone Encounter (Signed)
    Sean [name]. DOB:  15-Jan-1951  MRN:  [id number]   Primary Cardiologist: [name], MD  Chart reviewed as part of pre-operative protocol coverage. Given past medical history and time since last visit, based on ACC/AHA guidelines, Sean [name]. would be at acceptable risk for the planned procedure without further cardiovascular testing.   Per pharmacy: Procedure: LEFT TOTAL KNEE ARTHROPLASTY Date of procedure: TBD  CHA2DS2-VASc Score = 5  This indicates a 7.2% annual risk of stroke. The patient's score is based upon: CHF History: Yes HTN History: Yes Diabetes History: Yes Stroke History: No Vascular Disease History: Yes Age Score: 1 Gender Score: 0     CrCl 84 ml/min Platelet count 194  Of note patient has a hx of a single PE.  Per office protocol, patient can hold Eliquis for 3 days prior to procedure.    For orthopedic procedures please be sure to resume therapeutic (not prophylactic) dosing.   If he develops new symptoms prior to surgery, contact our office to arrange for a follow-up visit.  I will route this recommendation to the requesting party via Epic fax function and remove from pre-op pool.  Please call with questions.  Christell Faith, PA-C 03/23/2021, 2:52 PM

## 2021-04-03 DIAGNOSIS — E1121 Type 2 diabetes mellitus with diabetic nephropathy: Secondary | ICD-10-CM | POA: Diagnosis not present

## 2021-04-03 DIAGNOSIS — U071 COVID-19: Secondary | ICD-10-CM | POA: Diagnosis not present

## 2021-04-03 DIAGNOSIS — G4700 Insomnia, unspecified: Secondary | ICD-10-CM | POA: Diagnosis not present

## 2021-04-03 DIAGNOSIS — I10 Essential (primary) hypertension: Secondary | ICD-10-CM | POA: Diagnosis not present

## 2021-04-10 DIAGNOSIS — R52 Pain, unspecified: Secondary | ICD-10-CM | POA: Diagnosis not present

## 2021-04-10 DIAGNOSIS — M79609 Pain in unspecified limb: Secondary | ICD-10-CM | POA: Diagnosis not present

## 2021-04-10 DIAGNOSIS — Z79899 Other long term (current) drug therapy: Secondary | ICD-10-CM | POA: Diagnosis not present

## 2021-04-11 ENCOUNTER — Telehealth: Payer: Self-pay | Admitting: Cardiology

## 2021-04-11 ENCOUNTER — Telehealth: Payer: Self-pay | Admitting: Physician Assistant

## 2021-04-11 DIAGNOSIS — Z01818 Encounter for other preprocedural examination: Secondary | ICD-10-CM | POA: Diagnosis not present

## 2021-04-11 DIAGNOSIS — E1121 Type 2 diabetes mellitus with diabetic nephropathy: Secondary | ICD-10-CM | POA: Diagnosis not present

## 2021-04-11 DIAGNOSIS — I10 Essential (primary) hypertension: Secondary | ICD-10-CM | POA: Diagnosis not present

## 2021-04-11 DIAGNOSIS — I4891 Unspecified atrial fibrillation: Secondary | ICD-10-CM | POA: Diagnosis not present

## 2021-04-11 NOTE — Telephone Encounter (Signed)
    STAT if HR is under 50 or over 120 (normal HR is 60-100 beats per minute)  What is your heart rate? 138/75 HR 148 and fluctuating   Do you have a log of your heart rate readings (document readings)?   Do you have any other symptoms? Pt feels like he is back on Afib, and was advised by Dr. Rica Records to call Dr. Curt Bears to make an appt with him, soonest available is 06/27 with APP but he said he needs to be seen sooner that that

## 2021-04-11 NOTE — Telephone Encounter (Signed)
Patient was diagnosed with COVID on June 6.  He reports,he is completely recovered.  However noted temperature of 99.9 this afternoon.  He denies fever feeling, cough, congestion, sore throat, runny nose or headache.  He was found to be tachycardic and hypertensive during PCP evaluation today for his upcoming knee surgery.  He was instructed to take Toprol XL 25 mg by nurse.  See separate telephone encounter.  Patient was calling to give update. His heart rates remained elevated in 140s.  Blood pressure 126/88.  Patient is asymptomatic.  He denies chest pain, shortness of breath, palpitation, orthopnea, PND, dizziness or lower extremity edema.  I have advised to take another Toprol-XL 25 mg right now.  He will take total 100 mg of Toprol-XL in the morning.  He has office visit in [place] on 6/16.  Previously patient had a cardioversion but unsuccessful requiring ablation.  Suspect patient may need another cardioversion attempt or antiarrhythmic treatment.  Likely triggered by COVID.  Fortunately, he is asymptomatic.  I have instructed not to miss any single dose of Eliquis.  He will call us tomorrow after 8 AM for further instruction.  If he becomes symptomatic, he will come to [facility] ER for further evaluation.  May try to get appointment in A. fib clinic tomorrow.  Patient is agree with plan and appreciative of call.

## 2021-04-11 NOTE — Telephone Encounter (Signed)
I called the pt and informed him that the clearance was faxed on May 25,2022. Pt states that Simmie Davies stated that clearance was faxed wrong and we need to re-send. I assured the pt that I will reach out to [facility] and confirm fax#. Pt thanked me for the call and the help.  I then called and s/w Simmie Davies with Dr. Towanda Octave office. Confirmed fax #'s that I had. Discovered the fax # on the clearance request back in May was wrong. I did state I faxed clearance today to fax# [phone]. Ivin Booty asked if I could please fax to [phone]. I will re-fax to [phone].

## 2021-04-11 NOTE — Telephone Encounter (Signed)
Re-faxed to two separate fax numbers today. Looks like this was initially sent 03/22/21 and then re faxed again several days later.

## 2021-04-11 NOTE — Telephone Encounter (Signed)
Pt states he seen his PCP today and they monitored him due to elevated BP and HR.  BP came back down but HR remained in the 140s-150s.  They advised pt to go home and call our office.  Pt denies palps, lightheadedness or dizziness.  Does have some SOB.  Checked vitals while on the phone with me and they were 138/95, HR 145.  Advised pt to take another half dose of his Metoprolol, check vitals again in 1-2 hours and call the on call if HR still above 100 so they could give him some guidance.  Scheduled pt first available appt I could find with one of our offices which is Thursday in [place] with [place].  Provided address and phone number to that office.  Pt wanted to avoid ED.  Will also route to AF clinic to see if there is any way they can get the pt in tomorrow for eval.

## 2021-04-11 NOTE — Telephone Encounter (Signed)
OUR OFFICE RECEIVED A DUPLICATE CLEARANCE REQUEST FOR PT. CLEARANCE WAS FAXED BACK ON  Mar 22, 2021. I WILL RE-FAX CLEARANCE NOTES TO SURGEON'S OFFICE FAX # [phone]

## 2021-04-11 NOTE — Telephone Encounter (Signed)
See phone encounter.

## 2021-04-11 NOTE — Telephone Encounter (Signed)
Follow Up:      Pt is calling for his surgeon's office. They say they still have not received his clearance. Any questions please call Simmie Davies at [phone].

## 2021-04-11 NOTE — Telephone Encounter (Signed)
I called the Sean [name] and informed him that the clearance was faxed on May 25,2022. Sean [name] states that Sean [name] stated that clearance was faxed wrong and we need to re-send. I assured the Sean [name] that I will reach out to [facility] and confirm fax#. Sean [name] thanked me for the call and the help.  I then called and s/w Sean [name] with Dr. Towanda Octave office. Confirmed fax #'s that I had. Discovered the fax # on the clearance request back in May was wrong. I did state I faxed clearance today to fax# [phone]. Ivin Booty asked if I could please fax to [phone]. I will re-fax to [phone].

## 2021-04-12 ENCOUNTER — Other Ambulatory Visit: Payer: Self-pay

## 2021-04-12 ENCOUNTER — Emergency Department (HOSPITAL_COMMUNITY)
Admission: EM | Admit: 2021-04-12 | Discharge: 2021-04-12 | Disposition: A | Discharge status: Home / Self Care | Payer: Medicare Other | Attending: Emergency Medicine | Admitting: Emergency Medicine

## 2021-04-12 ENCOUNTER — Other Ambulatory Visit (HOSPITAL_COMMUNITY): Payer: Self-pay | Admitting: *Deleted

## 2021-04-12 ENCOUNTER — Encounter (HOSPITAL_COMMUNITY): Payer: Self-pay | Admitting: Pharmacy Technician

## 2021-04-12 ENCOUNTER — Emergency Department (HOSPITAL_COMMUNITY): Payer: Medicare Other

## 2021-04-12 DIAGNOSIS — I509 Heart failure, unspecified: Secondary | ICD-10-CM | POA: Diagnosis not present

## 2021-04-12 DIAGNOSIS — Z79899 Other long term (current) drug therapy: Secondary | ICD-10-CM | POA: Diagnosis not present

## 2021-04-12 DIAGNOSIS — E119 Type 2 diabetes mellitus without complications: Secondary | ICD-10-CM | POA: Diagnosis not present

## 2021-04-12 DIAGNOSIS — I110 Hypertensive heart disease with heart failure: Secondary | ICD-10-CM | POA: Diagnosis not present

## 2021-04-12 DIAGNOSIS — I4891 Unspecified atrial fibrillation: Secondary | ICD-10-CM | POA: Insufficient documentation

## 2021-04-12 DIAGNOSIS — Z7901 Long term (current) use of anticoagulants: Secondary | ICD-10-CM | POA: Insufficient documentation

## 2021-04-12 DIAGNOSIS — R000 Tachycardia, unspecified: Secondary | ICD-10-CM | POA: Insufficient documentation

## 2021-04-12 DIAGNOSIS — Z794 Long term (current) use of insulin: Secondary | ICD-10-CM | POA: Diagnosis not present

## 2021-04-12 DIAGNOSIS — Z8616 Personal history of COVID-19: Secondary | ICD-10-CM | POA: Diagnosis not present

## 2021-04-12 DIAGNOSIS — R0602 Shortness of breath: Secondary | ICD-10-CM | POA: Diagnosis not present

## 2021-04-12 DIAGNOSIS — Z7952 Long term (current) use of systemic steroids: Secondary | ICD-10-CM | POA: Diagnosis not present

## 2021-04-12 LAB — CBC WITH DIFFERENTIAL/PLATELET
Abs Immature Granulocytes: 0.01 10*3/uL (ref 0.00–0.07)
Basophils Absolute: 0.1 10*3/uL (ref 0.0–0.1)
Basophils Relative: 1 %
Eosinophils Absolute: 0.1 10*3/uL (ref 0.0–0.5)
Eosinophils Relative: 2 %
HCT: 42.8 % (ref 39.0–52.0)
Hemoglobin: 13.3 g/dL (ref 13.0–17.0)
Immature Granulocytes: 0 %
Lymphocytes Relative: 28 %
Lymphs Abs: 1.7 10*3/uL (ref 0.7–4.0)
MCH: 29.8 pg (ref 26.0–34.0)
MCHC: 31.1 g/dL (ref 30.0–36.0)
MCV: 95.7 fL (ref 80.0–100.0)
Monocytes Absolute: 0.5 10*3/uL (ref 0.1–1.0)
Monocytes Relative: 9 %
Neutro Abs: 3.6 10*3/uL (ref 1.7–7.7)
Neutrophils Relative %: 60 %
Platelets: 228 10*3/uL (ref 150–400)
RBC: 4.47 MIL/uL (ref 4.22–5.81)
RDW: 15.6 % — ABNORMAL HIGH (ref 11.5–15.5)
WBC: 6 10*3/uL (ref 4.0–10.5)
nRBC: 0 % (ref 0.0–0.2)

## 2021-04-12 LAB — BASIC METABOLIC PANEL
Anion gap: 7 (ref 5–15)
BUN: 12 mg/dL (ref 8–23)
CO2: 26 mmol/L (ref 22–32)
Calcium: 8.6 mg/dL — ABNORMAL LOW (ref 8.9–10.3)
Chloride: 104 mmol/L (ref 98–111)
Creatinine, Ser: 1.13 mg/dL (ref 0.61–1.24)
GFR, Estimated: >60 mL/min (ref >60)
Glucose, Bld: 217 mg/dL — ABNORMAL HIGH (ref 70–99)
Potassium: 4 mmol/L (ref 3.5–5.1)
Sodium: 137 mmol/L (ref 135–145)

## 2021-04-12 LAB — TROPONIN I (HIGH SENSITIVITY)
Troponin I (High Sensitivity): 9 ng/L (ref <18)
Troponin I (High Sensitivity): 9 ng/L (ref <18)

## 2021-04-12 LAB — BRAIN NATRIURETIC PEPTIDE: B Natriuretic Peptide: 219.3 pg/mL — ABNORMAL HIGH (ref 0.0–100.0)

## 2021-04-12 MED ORDER — DILTIAZEM HCL 25 MG/5ML IV SOLN
10.0000 mg | Freq: Once | INTRAVENOUS | Status: AC
  Administered 2021-04-12: 10 mg via INTRAVENOUS
  Filled 2021-04-12: qty 5

## 2021-04-12 MED ORDER — METOPROLOL TARTRATE 5 MG/5ML IV SOLN
5.0000 mg | Freq: Once | INTRAVENOUS | Status: AC
  Administered 2021-04-12: 5 mg via INTRAVENOUS
  Filled 2021-04-12: qty 5

## 2021-04-12 MED ORDER — DILTIAZEM HCL ER COATED BEADS 120 MG PO CP24: 120.0000 mg | ORAL_CAPSULE | Freq: Every day | ORAL | 0 refills | Status: DC

## 2021-04-12 MED ORDER — DILTIAZEM HCL ER COATED BEADS 120 MG PO CP24
120.0000 mg | ORAL_CAPSULE | Freq: Once | ORAL | Status: AC
  Administered 2021-04-12: 120 mg via ORAL
  Filled 2021-04-12: qty 1

## 2021-04-12 MED ORDER — DILTIAZEM HCL 60 MG PO TABS
60.0000 mg | ORAL_TABLET | Freq: Once | ORAL | Status: DC
  Filled 2021-04-12: qty 1

## 2021-04-12 MED ORDER — FUROSEMIDE 10 MG/ML IJ SOLN
40.0000 mg | Freq: Once | INTRAMUSCULAR | Status: AC
  Administered 2021-04-12: 40 mg via INTRAVENOUS
  Filled 2021-04-12: qty 4

## 2021-04-12 NOTE — ED Triage Notes (Signed)
Pt here with reports of dizziness and shob onset yesterday. Pt dx with covid on 6/6. Pt in afib rate of 125 in triage, currently on eliquis.

## 2021-04-12 NOTE — ED Provider Notes (Signed)
[facility] EMERGENCY DEPARTMENT Provider Note   CSN: [id number] Arrival date & time: 04/12/21  6333     History Chief Complaint  Patient presents with   Dizziness   Shortness of Breath    Sean [name]. is a 70 y.o. male.  HPI Reports he has history of atrial fibrillation.  He is compliant with Eliquis and Toprol.  He reports he did develop COVID and was diagnosed 6\6, treated with Paxlovid.  He reports his symptoms have improved significantly.  He has been feeling well got a little lightheaded and short of breath yesterday.  Patient was checked heart rate and it was elevated up to the 130s and greater.  He contacted his cardiologist group and was advised to take an additional half dose of Toprol last night and also a 100 mg dose this morning.  (At baseline patient takes 50 mg daily).  Patient reports that it did not help a lot and his heart rate still remained elevated with mild feeling of shortness of breath.  Patient denies any chest pain.  He reports COVID symptoms have nearly resolved and he is no longer having coughing, no body ache or general fatigue.  No significant increased swelling of the legs.  Patient reports that he does have Lasix prescribed but he hates that and he rarely takes it.    Past Medical History:  Diagnosis Date   Allergic rhinitis    Arrhythmia    Atrial fibrillation ([place])    Benign prostatic hyperplasia with urinary obstruction 11/29/2018   CHF (congestive heart failure) ([place])    Diabetes mellitus due to underlying condition with unspecified complications ([place]) 54/02/6255   Diabetes mellitus without complication (HCC)    DOE (dyspnea on exertion) 10/29/2019   S/p PE  11/2018 > DOAC recurrent 09/14/2019 off DOAC so resumed  - onset variable doe and orthostatic lightheadedness 06/2019 with afib - PFT's [place]  10/08/2019 :  FEV1 2.79 (73%) with ratio 75 and 6 % better p saba,  Air trapping on lung vol,  ERV 14% and dlco 22.78 (60%) corrects  to 3.68 (76%) for vol with minimal concavity to f/v loop  - 10/29/2019   Walked RA x two laps =  approx 557ft @ avg   Essential hypertension 09/29/2019   Fissure in skin of foot 12/11/2016   GERD (gastroesophageal reflux disease) 11/29/2018   Hemospermia 02/22/2020   History of pulmonary embolism 09/29/2019   Hyperlipidemia    Hypertension    Hypogonadism in male 02/22/2020   IBS (irritable bowel syndrome) 11/29/2018   Impotence 02/22/2020   Incomplete emptying of bladder 02/22/2020   Mixed dyslipidemia 09/29/2019   Morbid obesity ([place]) 09/29/2019   OSA on CPAP    Overgrown toenails 12/11/2016   Persistent atrial fibrillation ([place]) 09/29/2019   Plantar fasciitis, bilateral 06/05/2016   Secondary hypercoagulable state ([place]) 01/22/2020   Upper airway cough syndrome 12/15/2019   Noted on exam 12/15/2019 onset while on Entresto, resolved with purse lip    Patient Active Problem List   Diagnosis Date Noted   Melena 08/03/2020   Barrett's esophagus 38/93/7342   Nonalcoholic fatty liver disease 08/03/2020   History of colonic polyps 08/03/2020   Submucosal lesion of stomach 08/03/2020   PAF (paroxysmal atrial fibrillation) ([place]) 02/23/2020   Cardiomyopathy ([place]) 02/23/2020   S/P ablation of atrial fibrillation 02/23/2020   Hypogonadism in male 02/22/2020   Impotence 02/22/2020   Hemospermia 02/22/2020   Incomplete emptying of bladder 02/22/2020   Secondary hypercoagulable  state ([place]) 01/22/2020   Upper airway cough syndrome 12/15/2019   DOE (dyspnea on exertion) 10/29/2019   Essential hypertension 09/29/2019   Mixed dyslipidemia 09/29/2019   Diabetes mellitus due to underlying condition with unspecified complications ([place]) 63/84/6659   History of pulmonary embolism 09/29/2019   Morbid obesity ([place]) 09/29/2019   Benign prostatic hyperplasia with urinary obstruction 11/29/2018   GERD (gastroesophageal reflux disease) 11/29/2018   IBS (irritable bowel syndrome) 11/29/2018   Fissure in skin of foot  12/11/2016   Overgrown toenails 12/11/2016   Plantar fasciitis, bilateral 06/05/2016    Past Surgical History:  Procedure Laterality Date   ATRIAL FIBRILLATION ABLATION N/A 12/25/2019   Procedure: ATRIAL FIBRILLATION ABLATION;  Surgeon: [name], MD;  Location: [place] [facility];  Service: Cardiovascular;  Laterality: N/A;   BIOPSY  08/11/2020   Procedure: BIOPSY;  Surgeon: [name], MD;  Location: [facility];  Service: Endoscopy;;   CARDIAC CATHETERIZATION     ESOPHAGOGASTRODUODENOSCOPY (EGD) WITH PROPOFOL N/A 08/11/2020   Procedure: ESOPHAGOGASTRODUODENOSCOPY (EGD) WITH PROPOFOL;  Surgeon: [name], MD;  Location: [facility];  Service: Endoscopy;  Laterality: N/A;   KNEE SURGERY     PILONIDAL CYST / SINUS EXCISION     UPPER ESOPHAGEAL ENDOSCOPIC ULTRASOUND (EUS) N/A 08/11/2020   Procedure: UPPER ESOPHAGEAL ENDOSCOPIC ULTRASOUND (EUS);  Surgeon: [name], MD;  Location: [facility];  Service: Endoscopy;  Laterality: N/A;       Family History  Problem Relation Age of Onset   Hyperlipidemia Father    Hypertension Father    Colon cancer Father    Liver cancer Father    Lung cancer Father     Social History   Tobacco Use   Smoking status: Never   Smokeless tobacco: Never  Vaping Use   Vaping Use: Never used  Substance Use Topics   Alcohol use: Not Currently   Drug use: Never    Home Medications Prior to Admission medications   Medication Sig Start Date End Date Taking? Authorizing Provider  diltiazem (CARDIZEM CD) 120 MG 24 hr capsule Take 1 capsule (120 mg total) by mouth daily. 04/12/21  Yes Charlesetta Shanks, MD  albuterol (PROVENTIL) (2.5 MG/3ML) 0.083% nebulizer solution Take 2.5 mg by nebulization every 6 (six) hours as needed for wheezing or shortness of breath.     [provider]  albuterol (VENTOLIN HFA) 108 (90 Base) MCG/ACT inhaler Inhale 2 puffs into the lungs every 6 (six) hours as needed for wheezing or  shortness of breath.     [provider]  allopurinol (ZYLOPRIM) 100 MG tablet Take 100 mg by mouth daily.    [provider]  Ascorbic Acid (VITAMIN C) 1000 MG tablet Take 1,000 mg by mouth 2 (two) times daily.    [provider]  atorvastatin (LIPITOR) 20 MG tablet Take 1 tablet (20 mg total) by mouth daily. Patient needs appointment for further refills. 1 st attempt 03/21/21 04/20/21  [name], Reita Cliche, MD  B Complex-C (SUPER B COMPLEX PO) Take 1 capsule by mouth daily.     [provider]  Cholecalciferol (VITAMIN D3) 50 MCG (2000 UT) TABS Take 2,000 Units by mouth 2 (two) times daily.     [provider]  ELIQUIS 5 MG TABS tablet Take 1 tablet (5 mg total) by mouth 2 (two) times daily. 01/04/21   [name], Reita Cliche, MD  empagliflozin (JARDIANCE) 25 MG TABS tablet Take 25 mg by mouth daily.    [provider]  ENTRESTO 49-51 MG Take 1 tablet by mouth 2 (two) times daily. 02/02/21   [name], Reita Cliche, MD  eszopiclone (LUNESTA) 2 MG TABS tablet Take 2 mg by mouth at bedtime.  12/07/19   [provider]  fluticasone (FLONASE) 50 MCG/ACT nasal spray Place 2 sprays into both nostrils daily as needed for allergies or rhinitis.    [provider]  furosemide (LASIX) 40 MG tablet Take 20-40 mg by mouth See admin instructions. Take 40 mg in the morning and 20 mg around 2pm 12/15/19   [provider]  hydrALAZINE (APRESOLINE) 50 MG tablet Take 50 mg by mouth every 8 (eight) hours. 02/10/20   [provider]  insulin glargine, 1 Unit Dial, (TOUJEO SOLOSTAR) 300 UNIT/ML Solostar Pen Inject 90 Units into the skin at bedtime.    [provider]  metoprolol succinate (TOPROL-XL) 50 MG 24 hr tablet Take 1 tablet (50 mg total) by mouth daily. Take with or immediately following a meal. 12/07/20   Camnitz, Ocie Doyne, MD  montelukast (SINGULAIR) 10 MG tablet Take 10 mg by mouth at bedtime.    [provider]  Omega-3  Fatty Acids (FISH OIL) 1200 MG CAPS Take 1,200 mg by mouth 2 (two) times daily.     [provider]  omeprazole (PRILOSEC) 40 MG capsule Take 40 mg by mouth 2 (two) times daily.     [provider]  rOPINIRole (REQUIP) 2 MG tablet Take 2 mg by mouth at bedtime.    [provider]  tamsulosin (FLOMAX) 0.4 MG CAPS capsule Take 0.4 capsules by mouth 2 (two) times daily.    [provider]  traMADol (ULTRAM) 50 MG tablet Take 50 mg by mouth daily as needed for moderate pain.  02/10/20   [provider]  venlafaxine XR (EFFEXOR-XR) 150 MG 24 hr capsule Take 150 mg by mouth daily.     [provider]  Vitamin D, Ergocalciferol, (DRISDOL) 1.25 MG (50000 UT) CAPS capsule Take 50,000 Units by mouth every Tuesday.     [provider]    Allergies    Prednisone and Victoza [liraglutide]  Review of Systems   Review of Systems 10 systems reviewed and negative except as per HPI Physical Exam Updated Vital Signs BP (!) 120/107 (BP Location: Right Arm)   Pulse (!) 122   Temp 98.1 F (36.7 C) (Oral)   Resp 19   Ht 6\' 2"  (1.88 m)   Wt 136.1 kg   SpO2 96%   BMI 38.52 kg/m   Physical Exam Constitutional:      Comments: Alert nontoxic.  Clear mental status.  No respiratory distress.  HENT:     Mouth/Throat:     Pharynx: Oropharynx is clear.  Cardiovascular:     Comments: Tachycardia, irregularly irregular. Pulmonary:     Effort: Pulmonary effort is normal.     Breath sounds: Normal breath sounds.  Abdominal:     General: There is no distension.     Palpations: Abdomen is soft.     Tenderness: There is no abdominal tenderness. There is no guarding.  Musculoskeletal:        General: No swelling or tenderness. Normal range of motion.     Right lower leg: No edema.     Left lower leg: No edema.  Skin:    General: Skin is warm and dry.  Neurological:     General: No focal deficit present.     Mental Status: He is  oriented to  person, place, and time.     Coordination: Coordination normal.    ED Results / Procedures / Treatments   Labs (all labs ordered are listed, but only abnormal results are displayed) Labs Reviewed  BASIC METABOLIC PANEL - Abnormal; Notable for the following components:      Result Value   Glucose, Bld 217 (*)    Calcium 8.6 (*)    All other components within normal limits  CBC WITH DIFFERENTIAL/PLATELET - Abnormal; Notable for the following components:   RDW 15.6 (*)    All other components within normal limits  BRAIN NATRIURETIC PEPTIDE - Abnormal; Notable for the following components:   B Natriuretic Peptide 219.3 (*)    All other components within normal limits  TROPONIN I (HIGH SENSITIVITY)  TROPONIN I (HIGH SENSITIVITY)    EKG EKG Interpretation  Date/Time:  Wednesday April 12 2021 10:15:55 EDT Ventricular Rate:  127 PR Interval:    QRS Duration: 92 QT Interval:  328 QTC Calculation: 476 R Axis:   30 Text Interpretation: Atrial flutter with variable A-V block Cannot rule out Anterior infarct , age undetermined Abnormal ECG atrial flutter since previous tracing, QRS and T wave morphology unchanges Confirmed by Charlesetta Shanks [phone]) on 04/12/2021 10:36:48 AM  Radiology DG Chest Port 1 View  Result Date: 04/12/2021 CLINICAL DATA:  Shortness of breath. EXAM: PORTABLE CHEST 1 VIEW COMPARISON:  January 18, 2021 FINDINGS: Cardiomediastinal silhouette is normal. Mediastinal contours appear intact. There is no evidence of focal airspace consolidation, pleural effusion or pneumothorax. Possible mild pulmonary vascular congestion. Osseous structures are without acute abnormality. Soft tissues are grossly normal. IMPRESSION: Possible mild pulmonary vascular congestion. Electronically Signed   By: Fidela Salisbury M.D.   On: 04/12/2021 11:00    Procedures Procedures  CRITICAL CARE Performed by: Charlesetta Shanks   Total critical care time: 30 minutes  Critical care time was  exclusive of separately billable procedures and treating other patients.  Critical care was necessary to treat or prevent imminent or life-threatening deterioration.  Critical care was time spent personally by me on the following activities: development of treatment plan with patient and/or surrogate as well as nursing, discussions with consultants, evaluation of patient's response to treatment, examination of patient, obtaining history from patient or surrogate, ordering and performing treatments and interventions, ordering and review of laboratory studies, ordering and review of radiographic studies, pulse oximetry and re-evaluation of patient's condition.  Medications Ordered in ED Medications  diltiazem (CARDIZEM) injection 10 mg (10 mg Intravenous Given 04/12/21 1251)  diltiazem (CARDIZEM) injection 10 mg (10 mg Intravenous Given 04/12/21 1324)  metoprolol tartrate (LOPRESSOR) injection 5 mg (5 mg Intravenous Given 04/12/21 1413)  furosemide (LASIX) injection 40 mg (40 mg Intravenous Given 04/12/21 1413)  diltiazem (CARDIZEM CD) 24 hr capsule 120 mg (120 mg Oral Given 04/12/21 1602)    ED Course  I have reviewed the triage vital signs and the nursing notes.  Pertinent labs & imaging results that were available during my care of the patient were reviewed by me and considered in my medical decision making (see chart for details).  Clinical Course as of 04/12/21 1611  Wed Apr 12, 2021  1522 Recheck: Patient is up at bedside.  He has slight elevation in heart rate up to about 108.  No respiratory distress.  Feeling improved.  Vital signs show heart rate is trending into the 80s now when at rest. [MP]    Clinical Course User Index [MP] Charlesetta Shanks, MD  MDM Rules/Calculators/A&P                          Consult: Reviewed with Dr. Gwenlyn Found cardiology.  Recommends rate control and if adequately rate controlled can follow-up outpatient in the office.  He does not think there is utility in  attempting cardioversion as the patient has failed cardioversion in the past and is unclear how often or how chronically the patient is in A. fib since he is asymptomatic.  Patient presents as outlined above.  He is alert and nontoxic.  No respiratory distress.  Chest x-ray shows some possible vascular congestion.  Rate control initiated with Cardizem and 10 mg boluses due to slightly soft blood pressure.  Blood pressures have normalized and heart rate improved.  Patient however still had heart rate up to 100, Lopressor and oral Cardizem 120 mg ordered.  Patient rechecked and alert no distress.  He is diuresing well.  Heart rates are consistently staying in the 80s at rest now.  Plan has been established for care for the rest the day.  Patient does have a follow-up tomorrow afternoon with cardiology.  Return precautions reviewed. Final Clinical Impression(s) / ED Diagnoses Final diagnoses:  Atrial fibrillation with rapid ventricular response ([place])    Rx / DC Orders ED Discharge Orders          Ordered    diltiazem (CARDIZEM CD) 120 MG 24 hr capsule  Daily        04/12/21 1600             Charlesetta Shanks, MD 04/12/21 1615

## 2021-04-12 NOTE — Discharge Instructions (Addendum)
1.  You will need to monitor your blood pressure and heart rate closely at home.  At this time, you are going to make medication adjustments according to your heart rate and blood pressure as follows.  This evening, if your heart rate remains greater than 70, take a dose of Toprol 50 mg.  If your heart rate is 100 or greater, also take a dose of Cardizem 120 mg CD.  If your blood pressure is less than 140s over 90s, do not take your nighttime hydralazine dose.  You do not want your blood pressure to go too low as you are now adding additional blood pressure medications with an extra dose of Toprol and a new medication called Cardizem.  Tomorrow morning, as long as your heart rate is 70s or greater, you may take a 100 mg dose of your Toprol.  If your heart rate is 90 or greater, also take the Cardizem as prescribed in the morning.  Again, if your blood pressures are normal or low, do not take the hydralazine.  Take an additional Lasix dose of 20 mg at about 6 PM this evening.  Take a dose of Lasix 20 mg tomorrow morning.  See your cardiology provider tomorrow afternoon as planned.  Take a log containing your heart rate measurements and blood pressure measurements as well as the times and doses of the medications you have taken.  Stay out of the heat today.  If it anytime, your blood pressure or heart rate is low and you feel lightheaded or dizzy, do not take an additional dose of medication.  Lie down with your legs elevated, repeat your vital signs and if they remain low or you are symptomatic, return to the emergency department for recheck.  Return immediately if you develop chest pain, increasing shortness of breath or other concerning symptoms.

## 2021-04-12 NOTE — Telephone Encounter (Signed)
Patient was calling back like instructed by Bhagat to speak to Dr Baird Kay nurse but they were in a meeting. Transfer to [facility] but they werent willing to take the call. Let the patient know that I will send a note back to make Sherri to let her know and soon as she get a chance she will give him a call back. Please advise.

## 2021-04-12 NOTE — H&P (View-Only) (Signed)
Cardiology Office Note:    Date:  04/13/2021   ID:  Sean [name]., DOB 03-20-1951, MRN [id number]  PCP:  [name], MD  [facility] Cardiologist:  Will Meredith Leeds, MD  [facility] Electrophysiologist:  Will Meredith Leeds, MD   Referring MD: [name], MD   Chief Complaint: surgical clearance  History of Present Illness:    Sean [name]. is a 70 y.o. male with a hx of DM2, h/o pulmonary embolism, HTN, HLD, CHF, afib s/p ablation 2/26 on Eliquis.   He had attempted cardioversion 11/05/19 but unfortunately did not go back into SR. He had been having weakness and fatigue for 2 months. Also had SOB. Echo showed EF 35-40%. He underwent AF ablation 12/25/19. Repeat echo showed normalization of EF.   Saw Dr. Curt Bears 12/26/20 and was in [place] on Eliquis. CHADSVASC of 3. On Entresto and metoprolol.   Planning on left knee replacement, no scheduled yet. He was diagnosed with COVID June 6. Had low grade temperature 6/14. At PCP visit for upcoming knee surgery he was hypertensive and tachycardic. He was instructed to take Toprol XL 25mg . Heart rate elevated to the 140s with BP 126/88, he was asymptomatic.   Went to the ER 6/15 for dizzines and sob. Extra dose of toprol did not improve heart rate. CXR with minimal vascular congestion. Ekg with aflutter, 127bpm. He was given Lopressor and oral cardizem 120mg . Heart rates went down into the 80s.   Today, EKG shows Afib/flutter with heart rate in 76bpm. The patient is completely asymptomatic. He has not chest pain, shortness of breath, lightheadedness ,dizziness. Trace left LLE. No orthopnea, pnd. BP very low at 82/58. RE-check confirmed hypotension. He did not take hydralazine today. He takes Entresto, Toprol and cardizem. Also taking Lasix 20mg  daily given mild volume overload by ER work-up, he was previously taking as needed. He has not missed doses of Eliquis.   Patient is functional, able to walk 1-2 blocks and up a flight of stairs.  No smoking history. No alcohol or drug use. He has a vacation in 2 weekends, will need to work around vacation. He was planning on knee surgery after that, this will have to wait.  Past Medical History:  Diagnosis Date   Allergic rhinitis    Arrhythmia    Atrial fibrillation ([place])    Benign prostatic hyperplasia with urinary obstruction 11/29/2018   CHF (congestive heart failure) ([place])    Diabetes mellitus due to underlying condition with unspecified complications ([place]) 28/04/6810   Diabetes mellitus without complication (HCC)    DOE (dyspnea on exertion) 10/29/2019   S/p PE  11/2018 > DOAC recurrent 09/14/2019 off DOAC so resumed  - onset variable doe and orthostatic lightheadedness 06/2019 with afib - PFT's [place]  10/08/2019 :  FEV1 2.79 (73%) with ratio 75 and 6 % better p saba,  Air trapping on lung vol,  ERV 14% and dlco 22.78 (60%) corrects to 3.68 (76%) for vol with minimal concavity to f/v loop  - 10/29/2019   Walked RA x two laps =  approx 560ft @ avg   Essential hypertension 09/29/2019   Fissure in skin of foot 12/11/2016   GERD (gastroesophageal reflux disease) 11/29/2018   Hemospermia 02/22/2020   History of pulmonary embolism 09/29/2019   Hyperlipidemia    Hypertension    Hypogonadism in male 02/22/2020   IBS (irritable bowel syndrome) 11/29/2018   Impotence 02/22/2020   Incomplete emptying of bladder 02/22/2020   Mixed dyslipidemia 09/29/2019  Morbid obesity ([place]) 09/29/2019   OSA on CPAP    Overgrown toenails 12/11/2016   Persistent atrial fibrillation ([place]) 09/29/2019   Plantar fasciitis, bilateral 06/05/2016   Secondary hypercoagulable state ([place]) 01/22/2020   Upper airway cough syndrome 12/15/2019   Noted on exam 12/15/2019 onset while on Entresto, resolved with purse lip    Past Surgical History:  Procedure Laterality Date   ATRIAL FIBRILLATION ABLATION N/A 12/25/2019   Procedure: ATRIAL FIBRILLATION ABLATION;  Surgeon: [name], MD;  Location: [facility];   Service: Cardiovascular;  Laterality: N/A;   BIOPSY  08/11/2020   Procedure: BIOPSY;  Surgeon: [name], MD;  Location: [facility];  Service: Endoscopy;;   CARDIAC CATHETERIZATION     ESOPHAGOGASTRODUODENOSCOPY (EGD) WITH PROPOFOL N/A 08/11/2020   Procedure: ESOPHAGOGASTRODUODENOSCOPY (EGD) WITH PROPOFOL;  Surgeon: [name], MD;  Location: [facility];  Service: Endoscopy;  Laterality: N/A;   KNEE SURGERY     PILONIDAL CYST / SINUS EXCISION     UPPER ESOPHAGEAL ENDOSCOPIC ULTRASOUND (EUS) N/A 08/11/2020   Procedure: UPPER ESOPHAGEAL ENDOSCOPIC ULTRASOUND (EUS);  Surgeon: [name], MD;  Location: [facility];  Service: Endoscopy;  Laterality: N/A;    Current Medications: Current Meds  Medication Sig   albuterol (PROVENTIL) (2.5 MG/3ML) 0.083% nebulizer solution Take 2.5 mg by nebulization every 6 (six) hours as needed for wheezing or shortness of breath.    albuterol (VENTOLIN HFA) 108 (90 Base) MCG/ACT inhaler Inhale 2 puffs into the lungs every 6 (six) hours as needed for wheezing or shortness of breath.    allopurinol (ZYLOPRIM) 100 MG tablet Take 100 mg by mouth daily.   Ascorbic Acid (VITAMIN C) 1000 MG tablet Take 1,000 mg by mouth 2 (two) times daily.   atorvastatin (LIPITOR) 20 MG tablet Take 1 tablet (20 mg total) by mouth daily. Patient needs appointment for further refills. 1 st attempt   B Complex-C (SUPER B COMPLEX PO) Take 1 capsule by mouth daily.    Cholecalciferol (VITAMIN D3) 50 MCG (2000 UT) TABS Take 2,000 Units by mouth 2 (two) times daily.    diltiazem (CARDIZEM CD) 120 MG 24 hr capsule Take 1 capsule (120 mg total) by mouth daily.   ELIQUIS 5 MG TABS tablet Take 1 tablet (5 mg total) by mouth 2 (two) times daily.   empagliflozin (JARDIANCE) 25 MG TABS tablet Take 25 mg by mouth daily.   eszopiclone (LUNESTA) 2 MG TABS tablet Take 2 mg by mouth at bedtime.    fluticasone (FLONASE) 50 MCG/ACT nasal spray Place 2 sprays into both nostrils daily  as needed for allergies or rhinitis.   furosemide (LASIX) 40 MG tablet Take 20-40 mg by mouth See admin instructions. Take 40 mg in the morning and 20 mg around 2pm   insulin glargine, 1 Unit Dial, (TOUJEO SOLOSTAR) 300 UNIT/ML Solostar Pen Inject 90 Units into the skin at bedtime.   insulin lispro (HUMALOG) 100 UNIT/ML injection Inject 0-10 Units into the skin 3 (three) times daily as needed for high blood sugar. Sliding Scale   metoprolol succinate (TOPROL-XL) 50 MG 24 hr tablet Take 1 tablet (50 mg total) by mouth daily. Take with or immediately following a meal.   montelukast (SINGULAIR) 10 MG tablet Take 10 mg by mouth at bedtime.   Omega-3 Fatty Acids (FISH OIL) 1200 MG CAPS Take 1,200 mg by mouth 2 (two) times daily.    omeprazole (PRILOSEC) 40 MG capsule Take 40 mg by mouth 2 (two) times  daily.    rOPINIRole (REQUIP) 2 MG tablet Take 2 mg by mouth at bedtime.   sacubitril-valsartan (ENTRESTO) 24-26 MG Take 1 tablet by mouth 2 (two) times daily.   tamsulosin (FLOMAX) 0.4 MG CAPS capsule Take 0.4 capsules by mouth 2 (two) times daily.   traMADol (ULTRAM) 50 MG tablet Take 50 mg by mouth daily as needed for moderate pain.    venlafaxine XR (EFFEXOR-XR) 150 MG 24 hr capsule Take 150 mg by mouth daily.    Vitamin D, Ergocalciferol, (DRISDOL) 1.25 MG (50000 UT) CAPS capsule Take 50,000 Units by mouth every Tuesday.    [DISCONTINUED] ENTRESTO 49-51 MG Take 1 tablet by mouth 2 (two) times daily.   [DISCONTINUED] hydrALAZINE (APRESOLINE) 50 MG tablet Take 50 mg by mouth every 8 (eight) hours.     Allergies:   Prednisone and Victoza [liraglutide]   Social History   Socioeconomic History   Marital status: Married    Spouse name: Not on file   Number of children: Not on file   Years of education: Not on file   Highest education level: Not on file  Occupational History   Not on file  Tobacco Use   Smoking status: Never   Smokeless tobacco: Never  Vaping Use   Vaping Use: Never used   Substance and Sexual Activity   Alcohol use: Not Currently   Drug use: Never   Sexual activity: Not on file  Other Topics Concern   Not on file  Social History Narrative   Not on file   Social Determinants of Health   Financial Resource Strain: Not on file  Food Insecurity: Not on file  Transportation Needs: Not on file  Physical Activity: Not on file  Stress: Not on file  Social Connections: Not on file     Family History: The patient's family history includes Colon cancer in his father; Hyperlipidemia in his father; Hypertension in his father; Liver cancer in his father; Lung cancer in his father.  ROS:   Please see the history of present illness.     All other systems reviewed and are negative.  EKGs/Labs/Other Studies Reviewed:    The following studies were reviewed today:  Echo 03/11/20 1. Left ventricular ejection fraction, by estimation, is 60 to 65%. The  left ventricle has normal function. The left ventricle has no regional  wall motion abnormalities. Left ventricular diastolic parameters are  consistent with Grade I diastolic  dysfunction (impaired relaxation).   Myoview stress test 09/2019   There was no ST segment deviation noted during stress. No T wave inversion was noted during stress. This is a non-gated study due to atrial fibrillation. The study is normal. This is a low risk study.   EKG:  EKG is ordered today.  The ekg ordered today demonstrates Aflutter with variable block, 76bpm,   Recent Labs: 04/12/2021: B Natriuretic Peptide 219.3; BUN 12; Creatinine, Ser 1.13; Hemoglobin 13.3; Platelets 228; Potassium 4.0; Sodium 137  Recent Lipid Panel    Component Value Date/Time   CHOL 135 10/09/2019 0820   TRIG 159 (H) 10/09/2019 0820   HDL 47 10/09/2019 0820   CHOLHDL 2.9 10/09/2019 0820   LDLCALC 61 10/09/2019 0820    Physical Exam:    VS:  BP (!) 82/58   Pulse 76   Ht 6\' 2"  (1.88 m)   Wt (!) 307 lb (139.3 kg)   BMI 39.42 kg/m     Wt  Readings from Last 3 Encounters:  04/13/21 (!) 307  lb (139.3 kg)  04/12/21 300 lb (136.1 kg)  12/26/20 (!) 310 lb (140.6 kg)     GEN:  Well nourished, well developed in no acute distress HEENT: Normal NECK: No JVD; No carotid bruits LYMPHATICS: No lymphadenopathy CARDIAC: Irregular, no murmurs, rubs, gallops RESPIRATORY:  Clear to auscultation without rales, wheezing or rhonchi  ABDOMEN: Soft, non-tender, non-distended MUSCULOSKELETAL:  Trace left lower extremity edema; No deformity  SKIN: Warm and dry NEUROLOGIC:  Alert and oriented x 3 PSYCHIATRIC:  Normal affect   ASSESSMENT:    1. Persistent atrial fibrillation ([place])   2. S/P ablation of atrial fibrillation   3. Pre-procedure lab exam   4. Atrial flutter, unspecified type ([place])   5. Cardiomyopathy, unspecified type ([place])   6. Nonischemic cardiomyopathy (HCC)    PLAN:    In order of problems listed above:  Persistent Afib/flutter s/p failed DCCV and ablation 12/24/20 Patient diagnosed with COVID 04/03/21 and this possibly triggered aflutter. He was noted to be tachycardic and hypertensive in PCPs office. ER visit confirmed Aflutter with rates in the 120s. Rates improved with Lopressor and diltiazem. Today he remains in aflutter with controlled rates, although hypotensive. He remains completely asymptomatic. Decrease Entresto to 24-26mg  daily. Continue diltiazem and Toprol for rate control. He denies missing Eliquis doses. Update Mag and TSH today. We will set him up for repeat DCCV. He will need to see EP for Aflutter ablation vs AAA. Also mildly volume overloaded due to arrhythmia, taking lasix 20mg  daily.   HFrEF with improved EF NICM Mildly volume up with BNP 219 and CXR with mild CHF. He's taking lasix 20mg  daily. Patient reports weight today of 307lbs is around dry weight. He can continue lasix 20mg  for 2-3 days and then go back to PRN lasix. He will need to keep close watch on his weight given he is back in afib/flutter.    Hypotension H/o hypertension Bps very low after addition of diltiazem. He denies lightheadedness or dizziness. Decrease Entresto to 24-26mg  daily. Continue toprol and cardizem. Stop hydralazine.   Cardiac pre-operative valuation for L knee replacement Patient was planning on elective left knee surgery in July. I am unable to clear patient at this time for surgery given recurrent afib with mild volume overload. We can see him back in 1-2 months or after he sees EP to reassess. Patient voiced his understanding.   Disposition: Follow up in 1-2 months/after EP with MD/APP   Shared Decision Making/Informed Consent   Shared Decision Making/Informed Consent The risks (stroke, cardiac arrhythmias rarely resulting in the need for a temporary or permanent pacemaker, skin irritation or burns and complications associated with conscious sedation including aspiration, arrhythmia, respiratory failure and death), benefits (restoration of normal sinus rhythm) and alternatives of a direct current cardioversion were explained in detail to Mr. Cuffee and he agrees to proceed.      Signed, Merlin Ege Ninfa Meeker, PA-C  04/13/2021 3:50 PM    [facility]

## 2021-04-12 NOTE — Telephone Encounter (Signed)
Called to offer appt for this afternoon in af clinic - pt states he is actually on his way to the emergency room as he is dizzy, short of breath and his HR is 140s SBP is 106. Pt didn't think he could wait until this afternoon. Appt made for follow up ER visit. Will let Dr. Curt Bears office know.

## 2021-04-12 NOTE — ED Provider Notes (Signed)
Emergency Medicine Provider Triage Evaluation Note  Sean [name]. , a 70 y.o. male  was evaluated in triage.  Pt complains of feeling dizzy and short of breath since last night, history of COVID Monday of last week with cough, congestion, shortness of breath.  History of A. fib in February 2020, cardioverted and ablated at that time, compliant with Eliquis.  COVID was treated with Paxlovid last week and felt significant improvement.  Review of Systems  Positive: SHOB, dizzy   Negative: CP  Physical Exam  There were no vitals taken for this visit. Gen:   Awake, no distress   Resp:  Normal effort  MSK:   Moves extremities without difficulty  Other:  Irregular rate and rhythm, not in distress  Medical Decision Making  Medically screening exam initiated at 10:11 AM.  Appropriate orders placed.  Sean [name]. was informed that the remainder of the evaluation will be completed by another provider, this initial triage assessment does not replace that evaluation, and the importance of remaining in the ED until their evaluation is complete.  Found to be hypotensive, in A. fib with rate in the 120s.   Sean Learn, PA-C 04/12/21 1021    Sean Shanks, MD 04/23/21 1231

## 2021-04-12 NOTE — Progress Notes (Signed)
Cardiology Office Note:    Date:  04/13/2021   ID:  Sean Prude., DOB 13-Apr-1951, MRN [id number]  PCP:  Sean Johns, MD  [facility] Cardiologist:  Will Meredith Leeds, MD  [facility] Electrophysiologist:  Will Meredith Leeds, MD   Referring MD: Sean Johns, MD   Chief Complaint: surgical clearance  History of Present Illness:    Sean Blaisdell. is a 70 y.o. male with a hx of DM2, h/o pulmonary embolism, HTN, HLD, CHF, afib s/p ablation 2/26 on Eliquis.   He had attempted cardioversion 11/05/19 but unfortunately did not go back into SR. He had been having weakness and fatigue for 2 months. Also had SOB. Echo showed EF 35-40%. He underwent AF ablation 12/25/19. Repeat echo showed normalization of EF.   Saw Dr. Curt Bears 12/26/20 and was in [place] on Eliquis. CHADSVASC of 3. On Entresto and metoprolol.   Planning on left knee replacement, no scheduled yet. He was diagnosed with COVID June 6. Had low grade temperature 6/14. At PCP visit for upcoming knee surgery he was hypertensive and tachycardic. He was instructed to take Toprol XL 25mg . Heart rate elevated to the 140s with BP 126/88, he was asymptomatic.   Went to the ER 6/15 for dizzines and sob. Extra dose of toprol did not improve heart rate. CXR with minimal vascular congestion. Ekg with aflutter, 127bpm. He was given Lopressor and oral cardizem 120mg . Heart rates went down into the 80s.   Today, EKG shows Afib/flutter with heart rate in 76bpm. The patient is completely asymptomatic. He has not chest pain, shortness of breath, lightheadedness ,dizziness. Trace left LLE. No orthopnea, pnd. BP very low at 82/58. RE-check confirmed hypotension. He did not take hydralazine today. He takes Entresto, Toprol and cardizem. Also taking Lasix 20mg  daily given mild volume overload by ER work-up, he was previously taking as needed. He has not missed doses of Eliquis.   Patient is functional, able to walk 1-2 blocks and up a flight of stairs.  No smoking history. No alcohol or drug use. He has a vacation in 2 weekends, will need to work around vacation. He was planning on knee surgery after that, this will have to wait.  Past Medical History:  Diagnosis Date   Allergic rhinitis    Arrhythmia    Atrial fibrillation ([place])    Benign prostatic hyperplasia with urinary obstruction 11/29/2018   CHF (congestive heart failure) ([place])    Diabetes mellitus due to underlying condition with unspecified complications ([place]) 56/05/1274   Diabetes mellitus without complication (HCC)    DOE (dyspnea on exertion) 10/29/2019   S/p PE  11/2018 > DOAC recurrent 09/14/2019 off DOAC so resumed  - onset variable doe and orthostatic lightheadedness 06/2019 with afib - PFT's Oval Linsey  10/08/2019 :  FEV1 2.79 (73%) with ratio 75 and 6 % better p saba,  Air trapping on lung vol,  ERV 14% and dlco 22.78 (60%) corrects to 3.68 (76%) for vol with minimal concavity to f/v loop  - 10/29/2019   Walked RA x two laps =  approx 527ft @ avg   Essential hypertension 09/29/2019   Fissure in skin of foot 12/11/2016   GERD (gastroesophageal reflux disease) 11/29/2018   Hemospermia 02/22/2020   History of pulmonary embolism 09/29/2019   Hyperlipidemia    Hypertension    Hypogonadism in male 02/22/2020   IBS (irritable bowel syndrome) 11/29/2018   Impotence 02/22/2020   Incomplete emptying of bladder 02/22/2020   Mixed dyslipidemia 09/29/2019  Morbid obesity ([place]) 09/29/2019   OSA on CPAP    Overgrown toenails 12/11/2016   Persistent atrial fibrillation ([place]) 09/29/2019   Plantar fasciitis, bilateral 06/05/2016   Secondary hypercoagulable state ([place]) 01/22/2020   Upper airway cough syndrome 12/15/2019   Noted on exam 12/15/2019 onset while on Entresto, resolved with purse lip    Past Surgical History:  Procedure Laterality Date   ATRIAL FIBRILLATION ABLATION N/A 12/25/2019   Procedure: ATRIAL FIBRILLATION ABLATION;  Surgeon: [name], MD;  Location: [facility];   Service: Cardiovascular;  Laterality: N/A;   BIOPSY  08/11/2020   Procedure: BIOPSY;  Surgeon: [name], MD;  Location: [facility];  Service: Endoscopy;;   CARDIAC CATHETERIZATION     ESOPHAGOGASTRODUODENOSCOPY (EGD) WITH PROPOFOL N/A 08/11/2020   Procedure: ESOPHAGOGASTRODUODENOSCOPY (EGD) WITH PROPOFOL;  Surgeon: [name], MD;  Location: [facility];  Service: Endoscopy;  Laterality: N/A;   KNEE SURGERY     PILONIDAL CYST / SINUS EXCISION     UPPER ESOPHAGEAL ENDOSCOPIC ULTRASOUND (EUS) N/A 08/11/2020   Procedure: UPPER ESOPHAGEAL ENDOSCOPIC ULTRASOUND (EUS);  Surgeon: [name], MD;  Location: [facility];  Service: Endoscopy;  Laterality: N/A;    Current Medications: Current Meds  Medication Sig   albuterol (PROVENTIL) (2.5 MG/3ML) 0.083% nebulizer solution Take 2.5 mg by nebulization every 6 (six) hours as needed for wheezing or shortness of breath.    albuterol (VENTOLIN HFA) 108 (90 Base) MCG/ACT inhaler Inhale 2 puffs into the lungs every 6 (six) hours as needed for wheezing or shortness of breath.    allopurinol (ZYLOPRIM) 100 MG tablet Take 100 mg by mouth daily.   Ascorbic Acid (VITAMIN C) 1000 MG tablet Take 1,000 mg by mouth 2 (two) times daily.   atorvastatin (LIPITOR) 20 MG tablet Take 1 tablet (20 mg total) by mouth daily. Patient needs appointment for further refills. 1 st attempt   B Complex-C (SUPER B COMPLEX PO) Take 1 capsule by mouth daily.    Cholecalciferol (VITAMIN D3) 50 MCG (2000 UT) TABS Take 2,000 Units by mouth 2 (two) times daily.    diltiazem (CARDIZEM CD) 120 MG 24 hr capsule Take 1 capsule (120 mg total) by mouth daily.   ELIQUIS 5 MG TABS tablet Take 1 tablet (5 mg total) by mouth 2 (two) times daily.   empagliflozin (JARDIANCE) 25 MG TABS tablet Take 25 mg by mouth daily.   eszopiclone (LUNESTA) 2 MG TABS tablet Take 2 mg by mouth at bedtime.    fluticasone (FLONASE) 50 MCG/ACT nasal spray Place 2 sprays into both nostrils daily  as needed for allergies or rhinitis.   furosemide (LASIX) 40 MG tablet Take 20-40 mg by mouth See admin instructions. Take 40 mg in the morning and 20 mg around 2pm   insulin glargine, 1 Unit Dial, (TOUJEO SOLOSTAR) 300 UNIT/ML Solostar Pen Inject 90 Units into the skin at bedtime.   insulin lispro (HUMALOG) 100 UNIT/ML injection Inject 0-10 Units into the skin 3 (three) times daily as needed for high blood sugar. Sliding Scale   metoprolol succinate (TOPROL-XL) 50 MG 24 hr tablet Take 1 tablet (50 mg total) by mouth daily. Take with or immediately following a meal.   montelukast (SINGULAIR) 10 MG tablet Take 10 mg by mouth at bedtime.   Omega-3 Fatty Acids (FISH OIL) 1200 MG CAPS Take 1,200 mg by mouth 2 (two) times daily.    omeprazole (PRILOSEC) 40 MG capsule Take 40 mg by mouth 2 (two) times  daily.    rOPINIRole (REQUIP) 2 MG tablet Take 2 mg by mouth at bedtime.   sacubitril-valsartan (ENTRESTO) 24-26 MG Take 1 tablet by mouth 2 (two) times daily.   tamsulosin (FLOMAX) 0.4 MG CAPS capsule Take 0.4 capsules by mouth 2 (two) times daily.   traMADol (ULTRAM) 50 MG tablet Take 50 mg by mouth daily as needed for moderate pain.    venlafaxine XR (EFFEXOR-XR) 150 MG 24 hr capsule Take 150 mg by mouth daily.    Vitamin D, Ergocalciferol, (DRISDOL) 1.25 MG (50000 UT) CAPS capsule Take 50,000 Units by mouth every Tuesday.    [DISCONTINUED] ENTRESTO 49-51 MG Take 1 tablet by mouth 2 (two) times daily.   [DISCONTINUED] hydrALAZINE (APRESOLINE) 50 MG tablet Take 50 mg by mouth every 8 (eight) hours.     Allergies:   Prednisone and Victoza [liraglutide]   Social History   Socioeconomic History   Marital status: Married    Spouse name: Not on file   Number of children: Not on file   Years of education: Not on file   Highest education level: Not on file  Occupational History   Not on file  Tobacco Use   Smoking status: Never   Smokeless tobacco: Never  Vaping Use   Vaping Use: Never used   Substance and Sexual Activity   Alcohol use: Not Currently   Drug use: Never   Sexual activity: Not on file  Other Topics Concern   Not on file  Social History Narrative   Not on file   Social Determinants of Health   Financial Resource Strain: Not on file  Food Insecurity: Not on file  Transportation Needs: Not on file  Physical Activity: Not on file  Stress: Not on file  Social Connections: Not on file     Family History: The patient's family history includes Colon cancer in his father; Hyperlipidemia in his father; Hypertension in his father; Liver cancer in his father; Lung cancer in his father.  ROS:   Please see the history of present illness.     All other systems reviewed and are negative.  EKGs/Labs/Other Studies Reviewed:    The following studies were reviewed today:  Echo 03/11/20 1. Left ventricular ejection fraction, by estimation, is 60 to 65%. The  left ventricle has normal function. The left ventricle has no regional  wall motion abnormalities. Left ventricular diastolic parameters are  consistent with Grade I diastolic  dysfunction (impaired relaxation).   Myoview stress test 09/2019   There was no ST segment deviation noted during stress. No T wave inversion was noted during stress. This is a non-gated study due to atrial fibrillation. The study is normal. This is a low risk study.   EKG:  EKG is ordered today.  The ekg ordered today demonstrates Aflutter with variable block, 76bpm,   Recent Labs: 04/12/2021: B Natriuretic Peptide 219.3; BUN 12; Creatinine, Ser 1.13; Hemoglobin 13.3; Platelets 228; Potassium 4.0; Sodium 137  Recent Lipid Panel    Component Value Date/Time   CHOL 135 10/09/2019 0820   TRIG 159 (H) 10/09/2019 0820   HDL 47 10/09/2019 0820   CHOLHDL 2.9 10/09/2019 0820   LDLCALC 61 10/09/2019 0820    Physical Exam:    VS:  BP (!) 82/58   Pulse 76   Ht 6\' 2"  (1.88 m)   Wt (!) 307 lb (139.3 kg)   BMI 39.42 kg/m     Wt  Readings from Last 3 Encounters:  04/13/21 (!) 307  lb (139.3 kg)  04/12/21 300 lb (136.1 kg)  12/26/20 (!) 310 lb (140.6 kg)     GEN:  Well nourished, well developed in no acute distress HEENT: Normal NECK: No JVD; No carotid bruits LYMPHATICS: No lymphadenopathy CARDIAC: Irregular, no murmurs, rubs, gallops RESPIRATORY:  Clear to auscultation without rales, wheezing or rhonchi  ABDOMEN: Soft, non-tender, non-distended MUSCULOSKELETAL:  Trace left lower extremity edema; No deformity  SKIN: Warm and dry NEUROLOGIC:  Alert and oriented x 3 PSYCHIATRIC:  Normal affect   ASSESSMENT:    1. Persistent atrial fibrillation ([place])   2. S/P ablation of atrial fibrillation   3. Pre-procedure lab exam   4. Atrial flutter, unspecified type ([place])   5. Cardiomyopathy, unspecified type ([place])   6. Nonischemic cardiomyopathy (HCC)    PLAN:    In order of problems listed above:  Persistent Afib/flutter s/p failed DCCV and ablation 12/24/20 Patient diagnosed with COVID 04/03/21 and this possibly triggered aflutter. He was noted to be tachycardic and hypertensive in PCPs office. ER visit confirmed Aflutter with rates in the 120s. Rates improved with Lopressor and diltiazem. Today he remains in aflutter with controlled rates, although hypotensive. He remains completely asymptomatic. Decrease Entresto to 24-26mg  daily. Continue diltiazem and Toprol for rate control. He denies missing Eliquis doses. Update Mag and TSH today. We will set him up for repeat DCCV. He will need to see EP for Aflutter ablation vs AAA. Also mildly volume overloaded due to arrhythmia, taking lasix 20mg  daily.   HFrEF with improved EF NICM Mildly volume up with BNP 219 and CXR with mild CHF. He's taking lasix 20mg  daily. Patient reports weight today of 307lbs is around dry weight. He can continue lasix 20mg  for 2-3 days and then go back to PRN lasix. He will need to keep close watch on his weight given he is back in afib/flutter.    Hypotension H/o hypertension Bps very low after addition of diltiazem. He denies lightheadedness or dizziness. Decrease Entresto to 24-26mg  daily. Continue toprol and cardizem. Stop hydralazine.   Cardiac pre-operative valuation for L knee replacement Patient was planning on elective left knee surgery in July. I am unable to clear patient at this time for surgery given recurrent afib with mild volume overload. We can see him back in 1-2 months or after he sees EP to reassess. Patient voiced his understanding.   Disposition: Follow up in 1-2 months/after EP with MD/APP   Shared Decision Making/Informed Consent   Shared Decision Making/Informed Consent The risks (stroke, cardiac arrhythmias rarely resulting in the need for a temporary or permanent pacemaker, skin irritation or burns and complications associated with conscious sedation including aspiration, arrhythmia, respiratory failure and death), benefits (restoration of normal sinus rhythm) and alternatives of a direct current cardioversion were explained in detail to Mr. Grillo and he agrees to proceed.      Signed, Sean Yepes Ninfa Meeker, PA-C  04/13/2021 3:50 PM    [facility]

## 2021-04-13 ENCOUNTER — Encounter: Payer: Self-pay | Admitting: Medical

## 2021-04-13 ENCOUNTER — Ambulatory Visit (INDEPENDENT_AMBULATORY_CARE_PROVIDER_SITE_OTHER): Payer: Medicare Other | Admitting: Medical

## 2021-04-13 VITALS — BP 82/58 | HR 76 | Ht 74.0 in | Wt 307.0 lb

## 2021-04-13 DIAGNOSIS — I429 Cardiomyopathy, unspecified: Secondary | ICD-10-CM

## 2021-04-13 DIAGNOSIS — I4892 Unspecified atrial flutter: Secondary | ICD-10-CM

## 2021-04-13 DIAGNOSIS — Z9889 Other specified postprocedural states: Secondary | ICD-10-CM | POA: Diagnosis not present

## 2021-04-13 DIAGNOSIS — Z8679 Personal history of other diseases of the circulatory system: Secondary | ICD-10-CM

## 2021-04-13 DIAGNOSIS — I4819 Other persistent atrial fibrillation: Secondary | ICD-10-CM

## 2021-04-13 DIAGNOSIS — Z01810 Encounter for preprocedural cardiovascular examination: Secondary | ICD-10-CM

## 2021-04-13 DIAGNOSIS — Z01812 Encounter for preprocedural laboratory examination: Secondary | ICD-10-CM | POA: Diagnosis not present

## 2021-04-13 DIAGNOSIS — I428 Other cardiomyopathies: Secondary | ICD-10-CM | POA: Diagnosis not present

## 2021-04-13 MED ORDER — ENTRESTO 24-26 MG PO TABS: 1.0000 | ORAL_TABLET | Freq: Two times a day (BID) | ORAL | 11 refills | Status: DC

## 2021-04-13 NOTE — Patient Instructions (Addendum)
Medication Instructions:  Your physician has recommended you make the following change in your medication:   DECREASE Entresto to 24-26 mg twice a day STOP hydralazine  *If you need a refill on your cardiac medications before your next appointment, please call your pharmacy*   Lab Work: TSH, MAG, BMET, and CBC today  If you have labs (blood work) drawn today and your tests are completely normal, you will receive your results only by: [place] (if you have MyChart) OR A paper copy in the mail If you have any lab test that is abnormal or we need to change your treatment, we will call you to review the results.   Testing/Procedures: You are scheduled for a Cardioversion on Monday 6/20 with Dr. Saunders Revel Please arrive at the [facility] at 06:30 a.m. on the day of your procedure.  DIET INSTRUCTIONS:  Nothing to eat or drink after midnight except your medications with a small sip of water.         Medications:  HOLD Furosemide the morning of your cardioversion. ONLY take 45 units the night before of your Toujeo. YOU MAY TAKE ALL of your other medications with a small amount of water.  Must have a responsible person to drive you home.  Bring a current list of your medications and current insurance cards.    If you have any questions after you get home, please call the office at [phone]    Follow-Up: At [facility], you and your health needs are our priority.  As part of our continuing mission to provide you with exceptional heart care, we have created designated Provider Care Teams.  These Care Teams include your primary Cardiologist (physician) and Advanced Practice Providers (APPs -  Physician Assistants and Nurse Practitioners) who all work together to provide you with the care you need, when you need it.   Your next appointment:   1 week(s) (Patient needs follow up on Friday June 24 th due to going out of town.)  The format for your next appointment:   In  Person  Provider:   Dr. Allegra Lai

## 2021-04-14 LAB — CBC
Hematocrit: 40.9 % (ref 37.5–51.0)
Hemoglobin: 13.7 g/dL (ref 13.0–17.7)
MCH: 30 pg (ref 26.6–33.0)
MCHC: 33.5 g/dL (ref 31.5–35.7)
MCV: 90 fL (ref 79–97)
Platelets: 219 10*3/uL (ref 150–450)
RBC: 4.57 x10E6/uL (ref 4.14–5.80)
RDW: 14.6 % (ref 11.6–15.4)
WBC: 7.3 10*3/uL (ref 3.4–10.8)

## 2021-04-14 LAB — BASIC METABOLIC PANEL
BUN/Creatinine Ratio: 12 (ref 10–24)
BUN: 16 mg/dL (ref 8–27)
CO2: 22 mmol/L (ref 20–29)
Calcium: 9.3 mg/dL (ref 8.6–10.2)
Chloride: 104 mmol/L (ref 96–106)
Creatinine, Ser: 1.29 mg/dL — ABNORMAL HIGH (ref 0.76–1.27)
Glucose: 122 mg/dL — ABNORMAL HIGH (ref 65–99)
Potassium: 4.7 mmol/L (ref 3.5–5.2)
Sodium: 143 mmol/L (ref 134–144)
eGFR: 59 mL/min/{1.73_m2} — ABNORMAL LOW (ref >59)

## 2021-04-14 LAB — TSH: TSH: 1.09 u[IU]/mL (ref 0.450–4.500)

## 2021-04-14 LAB — MAGNESIUM: Magnesium: 2.2 mg/dL (ref 1.6–2.3)

## 2021-04-17 ENCOUNTER — Telehealth: Payer: Self-pay | Admitting: *Deleted

## 2021-04-17 ENCOUNTER — Ambulatory Visit: Payer: Medicare Other | Admitting: Anesthesiology

## 2021-04-17 ENCOUNTER — Encounter: Payer: Self-pay | Admitting: Internal Medicine

## 2021-04-17 ENCOUNTER — Encounter
Admission: RE | Disposition: A | Discharge status: Home / Self Care | Payer: Self-pay | Source: Home / Self Care | Attending: Internal Medicine

## 2021-04-17 ENCOUNTER — Ambulatory Visit
Admission: RE | Admit: 2021-04-17 | Discharge: 2021-04-17 | Disposition: A | Discharge status: Home / Self Care | Payer: Medicare Other | Attending: Internal Medicine | Admitting: Internal Medicine

## 2021-04-17 DIAGNOSIS — Z8249 Family history of ischemic heart disease and other diseases of the circulatory system: Secondary | ICD-10-CM | POA: Diagnosis not present

## 2021-04-17 DIAGNOSIS — E782 Mixed hyperlipidemia: Secondary | ICD-10-CM | POA: Insufficient documentation

## 2021-04-17 DIAGNOSIS — Z7951 Long term (current) use of inhaled steroids: Secondary | ICD-10-CM | POA: Insufficient documentation

## 2021-04-17 DIAGNOSIS — E785 Hyperlipidemia, unspecified: Secondary | ICD-10-CM | POA: Diagnosis not present

## 2021-04-17 DIAGNOSIS — I4819 Other persistent atrial fibrillation: Secondary | ICD-10-CM | POA: Insufficient documentation

## 2021-04-17 DIAGNOSIS — Z794 Long term (current) use of insulin: Secondary | ICD-10-CM | POA: Insufficient documentation

## 2021-04-17 DIAGNOSIS — Z888 Allergy status to other drugs, medicaments and biological substances status: Secondary | ICD-10-CM | POA: Insufficient documentation

## 2021-04-17 DIAGNOSIS — Z8616 Personal history of COVID-19: Secondary | ICD-10-CM | POA: Insufficient documentation

## 2021-04-17 DIAGNOSIS — E119 Type 2 diabetes mellitus without complications: Secondary | ICD-10-CM | POA: Insufficient documentation

## 2021-04-17 DIAGNOSIS — Z79899 Other long term (current) drug therapy: Secondary | ICD-10-CM | POA: Diagnosis not present

## 2021-04-17 DIAGNOSIS — Z800 Family history of malignant neoplasm of digestive organs: Secondary | ICD-10-CM | POA: Insufficient documentation

## 2021-04-17 DIAGNOSIS — I4891 Unspecified atrial fibrillation: Secondary | ICD-10-CM | POA: Diagnosis not present

## 2021-04-17 DIAGNOSIS — I110 Hypertensive heart disease with heart failure: Secondary | ICD-10-CM | POA: Diagnosis not present

## 2021-04-17 DIAGNOSIS — Z86711 Personal history of pulmonary embolism: Secondary | ICD-10-CM | POA: Diagnosis not present

## 2021-04-17 DIAGNOSIS — Z7901 Long term (current) use of anticoagulants: Secondary | ICD-10-CM | POA: Diagnosis not present

## 2021-04-17 DIAGNOSIS — I509 Heart failure, unspecified: Secondary | ICD-10-CM | POA: Diagnosis not present

## 2021-04-17 DIAGNOSIS — I480 Paroxysmal atrial fibrillation: Secondary | ICD-10-CM

## 2021-04-17 DIAGNOSIS — I4892 Unspecified atrial flutter: Secondary | ICD-10-CM | POA: Diagnosis not present

## 2021-04-17 DIAGNOSIS — Z801 Family history of malignant neoplasm of trachea, bronchus and lung: Secondary | ICD-10-CM | POA: Insufficient documentation

## 2021-04-17 HISTORY — PX: CARDIOVERSION: SHX1299

## 2021-04-17 SURGERY — CARDIOVERSION
Anesthesia: General

## 2021-04-17 MED ORDER — PROPOFOL 10 MG/ML IV BOLUS
INTRAVENOUS | Status: DC | PRN
  Administered 2021-04-17: 70 mg via INTRAVENOUS

## 2021-04-17 MED ORDER — PROPOFOL 10 MG/ML IV BOLUS
INTRAVENOUS | Status: AC
  Filled 2021-04-17: qty 20

## 2021-04-17 MED ORDER — SODIUM CHLORIDE 0.9 % IV SOLN: INTRAVENOUS | Status: DC | PRN

## 2021-04-17 NOTE — Transfer of Care (Signed)
Immediate Anesthesia Transfer of Care Note  Patient: Sean [name].  Procedure(s) Performed: CARDIOVERSION  Patient Location: Short Stay  Anesthesia Type:General  Level of Consciousness: awake  Airway & Oxygen Therapy: Patient Spontanous Breathing and Patient connected to nasal cannula oxygen  Post-op Assessment: Report given to RN and Post -op Vital signs reviewed and stable  Post vital signs: Reviewed and stable  Last Vitals:  Vitals Value Taken Time  BP 92/63 04/17/21 0738  Temp    Pulse 72 04/17/21 0740  Resp    SpO2 96 % 04/17/21 0740  Vitals shown include unvalidated device data.  Last Pain:  Vitals:   04/17/21 0707  TempSrc: Oral  PainSc: 0-No pain         Complications: No notable events documented.

## 2021-04-17 NOTE — Anesthesia Preprocedure Evaluation (Signed)
Anesthesia Evaluation  Patient identified by MRN, date of birth, ID band Patient awake    Reviewed: Allergy & Precautions, NPO status , Patient's Chart, lab work & pertinent test results  History of Anesthesia Complications Negative for: history of anesthetic complications  Airway Mallampati: III  TM Distance: >3 FB Neck ROM: Full    Dental  (+) Teeth Intact   Pulmonary sleep apnea and Continuous Positive Airway Pressure Ventilation , Patient abstained from smoking.Not current smoker, PE   Pulmonary exam normal breath sounds clear to auscultation       Cardiovascular hypertension, +CHF and + DOE  + dysrhythmias (s/p ablation) Atrial Fibrillation  Rhythm:Irregular Rate:Normal - Systolic murmurs TTE 1884 relatively unremarkable   Neuro/Psych negative neurological ROS  negative psych ROS   GI/Hepatic Neg liver ROS, hiatal hernia, GERD  ,Gastric mass   Endo/Other  diabetesMorbid obesity  Renal/GU negative Renal ROS  negative genitourinary   Musculoskeletal negative musculoskeletal ROS (+)   Abdominal (+) + obese,   Peds  Hematology Eliquis   Anesthesia Other Findings  Past Medical History: No date: Allergic rhinitis No date: Arrhythmia No date: Atrial fibrillation ([place]) 11/29/2018: Benign prostatic hyperplasia with urinary obstruction No date: CHF (congestive heart failure) ([place]) 09/29/2019: Diabetes mellitus due to underlying condition with  unspecified complications ([place]) No date: Diabetes mellitus without complication ([place]) 16/60/6301: DOE (dyspnea on exertion)     Comment:  S/p PE  11/2018 > DOAC recurrent 09/14/2019 off DOAC so               resumed  - onset variable doe and orthostatic               lightheadedness 06/2019 with afib - PFT's Oval Linsey                10/08/2019 :  FEV1 2.79 (73%) with ratio 75 and 6 %               better p saba,  Air trapping on lung vol,  ERV 14% and               dlco 22.78  (60%) corrects to 3.68 (76%) for vol with               minimal concavity to f/v loop  - 10/29/2019   Walked RA x              two laps =  approx 523ft @ avg 09/29/2019: Essential hypertension 12/11/2016: Fissure in skin of foot 11/29/2018: GERD (gastroesophageal reflux disease) 02/22/2020: Hemospermia 09/29/2019: History of pulmonary embolism No date: Hyperlipidemia No date: Hypertension 02/22/2020: Hypogonadism in male 11/29/2018: IBS (irritable bowel syndrome) 02/22/2020: Impotence 02/22/2020: Incomplete emptying of bladder 09/29/2019: Mixed dyslipidemia 09/29/2019: Morbid obesity ([place]) No date: OSA on CPAP 12/11/2016: Overgrown toenails 09/29/2019: Persistent atrial fibrillation ([place]) 06/05/2016: Plantar fasciitis, bilateral 01/22/2020: Secondary hypercoagulable state ([place]) 12/15/2019: Upper airway cough syndrome     Comment:  Noted on exam 12/15/2019 onset while on Entresto,               resolved with purse lip   Reproductive/Obstetrics                             Anesthesia Physical  Anesthesia Plan  ASA: 3  Anesthesia Plan: General   Post-op Pain Management:    Induction: Intravenous  PONV Risk Score and Plan: 2 and Propofol infusion, TIVA and Treatment may vary due  to age or medical condition  Airway Management Planned: Natural Airway, Nasal Cannula and Simple Face Mask  Additional Equipment: None  Intra-op Plan:   Post-operative Plan:   Informed Consent: I have reviewed the patients History and Physical, chart, labs and discussed the procedure including the risks, benefits and alternatives for the proposed anesthesia with the patient or authorized representative who has indicated his/her understanding and acceptance.     Dental advisory given  Plan Discussed with: CRNA and Surgeon  Anesthesia Plan Comments: (Discussed risks of anesthesia with patient, including possibility of difficulty with spontaneous ventilation under anesthesia necessitating  airway intervention, PONV, and rare risks such as cardiac or respiratory or neurological events. Patient understands.)        Anesthesia Quick Evaluation

## 2021-04-17 NOTE — Telephone Encounter (Signed)
-----   Message from [place], PA-C sent at 04/16/2021  8:37 PM EDT ----- Cbc unremarkable

## 2021-04-17 NOTE — CV Procedure (Signed)
    Cardioversion Note  Sean [name] [id number] [birth year]  Procedure: DC Cardioversion Indications: Atrial flutter  Procedure Details Consent: Obtained Time Out: Verified patient identification, verified procedure, site/side was marked, verified correct patient position, special equipment/implants available, Radiology Safety Procedures followed,  medications/allergies/relevent history reviewed, required imaging and test results available.  Performed  The patient has been on adequate anticoagulation.  The patient received IV propofol by anesthesia for sedation.  Synchronous cardioversion was performed at 50 joules x 1 and 200 joules x 1.  The cardioversion was successful; he initially converted from atrial flutter to atrial fibrillation after the first shock.  Sinus bradycardia was restored after second shock.   Complications: No apparent complications Patient did tolerate procedure well.  Recommendations: Discontinue diltiazem. Continue current doses of metoprolol and apixaban.  [name]., MD 04/17/2021, 7:36 AM

## 2021-04-17 NOTE — Interval H&P Note (Signed)
History and Physical Interval Note:  04/17/2021 7:29 AM  Sean [name].  has presented today for surgery, with the diagnosis of atrial flutter.  The various methods of treatment have been discussed with the patient and family. After consideration of risks, benefits and other options for treatment, the patient has consented to  Procedure(s): CARDIOVERSION (N/A) as a surgical intervention.  The patient's history has been reviewed, patient examined, no change in status, stable for surgery.  I have reviewed the patient's chart and labs.  Questions were answered to the patient's satisfaction.     [name]

## 2021-04-17 NOTE — Telephone Encounter (Signed)
Reviewed results and recommendations with patient. He only came to our office because we were the only office that had appointment available. He did have cardioversion this morning as well. Reviewed instructions to take Lasix 20 mg daily for 4 days then have repeat labs in one week. He wants to have repeat labs done at his location in [place]. Advised that I am not sure how to schedule labs for that location but will forward over to them for further assistance with this. He was appreciative for the call with no further questions at this time.

## 2021-04-17 NOTE — Anesthesia Postprocedure Evaluation (Signed)
Anesthesia Post Note  Patient: Sean [name].  Procedure(s) Performed: CARDIOVERSION  Patient location during evaluation: [facility] Recovery Anesthesia Type: General Level of consciousness: awake and alert Pain management: pain level controlled Vital Signs Assessment: post-procedure vital signs reviewed and stable Respiratory status: spontaneous breathing, nonlabored ventilation, respiratory function stable and patient connected to nasal cannula oxygen Cardiovascular status: blood pressure returned to baseline and stable Postop Assessment: no apparent nausea or vomiting Anesthetic complications: no   No notable events documented.   Last Vitals:  Vitals:   04/17/21 0738 04/17/21 0739  BP: 92/63   Pulse: (!) 43 (!) 43  Resp:    Temp:    SpO2: 94% 93%    Last Pain:  Vitals:   04/17/21 0707  TempSrc: Oral  PainSc: 0-No pain                 Arita Miss

## 2021-04-17 NOTE — Telephone Encounter (Signed)
-----   Message from [place], PA-C sent at 04/16/2021  8:37 PM EDT ----- TSH normal Mag normal BMET showed mildly elevated kidney function, suspect it's from volume overloaded. Instruct him to take lasix 20mg  for 4 days and then re-check a bmet in 1 week.

## 2021-04-19 ENCOUNTER — Ambulatory Visit (HOSPITAL_COMMUNITY): Payer: Medicare Other | Admitting: Physician Assistant

## 2021-04-21 ENCOUNTER — Ambulatory Visit: Payer: Medicare Other | Admitting: Cardiology

## 2021-05-03 DIAGNOSIS — J309 Allergic rhinitis, unspecified: Secondary | ICD-10-CM | POA: Diagnosis not present

## 2021-05-03 DIAGNOSIS — I10 Essential (primary) hypertension: Secondary | ICD-10-CM | POA: Diagnosis not present

## 2021-05-03 DIAGNOSIS — G4700 Insomnia, unspecified: Secondary | ICD-10-CM | POA: Diagnosis not present

## 2021-05-03 DIAGNOSIS — D509 Iron deficiency anemia, unspecified: Secondary | ICD-10-CM | POA: Diagnosis not present

## 2021-05-03 DIAGNOSIS — M1710 Unilateral primary osteoarthritis, unspecified knee: Secondary | ICD-10-CM | POA: Diagnosis not present

## 2021-05-03 DIAGNOSIS — Z6841 Body Mass Index (BMI) 40.0 and over, adult: Secondary | ICD-10-CM | POA: Diagnosis not present

## 2021-05-03 DIAGNOSIS — E1121 Type 2 diabetes mellitus with diabetic nephropathy: Secondary | ICD-10-CM | POA: Diagnosis not present

## 2021-05-03 DIAGNOSIS — E559 Vitamin D deficiency, unspecified: Secondary | ICD-10-CM | POA: Diagnosis not present

## 2021-05-04 DIAGNOSIS — I480 Paroxysmal atrial fibrillation: Secondary | ICD-10-CM | POA: Diagnosis not present

## 2021-05-05 LAB — BASIC METABOLIC PANEL
BUN/Creatinine Ratio: 15 (ref 10–24)
BUN: 17 mg/dL (ref 8–27)
CO2: 21 mmol/L (ref 20–29)
Calcium: 9.6 mg/dL (ref 8.6–10.2)
Chloride: 101 mmol/L (ref 96–106)
Creatinine, Ser: 1.17 mg/dL (ref 0.76–1.27)
Glucose: 267 mg/dL — ABNORMAL HIGH (ref 65–99)
Potassium: 4.3 mmol/L (ref 3.5–5.2)
Sodium: 139 mmol/L (ref 134–144)
eGFR: 67 mL/min/{1.73_m2} (ref >59)

## 2021-05-22 DIAGNOSIS — M545 Low back pain, unspecified: Secondary | ICD-10-CM | POA: Diagnosis not present

## 2021-05-22 DIAGNOSIS — I10 Essential (primary) hypertension: Secondary | ICD-10-CM | POA: Diagnosis not present

## 2021-05-22 DIAGNOSIS — G8929 Other chronic pain: Secondary | ICD-10-CM | POA: Diagnosis not present

## 2021-05-22 DIAGNOSIS — G4730 Sleep apnea, unspecified: Secondary | ICD-10-CM | POA: Diagnosis not present

## 2021-05-29 DIAGNOSIS — M545 Low back pain, unspecified: Secondary | ICD-10-CM | POA: Diagnosis not present

## 2021-05-31 ENCOUNTER — Ambulatory Visit (INDEPENDENT_AMBULATORY_CARE_PROVIDER_SITE_OTHER): Payer: Medicare Other | Admitting: Sports Medicine

## 2021-05-31 ENCOUNTER — Other Ambulatory Visit: Payer: Self-pay

## 2021-05-31 ENCOUNTER — Ambulatory Visit (INDEPENDENT_AMBULATORY_CARE_PROVIDER_SITE_OTHER): Payer: Medicare Other

## 2021-05-31 ENCOUNTER — Encounter: Payer: Self-pay | Admitting: Sports Medicine

## 2021-05-31 DIAGNOSIS — S92401A Displaced unspecified fracture of right great toe, initial encounter for closed fracture: Secondary | ICD-10-CM | POA: Diagnosis not present

## 2021-05-31 DIAGNOSIS — M79671 Pain in right foot: Secondary | ICD-10-CM | POA: Diagnosis not present

## 2021-05-31 DIAGNOSIS — S90111A Contusion of right great toe without damage to nail, initial encounter: Secondary | ICD-10-CM | POA: Diagnosis not present

## 2021-05-31 DIAGNOSIS — E088 Diabetes mellitus due to underlying condition with unspecified complications: Secondary | ICD-10-CM | POA: Diagnosis not present

## 2021-05-31 NOTE — Progress Notes (Signed)
Subjective: Sean [name]. is a 70 y.o. male patient who presents to office for evaluation of Right great toe pain states that on Saturday he caught the toe underneath a rug and bit the toe under and bruised the toe states that he can move it but there is some pain when bending 2 out of 10 soreness has been using a bandage and a cold compress reports the first few days he had some drainage from his nailbed but now the area seems to be healed.  Patient is diabetic with blood sugar on yesterday 140 and last A1c unknown.  Patient Active Problem List   Diagnosis Date Noted   Atrial flutter ([place])    Melena 08/03/2020   Barrett's esophagus [id number]   Nonalcoholic fatty liver disease 08/03/2020   History of colonic polyps 08/03/2020   Submucosal lesion of stomach 08/03/2020   PAF (paroxysmal atrial fibrillation) ([place]) 02/23/2020   Cardiomyopathy ([place]) 02/23/2020   S/P ablation of atrial fibrillation 02/23/2020   Hypogonadism in male 02/22/2020   Impotence 02/22/2020   Hemospermia 02/22/2020   Incomplete emptying of bladder 02/22/2020   Secondary hypercoagulable state ([place]) 01/22/2020   Upper airway cough syndrome 12/15/2019   DOE (dyspnea on exertion) 10/29/2019   Essential hypertension 09/29/2019   Mixed dyslipidemia 09/29/2019   Diabetes mellitus due to underlying condition with unspecified complications ([place]) [id number]   History of pulmonary embolism 09/29/2019   Morbid obesity ([place]) 09/29/2019   Benign prostatic hyperplasia with urinary obstruction 11/29/2018   GERD (gastroesophageal reflux disease) 11/29/2018   IBS (irritable bowel syndrome) 11/29/2018   Fissure in skin of foot 12/11/2016   Overgrown toenails 12/11/2016   Plantar fasciitis, bilateral 06/05/2016    Current Outpatient Medications on File Prior to Visit  Medication Sig Dispense Refill   albuterol (PROVENTIL) (2.5 MG/3ML) 0.083% nebulizer solution Take 2.5 mg by nebulization every 6 (six) hours as needed for  wheezing or shortness of breath.      albuterol (VENTOLIN HFA) 108 (90 Base) MCG/ACT inhaler Inhale 2 puffs into the lungs every 6 (six) hours as needed for wheezing or shortness of breath.      allopurinol (ZYLOPRIM) 100 MG tablet Take 100 mg by mouth in the morning.     Ascorbic Acid (VITAMIN C) 1000 MG tablet Take 1,000 mg by mouth 2 (two) times daily.     atorvastatin (LIPITOR) 20 MG tablet Take 1 tablet (20 mg total) by mouth daily. Patient needs appointment for further refills. 1 st attempt (Patient taking differently: Take 20 mg by mouth in the morning.) 30 tablet 0   Cholecalciferol (VITAMIN D3) 50 MCG (2000 UT) TABS Take 2,000 Units by mouth 2 (two) times daily.      ELIQUIS 5 MG TABS tablet Take 1 tablet (5 mg total) by mouth 2 (two) times daily. (Patient taking differently: Take 5 mg by mouth 2 (two) times daily.) 180 tablet 1   empagliflozin (JARDIANCE) 25 MG TABS tablet Take 25 mg by mouth in the morning.     eszopiclone (LUNESTA) 2 MG TABS tablet Take 2 mg by mouth at bedtime.      fluticasone (FLONASE) 50 MCG/ACT nasal spray Place 2 sprays into both nostrils daily as needed for allergies or rhinitis.     furosemide (LASIX) 40 MG tablet Take 20 mg by mouth in the morning.     gabapentin (NEURONTIN) 300 MG capsule Take 300 mg by mouth 3 (three) times daily.     Glucosamine-Chondroitin (COSAMIN DS PO) Take 1  tablet by mouth in the morning and at bedtime.     hydrALAZINE (APRESOLINE) 50 MG tablet Take 50 mg by mouth every 8 (eight) hours.     insulin glargine, 1 Unit Dial, (TOUJEO SOLOSTAR) 300 UNIT/ML Solostar Pen Inject 90 Units into the skin at bedtime.     insulin lispro (HUMALOG) 100 UNIT/ML injection Inject 0-10 Units into the skin 3 (three) times daily as needed for high blood sugar. Sliding Scale     metoprolol succinate (TOPROL-XL) 50 MG 24 hr tablet Take 1 tablet (50 mg total) by mouth daily. Take with or immediately following a meal. (Patient taking differently: Take 50 mg by  mouth daily with supper. Take with or immediately following a meal.) 90 tablet 2   montelukast (SINGULAIR) 10 MG tablet Take 10 mg by mouth at bedtime.     Omega-3 Fatty Acids (FISH OIL) 1200 MG CAPS Take 1,200 mg by mouth 2 (two) times daily.      omeprazole (PRILOSEC) 40 MG capsule Take 40 mg by mouth 2 (two) times daily.      rOPINIRole (REQUIP) 2 MG tablet Take 2 mg by mouth at bedtime.     sacubitril-valsartan (ENTRESTO) 24-26 MG Take 1 tablet by mouth 2 (two) times daily. 60 tablet 11   tamsulosin (FLOMAX) 0.4 MG CAPS capsule Take 0.4 capsules by mouth 2 (two) times daily.     traMADol (ULTRAM) 50 MG tablet Take 50 mg by mouth daily as needed for moderate pain.      TURMERIC PO Take 1,000 mg by mouth in the morning and at bedtime.     venlafaxine XR (EFFEXOR-XR) 150 MG 24 hr capsule Take 150 mg by mouth in the morning.     Vitamin D, Ergocalciferol, (DRISDOL) 1.25 MG (50000 UT) CAPS capsule Take 50,000 Units by mouth every Tuesday. IN THE MORNING     No current facility-administered medications on file prior to visit.    Allergies  Allergen Reactions   Prednisone Other (See Comments)    nervous   Victoza [Liraglutide] Diarrhea    Objective:  General: Alert and oriented x3 in no acute distress  Dermatology: No open lesions bilateral lower extremities, no webspace macerations, ecchymosis noted to the right hallux.  The right hallux nail is well attached with mild dried blood/abrasion at the proximal nail fold with no lifting or loosening and no underlying injury noted to the nail or nail bed on the right.  Vascular: Focal edema noted to right great toe.  DP and PT pedal pulses faintly palpable 1 out of 4 bilateral with chronic edema noted to bilateral ankles, Capillary Fill Time 3 seconds,(+) pedal hair growth bilateral, Temperature gradient within normal limits.  Neurology: Gross sensation intact via light touch bilateral.  Protective sensation intact to toes with Semmes Weinstein  monofilament.  Musculoskeletal: There is minimal tenderness to palpation at the right hallux IPJ.  There is no obvious digital deformity however there is guarding with range of motion to the first toe due to pain.  Gait: Unassisted, Antalgic gait  Xrays  Right foot   Impression: There is diffuse arthritis however at the right hallux IPJ lateral margin there is a small questionable chip fracture at the joint line of the distal interphalangeal joint.  No other acute fractures noted the overall positioning of the first toe acceptable alignment for conservative healing of this fracture.    Assessment and Plan: Problem List Items Addressed This Visit       Endocrine  Diabetes mellitus due to underlying condition with unspecified complications ([place])   Other Visit Diagnoses     Right foot pain    -  Primary   Relevant Orders   DG Foot Complete Right   Contusion of right great toe without damage to nail, initial encounter       Closed non-physeal fracture of phalanx of right great toe, unspecified phalanx, initial encounter            -Complete examination performed -Xrays reviewed -Discussed treatement options for fracture; risks, alternatives, and benefits explained. -Applied antibiotic cream and Band-Aid to abrasion at first toe nail fold.  Advised patient may need to do twice weekly until the abrasion has resolved. -Dispensed surgical shoe for patient to use as instructed advised patient he may need a cane -Recommend protection, rest, ice, elevation daily until symptoms improve -Patient to return to office in 4 weeks for serial x-rays to assess healing  or sooner if condition worsens.  Landis Martins, DPM

## 2021-06-04 ENCOUNTER — Other Ambulatory Visit: Payer: Self-pay

## 2021-06-05 ENCOUNTER — Telehealth: Payer: Self-pay | Admitting: Cardiology

## 2021-06-05 DIAGNOSIS — M545 Low back pain, unspecified: Secondary | ICD-10-CM | POA: Diagnosis not present

## 2021-06-05 DIAGNOSIS — G4700 Insomnia, unspecified: Secondary | ICD-10-CM | POA: Diagnosis not present

## 2021-06-05 DIAGNOSIS — G4730 Sleep apnea, unspecified: Secondary | ICD-10-CM | POA: Diagnosis not present

## 2021-06-05 DIAGNOSIS — I10 Essential (primary) hypertension: Secondary | ICD-10-CM | POA: Diagnosis not present

## 2021-06-05 MED ORDER — ATORVASTATIN CALCIUM 20 MG PO TABS
20.0000 mg | ORAL_TABLET | Freq: Every morning | ORAL | 1 refills | Status: DC
Start: 2021-06-05 — End: 2021-11-20

## 2021-06-05 NOTE — Telephone Encounter (Signed)
**Note De-Identified Sean [name] Obfuscation** I called the pt and advised him that I e-scribed his Atorvastatin refill to [facility] as he requested for #90 with 1 refill. He thanked me for my follow up call.

## 2021-06-05 NOTE — Telephone Encounter (Signed)
*  STAT* If patient is at the pharmacy, call can be transferred to refill team.   1. Which medications need to be refilled? (please list name of each medication and dose if known) atorvastatin (LIPITOR) 20 MG tablet   2. Which pharmacy/location (including street and city if local pharmacy) is medication to be sent to? [place], [place]  3. Do they need a 30 day or 90 day supply? 90 day  Patient is out of medication

## 2021-06-05 NOTE — Telephone Encounter (Signed)
STAT if HR is under 50 or over 120 (normal HR is 60-100 beats per minute)  What is your heart rate? 136  Do you have a log of your heart rate readings (document readings)? For  week 130-136  Do you have any other symptoms? States he does not feel good   Patient states his HR has been high for the last couple of weeks. He states his was BP 96/94 HR 131 today,

## 2021-06-05 NOTE — Telephone Encounter (Addendum)
Spoke with pt and for 2 weeks HR has been running 136 PCP increased Metoprolol from 50 mg to 100 mg a week ago with no change Per pt also notes fatigue  no other symptoms Per pt caffiene intake the same Per pt does not feel like afib Appt made with Dr Curt Bears for 06/13/21 @ 2:45 .Adonis Housekeeper

## 2021-06-06 ENCOUNTER — Telehealth: Payer: Self-pay

## 2021-06-06 ENCOUNTER — Encounter (HOSPITAL_BASED_OUTPATIENT_CLINIC_OR_DEPARTMENT_OTHER): Payer: Self-pay

## 2021-06-06 DIAGNOSIS — G4733 Obstructive sleep apnea (adult) (pediatric): Secondary | ICD-10-CM

## 2021-06-06 DIAGNOSIS — R5383 Other fatigue: Secondary | ICD-10-CM

## 2021-06-06 DIAGNOSIS — R0683 Snoring: Secondary | ICD-10-CM

## 2021-06-06 NOTE — Telephone Encounter (Signed)
I was contacting the patient for cardiac clearance when he told me he has been having fast heart rate with heart rate in the 130s for the past 3 weeks.  To make things worse, his systolic blood pressure is in the high 90s.  He denies any heart failure symptoms such as lower extremity edema, orthopnea or PND.  He is on both metoprolol succinate and low-dose Entresto.  Metoprolol succinate has been increased to 100 mg daily without effect on the heart rate.  He contacted our office in the past few days and he was offered an earlier appointment with Dr. Curt Bears on 06/13/2021.  Please also check to see if there is any A. fib clinic opening this week available.  Patient is aware that he will need to go to the emergency room if he does start having heart failure-like symptoms.  I have very low threshold of sending this patient to the ED.

## 2021-06-06 NOTE — Telephone Encounter (Signed)
   [facility] Pre-operative Risk Assessment    Patient Name: Sean [name].  DOB: 01/27/1951 MRN: [id number]  HEARTCARE STAFF:  - IMPORTANT!!!!!! Under Visit Info/Reason for Call, type in Other and utilize the format Clearance MM/DD/YY or Clearance TBD. Do not use dashes or single digits. - Please review there is not already an duplicate clearance open for this procedure. - If request is for dental extraction, please clarify the # of teeth to be extracted. - If the patient is currently at the dentist's office, call Pre-Op Callback Staff (MA/nurse) to input urgent request.  - If the patient is not currently in the dentist office, please route to the Pre-Op pool.  Request for surgical clearance:  What type of surgery is being performed? Lumbar epidural steroid injection  When is this surgery scheduled? TBD  What type of clearance is required (medical clearance vs. Pharmacy clearance to hold med vs. Both)? Both  Are there any medications that need to be held prior to surgery and how long? Eliquis 48 hours prior to   Practice name and name of physician performing surgery? [facility]  What is the office phone number? [phone]   7.   What is the office fax number? [phone] Attention Kim  8.   Anesthesia type (None, local, MAC, general) ? Local   Nichele Slawson Tressa Busman 06/06/2021, 2:35 PM  _________________________________________________________________   (provider comments below)

## 2021-06-06 NOTE — Telephone Encounter (Signed)
Patient with diagnosis of afib on Eliquis for anticoagulation.    Procedure: Lumbar epidural steroid injection Date of procedure: TBD  Patient last cardioversion was 6/20, therefore we are more than 30 days out. He has a hx of PE in 2020. Per Note from Dr. Geraldo Pitter "History of pulmonary embolism: Recurrent: Patient is on lifelong anticoagulation and benefits and potential risks of anticoagulation explained and he vocalized understanding."  CHA2DS2-VASc Score = 5  This indicates a 7.2% annual risk of stroke. The patient's score is based upon: CHF History: Yes HTN History: Yes Diabetes History: Yes Stroke History: No Vascular Disease History: Yes Age Score: 1 Gender Score: 0      CrCl 85.94 Platelet count 219  We typically would hold 3 days for spinal procedures. MD requesting 48 hr. Pt with hx of PE. Will defer to Dr. Geraldo Pitter

## 2021-06-06 NOTE — Telephone Encounter (Signed)
Contacted the patient for cardiac clearance, however it appears patient may have gone back into atrial fibrillation.  He says he has had tachycardia palpitation for the past 3 weeks.  His heart rate is persistently in the 130s.  He initially had a later appointment with Dr. Curt Bears however he moved his appointment up to next Tuesday, 06/13/2021.  I am unable to clear the patient at this time until he can be seen and heart rate addressed by EP service.

## 2021-06-06 NOTE — Telephone Encounter (Signed)
Clinical pharmacist to review Eliquis.  She has a history of PE and A. fib.  Last cardioversion was in June 2022.  She is more than 4 weeks out from the last cardioversion.

## 2021-06-07 ENCOUNTER — Other Ambulatory Visit: Payer: Self-pay

## 2021-06-07 ENCOUNTER — Ambulatory Visit (HOSPITAL_COMMUNITY)
Admission: RE | Admit: 2021-06-07 | Discharge: 2021-06-07 | Disposition: A | Discharge status: Home / Self Care | Payer: Medicare Other | Source: Ambulatory Visit | Attending: Physician Assistant | Admitting: Physician Assistant

## 2021-06-07 ENCOUNTER — Other Ambulatory Visit: Payer: Self-pay | Admitting: Sports Medicine

## 2021-06-07 VITALS — BP 140/82 | HR 108 | Ht 74.0 in | Wt 308.8 lb

## 2021-06-07 DIAGNOSIS — I4892 Unspecified atrial flutter: Secondary | ICD-10-CM | POA: Diagnosis not present

## 2021-06-07 DIAGNOSIS — Z713 Dietary counseling and surveillance: Secondary | ICD-10-CM | POA: Diagnosis not present

## 2021-06-07 DIAGNOSIS — E669 Obesity, unspecified: Secondary | ICD-10-CM | POA: Insufficient documentation

## 2021-06-07 DIAGNOSIS — Z7901 Long term (current) use of anticoagulants: Secondary | ICD-10-CM | POA: Insufficient documentation

## 2021-06-07 DIAGNOSIS — Z6839 Body mass index (BMI) 39.0-39.9, adult: Secondary | ICD-10-CM | POA: Insufficient documentation

## 2021-06-07 DIAGNOSIS — I4819 Other persistent atrial fibrillation: Secondary | ICD-10-CM | POA: Insufficient documentation

## 2021-06-07 DIAGNOSIS — Z8249 Family history of ischemic heart disease and other diseases of the circulatory system: Secondary | ICD-10-CM | POA: Insufficient documentation

## 2021-06-07 DIAGNOSIS — I10 Essential (primary) hypertension: Secondary | ICD-10-CM | POA: Insufficient documentation

## 2021-06-07 DIAGNOSIS — I484 Atypical atrial flutter: Secondary | ICD-10-CM

## 2021-06-07 DIAGNOSIS — I5022 Chronic systolic (congestive) heart failure: Secondary | ICD-10-CM | POA: Insufficient documentation

## 2021-06-07 DIAGNOSIS — D6869 Other thrombophilia: Secondary | ICD-10-CM | POA: Insufficient documentation

## 2021-06-07 DIAGNOSIS — I428 Other cardiomyopathies: Secondary | ICD-10-CM | POA: Diagnosis not present

## 2021-06-07 DIAGNOSIS — S92401A Displaced unspecified fracture of right great toe, initial encounter for closed fracture: Secondary | ICD-10-CM

## 2021-06-07 MED ORDER — METOPROLOL SUCCINATE ER 50 MG PO TB24: 50.0000 mg | ORAL_TABLET | Freq: Every day | ORAL | 2 refills | Status: DC

## 2021-06-07 NOTE — Progress Notes (Signed)
Primary Care Physician: [name], MD Primary Cardiologist: Dr Geraldo Pitter Primary Electrophysiologist: Dr Curt Bears Referring Physician: Dr Jeri Lager [name] is a 70 y.o. male with a history of hypertension, hyperlipidemia, CHF, and atrial fibrillation who presents for follow up in the [facility].  He had an attempted cardioversion 11/05/2019 but unfortunately did not return to sinus rhythm despite amiodarone.  He has been having symptoms of weakness, fatigue and shortness of breath. He had an echocardiogram that showed an ejection fraction of 35 to 40%. Patient is on Eliquis for a CHADS2VASC score of 4. Patient is s/p afib ablation with Dr Curt Bears on 12/25/19 with normalization of EF.  He was seen at the ED 04/12/21 with an elevated heart rate and found to be back in afib. Of note, he was also diagnosed with COVID around that time. He was rate controlled with cardiology follow up. He was set up for outpatient DCCV on 04/17/21. Unfortunately, patient was noted to be back in afib as part of preoperative risk evaluation for a lumbar steroid injection. He denies any awareness of his arrhythmia but his heart rate has been between 100-130 bpm.   Today, he denies symptoms of palpitations, chest pain, shortness of breath, orthopnea, PND, lower extremity edema, dizziness, presyncope, syncope, snoring, daytime somnolence, bleeding, or neurologic sequela. The patient is tolerating medications without difficulties and is otherwise without complaint today.    Atrial Fibrillation Risk Factors:  he does not have symptoms or diagnosis of sleep apnea. he does not have a history of rheumatic fever.   he has a BMI of Body mass index is 39.65 kg/m.Marland Kitchen Filed Weights   06/07/21 1509  Weight: (!) 140.1 kg    Family History  Problem Relation Age of Onset   Hyperlipidemia Father    Hypertension Father    Colon cancer Father    Liver cancer Father    Lung cancer Father       Atrial Fibrillation Management history:  Previous antiarrhythmic drugs: amiodarone Previous cardioversions: 11/05/19, 04/17/21 Previous ablations: 12/25/19 CHADS2VASC score: 4 Anticoagulation history: Eliquis   Past Medical History:  Diagnosis Date   Allergic rhinitis    Arrhythmia    Atrial fibrillation (HCC)    Benign prostatic hyperplasia with urinary obstruction 11/29/2018   CHF (congestive heart failure) ([place])    Diabetes mellitus due to underlying condition with unspecified complications ([place]) [id number]   Diabetes mellitus without complication (HCC)    DOE (dyspnea on exertion) 10/29/2019   S/p PE  11/2018 > DOAC recurrent 09/14/2019 off DOAC so resumed  - onset variable doe and orthostatic lightheadedness 06/2019 with afib - PFT's Sean [name]  10/08/2019 :  FEV1 2.79 (73%) with ratio 75 and 6 % better p saba,  Air trapping on lung vol,  ERV 14% and dlco 22.78 (60%) corrects to 3.68 (76%) for vol with minimal concavity to f/v loop  - 10/29/2019   Walked RA x two laps =  approx 545f @ avg   Essential hypertension 09/29/2019   Fissure in skin of foot 12/11/2016   GERD (gastroesophageal reflux disease) 11/29/2018   Hemospermia 02/22/2020   History of pulmonary embolism 09/29/2019   Hyperlipidemia    Hypertension    Hypogonadism in male 02/22/2020   IBS (irritable bowel syndrome) 11/29/2018   Impotence 02/22/2020   Incomplete emptying of bladder 02/22/2020   Mixed dyslipidemia 09/29/2019   Morbid obesity ([place] 09/29/2019   OSA on CPAP    Overgrown toenails 12/11/2016  Persistent atrial fibrillation ([place]) 09/29/2019   Plantar fasciitis, bilateral 06/05/2016   Secondary hypercoagulable state ([place]) 01/22/2020   Upper airway cough syndrome 12/15/2019   Noted on exam 12/15/2019 onset while on Entresto, resolved with purse lip   Past Surgical History:  Procedure Laterality Date   ATRIAL FIBRILLATION ABLATION N/A 12/25/2019   Procedure: ATRIAL FIBRILLATION ABLATION;  Surgeon: [name],  MD;  Location: [facility];  Service: Cardiovascular;  Laterality: N/A;   BIOPSY  08/11/2020   Procedure: BIOPSY;  Surgeon: [name], MD;  Location: [facility];  Service: Endoscopy;;   CARDIAC CATHETERIZATION     CARDIOVERSION N/A 04/17/2021   Procedure: CARDIOVERSION;  Surgeon: [name], MD;  Location: [facility];  Service: Cardiovascular;  Laterality: N/A;   ESOPHAGOGASTRODUODENOSCOPY (EGD) WITH PROPOFOL N/A 08/11/2020   Procedure: ESOPHAGOGASTRODUODENOSCOPY (EGD) WITH PROPOFOL;  Surgeon: [name], MD;  Location: [facility];  Service: Endoscopy;  Laterality: N/A;   KNEE SURGERY     PILONIDAL CYST / SINUS EXCISION     UPPER ESOPHAGEAL ENDOSCOPIC ULTRASOUND (EUS) N/A 08/11/2020   Procedure: UPPER ESOPHAGEAL ENDOSCOPIC ULTRASOUND (EUS);  Surgeon: [name], MD;  Location: [facility];  Service: Endoscopy;  Laterality: N/A;    Current Outpatient Medications  Medication Sig Dispense Refill   albuterol (PROVENTIL) (2.5 MG/3ML) 0.083% nebulizer solution Take 2.5 mg by nebulization every 6 (six) hours as needed for wheezing or shortness of breath.      albuterol (VENTOLIN HFA) 108 (90 Base) MCG/ACT inhaler Inhale 2 puffs into the lungs every 6 (six) hours as needed for wheezing or shortness of breath.      allopurinol (ZYLOPRIM) 100 MG tablet Take 100 mg by mouth in the morning.     Ascorbic Acid (VITAMIN C) 1000 MG tablet Take 1,000 mg by mouth 2 (two) times daily.     atorvastatin (LIPITOR) 20 MG tablet Take 1 tablet (20 mg total) by mouth in the morning. 90 tablet 1   Cholecalciferol (VITAMIN D3) 50 MCG (2000 UT) TABS Take 2,000 Units by mouth 2 (two) times daily.      ELIQUIS 5 MG TABS tablet Take 1 tablet (5 mg total) by mouth 2 (two) times daily. (Patient taking differently: Take 5 mg by mouth 2 (two) times daily.) 180 tablet 1   empagliflozin (JARDIANCE) 25 MG TABS tablet Take 25 mg by mouth in the morning.     Eszopiclone 3 MG TABS Take 3 mg by mouth  at bedtime.     fluticasone (FLONASE) 50 MCG/ACT nasal spray Place 2 sprays into both nostrils daily as needed for allergies or rhinitis.     furosemide (LASIX) 40 MG tablet Take 40 mg by mouth as needed.     gabapentin (NEURONTIN) 300 MG capsule Take 300 mg by mouth at bedtime.     Glucosamine-Chondroitin (COSAMIN DS PO) Take 1 tablet by mouth in the morning and at bedtime.     hydrALAZINE (APRESOLINE) 50 MG tablet Take 50 mg by mouth every 8 (eight) hours.     insulin glargine, 1 Unit Dial, (TOUJEO SOLOSTAR) 300 UNIT/ML Solostar Pen Inject 94 Units into the skin at bedtime.     insulin lispro (HUMALOG) 100 UNIT/ML injection Inject 0-10 Units into the skin 3 (three) times daily as needed for high blood sugar. Sliding Scale     metoprolol succinate (TOPROL-XL) 100 MG 24 hr tablet Take 100 mg by mouth daily.     montelukast (SINGULAIR) 10 MG tablet Take 10  mg by mouth at bedtime.     Omega-3 Fatty Acids (FISH OIL) 1200 MG CAPS Take 1,200 mg by mouth 2 (two) times daily.      omeprazole (PRILOSEC) 40 MG capsule Take 40 mg by mouth 2 (two) times daily.      rOPINIRole (REQUIP) 2 MG tablet Take 2 mg by mouth at bedtime.     sacubitril-valsartan (ENTRESTO) 24-26 MG Take 1 tablet by mouth 2 (two) times daily. 60 tablet 11   tamsulosin (FLOMAX) 0.4 MG CAPS capsule Take 0.4 capsules by mouth 2 (two) times daily.     traMADol (ULTRAM) 50 MG tablet Take 50 mg by mouth daily as needed for moderate pain.      TURMERIC PO Take 1,000 mg by mouth in the morning and at bedtime.     venlafaxine XR (EFFEXOR-XR) 150 MG 24 hr capsule Take 150 mg by mouth in the morning.     Vitamin D, Ergocalciferol, (DRISDOL) 1.25 MG (50000 UT) CAPS capsule Take 50,000 Units by mouth every Tuesday. IN THE MORNING     No current facility-administered medications for this encounter.    Allergies  Allergen Reactions   Prednisone Other (See Comments)    nervous   Victoza [Liraglutide] Diarrhea    Social History    Socioeconomic History   Marital status: Married    Spouse name: Not on file   Number of children: Not on file   Years of education: Not on file   Highest education level: Not on file  Occupational History   Not on file  Tobacco Use   Smoking status: Never   Smokeless tobacco: Never  Vaping Use   Vaping Use: Never used  Substance and Sexual Activity   Alcohol use: Not Currently   Drug use: Never   Sexual activity: Not on file  Other Topics Concern   Not on file  Social History Narrative   Not on file   Social Determinants of Health   Financial Resource Strain: Not on file  Food Insecurity: Not on file  Transportation Needs: Not on file  Physical Activity: Not on file  Stress: Not on file  Social Connections: Not on file  Intimate Partner Violence: Not on file     ROS- All systems are reviewed and negative except as per the HPI above.  Physical Exam: Vitals:   06/07/21 1509  BP: 140/82  Pulse: (!) 108  Weight: (!) 140.1 kg  Height: '6\' 2"'$  (1.88 m)    GEN- The patient is a well appearing obese male, alert and oriented x 3 today.   HEENT-head normocephalic, atraumatic, sclera clear, conjunctiva pink, hearing intact, trachea midline. Lungs- Clear to ausculation bilaterally, normal work of breathing Heart- irregular rate and rhythm, no murmurs, rubs or gallops  GI- soft, NT, ND, + BS Extremities- no clubbing, cyanosis, or edema MS- no significant deformity or atrophy Skin- no rash or lesion Psych- euthymic mood, full affect Neuro- strength and sensation are intact   Wt Readings from Last 3 Encounters:  06/07/21 (!) 140.1 kg  04/17/21 (!) 139.3 kg  04/13/21 (!) 139.3 kg    EKG today demonstrates  Atypical atrial flutter with variable block Vent. rate 108 BPM PR interval * ms QRS duration 84 ms QT/QTcB 324/434 ms  Echo 03/11/20 demonstrated   1. Left ventricular ejection fraction, by estimation, is 60 to 65%. The  left ventricle has normal function.  The left ventricle has no regional  wall motion abnormalities. Left ventricular diastolic parameters  are  consistent with Grade I diastolic  dysfunction (impaired relaxation).   Epic records are reviewed at length today  CHA2DS2-VASc Score = 5  The patient's score is based upon: CHF History: Yes HTN History: Yes Diabetes History: Yes Stroke History: No Vascular Disease History: Yes Age Score: 1 Gender Score: 0      ASSESSMENT AND PLAN: 1. Persistent Atrial Fibrillation/atrial flutter The patient's CHA2DS2-VASc score is 5, indicating a 7.2% annual risk of stroke.   S/p ablation with Dr Curt Bears 12/25/19. We discussed therapeutic options today. Will increase metoprolol to 50 mg AM and 100 mg PM. Patient will watch BP closely at home. Has appointment with Dr Curt Bears next week. Patient is interested in repeat ablation if he is felt to be a good candidate. He is willing to postpone other procedures.  Continue Eliquis 5 mg BID  2. Secondary Hypercoagulable State (ICD10:  D68.69) The patient is at significant risk for stroke/thromboembolism based upon his CHA2DS2-VASc Score of 5.  Continue Apixaban (Eliquis).   3. Obesity Body mass index is 39.65 kg/m. Lifestyle modification was discussed and encouraged including regular physical activity and weight reduction.  4. Chronic systolic CHF NICM, normalized after ablation.  No signs or symptoms of fluid overload.  5. HTN Stable, no changes today.   Follow up with Dr Curt Bears as scheduled.    [facility] [address] [place], [place] [address] [phone] 06/07/2021 3:43 PM

## 2021-06-07 NOTE — Telephone Encounter (Signed)
Patient expressed concerns about symptoms consistent with atrial fibrillation.  Patient has follow-up appointment with Dr. Curt Bears on 06/13/2021.  I will defer his preoperative cardiac evaluation to Dr. Curt Bears at that time.  Please add preoperative cardiac evaluation to his appointment notation.  I will remove him from the preoperative cardiac pool.  Jossie Ng. Zaylah Blecha NP-C    06/07/2021, 9:26 AM [place] [address] Office [phone] Fax [phone]

## 2021-06-07 NOTE — Patient Instructions (Signed)
Increase metoprolol to '50mg'$  in the morning and '100mg'$  in the evening

## 2021-06-07 NOTE — Telephone Encounter (Signed)
Called patient and advised him that he can be seen today at [facility] today at 3:30 per request from Almyra Deforest, [place] after talking with patient on 8/9 . Patient agrees with plan and verbalized understanding of location and details of appointment. He is aware of future appointments with Dr. Curt Bears and will ask for input from Adline Peals, [place] today regarding the need for the appointments on 8/16 and 8/29. The patient thanked me for our assistance.

## 2021-06-08 NOTE — Telephone Encounter (Signed)
Pt has appt 06/13/21 with Dr. Curt Bears. Have added pre op clearance needed to appts notes. Per pre op provider will have Dr. Curt Bears assess pt for pre op clearance. Will forward clearance notes to MD for appt. Will send FYI to surgeon's office pt has appt 8/169/22.

## 2021-06-13 ENCOUNTER — Encounter: Payer: Self-pay | Admitting: Cardiology

## 2021-06-13 ENCOUNTER — Other Ambulatory Visit: Payer: Self-pay

## 2021-06-13 ENCOUNTER — Ambulatory Visit (INDEPENDENT_AMBULATORY_CARE_PROVIDER_SITE_OTHER): Payer: Medicare Other | Admitting: Cardiology

## 2021-06-13 VITALS — BP 110/88 | HR 129 | Ht 74.0 in | Wt 310.0 lb

## 2021-06-13 DIAGNOSIS — Z01818 Encounter for other preprocedural examination: Secondary | ICD-10-CM | POA: Diagnosis not present

## 2021-06-13 DIAGNOSIS — I4819 Other persistent atrial fibrillation: Secondary | ICD-10-CM

## 2021-06-13 DIAGNOSIS — I484 Atypical atrial flutter: Secondary | ICD-10-CM

## 2021-06-13 DIAGNOSIS — Z01812 Encounter for preprocedural laboratory examination: Secondary | ICD-10-CM

## 2021-06-13 MED ORDER — DILTIAZEM HCL ER COATED BEADS 180 MG PO CP24: 180.0000 mg | ORAL_CAPSULE | Freq: Every day | ORAL | 3 refills | Status: DC

## 2021-06-13 NOTE — Progress Notes (Signed)
Electrophysiology Office Note   Date:  06/13/2021   ID:  Sean [name]., DOB [birth year], MRN [id number]  PCP:  [name], MD  Cardiologist:  [name] Primary Electrophysiologist:  Lyndell Gillyard Meredith Leeds, MD    Chief Complaint: AF   History of Present Illness: Sean [name]. is a 70 y.o. male who is being seen today for the evaluation of AF at the request of [name], MD. Presenting today for electrophysiology evaluation.  He has a history significant for hypertension, hyperlipidemia, CHF, and atrial fibrillation.  He had an attempted cardioversion on 11/05/2019 but unfortunately did not go back into normal rhythm.  He was having symptoms of fatigue echo showed an ejection fraction of 35 to 40%.  He is status post atrial fibrillation ablation 12/25/2019.  Repeat echo showed normalization of his ejection fraction.  He was seen in the emergency room 04/12/2021 with elevated heart rates and was noted to be back in atrial fibrillation.  He was diagnosed with COVID around that time.  He had a cardioversion 04/17/2021.  Unfortunately he was back in atrial fibrillation as part of a preoperative risk evaluation for a lumbar steroid injection.  Today, denies symptoms of palpitations, chest pain, shortness of breath, orthopnea, PND, lower extremity edema, claudication, dizziness, presyncope, syncope, bleeding, or neurologic sequela. The patient is tolerating medications without difficulties.  Unfortunately he is back in atrial fibrillation.  He currently has no chest pain, but does have fatigue and weakness.  He has scheduled for potential knee surgery next summer as well as needing any back injections.  He would like to get his atrial fibrillation ablated prior to any further orthopedic procedures.   Past Medical History:  Diagnosis Date   Allergic rhinitis    Arrhythmia    Atrial fibrillation ([place])    Benign prostatic hyperplasia with urinary obstruction 11/29/2018   CHF (congestive heart  failure) ([place])    Diabetes mellitus due to underlying condition with unspecified complications ([place]) [id number]   Diabetes mellitus without complication (HCC)    DOE (dyspnea on exertion) 10/29/2019   S/p PE  11/2018 > DOAC recurrent 09/14/2019 off DOAC so resumed  - onset variable doe and orthostatic lightheadedness 06/2019 with afib - PFT's [place]  10/08/2019 :  FEV1 2.79 (73%) with ratio 75 and 6 % better p saba,  Air trapping on lung vol,  ERV 14% and dlco 22.78 (60%) corrects to 3.68 (76%) for vol with minimal concavity to f/v loop  - 10/29/2019   Walked RA x two laps =  approx 577f @ avg   Essential hypertension 09/29/2019   Fissure in skin of foot 12/11/2016   GERD (gastroesophageal reflux disease) 11/29/2018   Hemospermia 02/22/2020   History of pulmonary embolism 09/29/2019   Hyperlipidemia    Hypertension    Hypogonadism in male 02/22/2020   IBS (irritable bowel syndrome) 11/29/2018   Impotence 02/22/2020   Incomplete emptying of bladder 02/22/2020   Mixed dyslipidemia 09/29/2019   Morbid obesity ([place] 09/29/2019   OSA on CPAP    Overgrown toenails 12/11/2016   Persistent atrial fibrillation ([place] 09/29/2019   Plantar fasciitis, bilateral 06/05/2016   Secondary hypercoagulable state ([place] 01/22/2020   Upper airway cough syndrome 12/15/2019   Noted on exam 12/15/2019 onset while on Entresto, resolved with purse lip   Past Surgical History:  Procedure Laterality Date   ATRIAL FIBRILLATION ABLATION N/A 12/25/2019   Procedure: ATRIAL FIBRILLATION ABLATION;  Surgeon: CConstance Haw MD;  [facility];  Service: Cardiovascular;  Laterality: N/A;   BIOPSY  08/11/2020   Procedure: BIOPSY;  Surgeon: [name], MD;  [facility];  Service: Endoscopy;;   CARDIAC CATHETERIZATION     CARDIOVERSION N/A 04/17/2021   Procedure: CARDIOVERSION;  Surgeon: [name], MD;  Location: [facility];  Service: Cardiovascular;  Laterality: N/A;   ESOPHAGOGASTRODUODENOSCOPY  (EGD) WITH PROPOFOL N/A 08/11/2020   Procedure: ESOPHAGOGASTRODUODENOSCOPY (EGD) WITH PROPOFOL;  Surgeon: [name], MD;  [facility];  Service: Endoscopy;  Laterality: N/A;   KNEE SURGERY     PILONIDAL CYST / SINUS EXCISION     UPPER ESOPHAGEAL ENDOSCOPIC ULTRASOUND (EUS) N/A 08/11/2020   Procedure: UPPER ESOPHAGEAL ENDOSCOPIC ULTRASOUND (EUS);  Surgeon: [name], MD;  [facility];  Service: Endoscopy;  Laterality: N/A;     Current Outpatient Medications  Medication Sig Dispense Refill   albuterol (PROVENTIL) (2.5 MG/3ML) 0.083% nebulizer solution Take 2.5 mg by nebulization every 6 (six) hours as needed for wheezing or shortness of breath.      albuterol (VENTOLIN HFA) 108 (90 Base) MCG/ACT inhaler Inhale 2 puffs into the lungs every 6 (six) hours as needed for wheezing or shortness of breath.      allopurinol (ZYLOPRIM) 100 MG tablet Take 100 mg by mouth in the morning.     Ascorbic Acid (VITAMIN C) 1000 MG tablet Take 1,000 mg by mouth 2 (two) times daily.     atorvastatin (LIPITOR) 20 MG tablet Take 1 tablet (20 mg total) by mouth in the morning. 90 tablet 1   Cholecalciferol (VITAMIN D3) 50 MCG (2000 UT) TABS Take 2,000 Units by mouth 2 (two) times daily.      ELIQUIS 5 MG TABS tablet Take 1 tablet (5 mg total) by mouth 2 (two) times daily. (Patient taking differently: Take 5 mg by mouth 2 (two) times daily.) 180 tablet 1   empagliflozin (JARDIANCE) 25 MG TABS tablet Take 25 mg by mouth in the morning.     Eszopiclone 3 MG TABS Take 3 mg by mouth at bedtime.     fluticasone (FLONASE) 50 MCG/ACT nasal spray Place 2 sprays into both nostrils daily as needed for allergies or rhinitis.     furosemide (LASIX) 40 MG tablet Take 40 mg by mouth as needed.     gabapentin (NEURONTIN) 300 MG capsule Take 300 mg by mouth at bedtime.     Glucosamine-Chondroitin (COSAMIN DS PO) Take 1 tablet by mouth in the morning and at bedtime.     hydrALAZINE (APRESOLINE) 50  MG tablet Take 50 mg by mouth every 8 (eight) hours.     insulin glargine, 1 Unit Dial, (TOUJEO SOLOSTAR) 300 UNIT/ML Solostar Pen Inject 94 Units into the skin at bedtime.     insulin lispro (HUMALOG) 100 UNIT/ML injection Inject 0-10 Units into the skin 3 (three) times daily as needed for high blood sugar. Sliding Scale     metoprolol succinate (TOPROL-XL) 100 MG 24 hr tablet Take 100 mg by mouth at bedtime.     metoprolol succinate (TOPROL-XL) 50 MG 24 hr tablet Take 1 tablet (50 mg total) by mouth daily. Take with or immediately following a meal. 90 tablet 2   montelukast (SINGULAIR) 10 MG tablet Take 10 mg by mouth at bedtime.     Omega-3 Fatty Acids (FISH OIL) 1200 MG CAPS Take 1,200 mg by mouth 2 (two) times daily.      omeprazole (PRILOSEC) 40 MG capsule Take 40 mg by mouth 2 (two)  times daily.      rOPINIRole (REQUIP) 2 MG tablet Take 2 mg by mouth at bedtime.     sacubitril-valsartan (ENTRESTO) 24-26 MG Take 1 tablet by mouth 2 (two) times daily. 60 tablet 11   tamsulosin (FLOMAX) 0.4 MG CAPS capsule Take 0.4 capsules by mouth 2 (two) times daily.     traMADol (ULTRAM) 50 MG tablet Take 50 mg by mouth daily as needed for moderate pain.      TURMERIC PO Take 1,000 mg by mouth in the morning and at bedtime.     venlafaxine XR (EFFEXOR-XR) 150 MG 24 hr capsule Take 150 mg by mouth in the morning.     Vitamin D, Ergocalciferol, (DRISDOL) 1.25 MG (50000 UT) CAPS capsule Take 50,000 Units by mouth every Tuesday. IN THE MORNING     No current facility-administered medications for this visit.    Allergies:   Prednisone and Victoza [liraglutide]   Social History:  The patient  reports that he has never smoked. He has never used smokeless tobacco. He reports that he does not currently use alcohol. He reports that he does not use drugs.   Family History:  The patient's family history includes Colon cancer in his father; Hyperlipidemia in his father; Hypertension in his father; Liver cancer in  his father; Lung cancer in his father.    ROS:  Please see the history of present illness.   Otherwise, review of systems is positive for none.   All other systems are reviewed and negative.   PHYSICAL EXAM: VS:  BP 110/88   Pulse (!) 129   Ht '6\' 2"'$  (1.88 m)   Wt (!) 310 lb (140.6 kg)   BMI 39.80 kg/m  , BMI Body mass index is 39.8 kg/m. GEN: Well nourished, well developed, in no acute distress  HEENT: normal  Neck: no JVD, carotid bruits, or masses Cardiac: irreglar; no murmurs, rubs, or gallops,no edema  Respiratory:  clear to auscultation bilaterally, normal work of breathing GI: soft, nontender, nondistended, + BS MS: no deformity or atrophy  Skin: warm and dry Neuro:  Strength and sensation are intact Psych: euthymic mood, full affect  EKG:  EKG is not ordered today. Personal review of the ekg ordered 06/07/21 shows atrial flutter, rate 108  Recent Labs: 04/12/2021: B Natriuretic Peptide 219.3 04/13/2021: Hemoglobin 13.7; Magnesium 2.2; Platelets 219; TSH 1.090 05/04/2021: BUN 17; Creatinine, Ser 1.17; Potassium 4.3; Sodium 139    Lipid Panel     Component Value Date/Time   CHOL 135 10/09/2019 0820   TRIG 159 (H) 10/09/2019 0820   HDL 47 10/09/2019 0820   CHOLHDL 2.9 10/09/2019 0820   LDLCALC 61 10/09/2019 0820     Wt Readings from Last 3 Encounters:  06/13/21 (!) 310 lb (140.6 kg)  06/07/21 (!) 308 lb 12.8 oz (140.1 kg)  04/17/21 (!) 307 lb 1.6 oz (139.3 kg)      Other studies Reviewed: Additional studies/ records that were reviewed today include: TTE 03/11/2020 Review of the above records today demonstrates:   1. Left ventricular ejection fraction, by estimation, is 60 to 65%. The  left ventricle has normal function. The left ventricle has no regional  wall motion abnormalities. Left ventricular diastolic parameters are  consistent with Grade I diastolic  dysfunction (impaired relaxation).   ASSESSMENT AND PLAN:  1.  Persistent atrial  fibrillation/atrial flutter: Status post ablation 12/25/2019.  Currently on Eliquis with a CHA2DS2-VASc of 3.  Unfortunately he is back in atrial flutter today.  He is feeling weak and fatigued.  He would prefer ablation for recurrent atrial fibrillation and atrial flutter.  Risks and benefits of been discussed.  He understands these risks and has agreed to the procedure.  For improved heart rate control, Dominigue Gellner add diltiazem 180 mg.  Risk, benefits, and alternatives to EP study and radiofrequency ablation for afib were also discussed in detail today. These risks include but are not limited to stroke, bleeding, vascular damage, tamponade, perforation, damage to the esophagus, lungs, and other structures, pulmonary vein stenosis, worsening renal function, and death. The patient understands these risk and wishes to proceed.  We Maveryck Bahri therefore proceed with catheter ablation at the next available time.  Carto, ICE, anesthesia are requested for the procedure.  Iriel Nason also obtain CT PV protocol prior to the procedure to exclude LAA thrombus and further evaluate atrial anatomy.   2.  Chronic systolic heart failure due to nonischemic cardiomyopathy: Ejection fraction 35 to 40%.  Currently on Entresto and metoprolol.  Ejection fraction has normalized since ablation.  No obvious volume overload.  3.  Hypertension: Currently well controlled  4.  Preoperative evaluation: He would be at intermediate risk for a low risk procedure, back injection.  If it is possible to be done, Joncarlo Friberg need 3 weeks of uninterrupted anticoagulation prior to his A. fib ablation and thus it would need to be done likely in August.  He would be able to hold his anticoagulation for 3 days prior to injection, and restart as soon as possible per injecting physician.  Case discussed with primary cardiology.  Current medicines are reviewed at length with the patient today.   The patient does not have concerns regarding his medicines.  The following  changes were made today: Start diltiazem 180 mg  Labs/ tests ordered today include:  Orders Placed This Encounter  Procedures   CT CARDIAC MORPH/PULM VEIN W/CM&W/O CA SCORE   Basic metabolic panel   CBC      Disposition:   FU with Tazia Illescas 43month  Signed, Margareth Kanner MMeredith Leeds MD  06/13/2021 3:18 PM     [address] [phone](office) [phone])

## 2021-06-13 NOTE — Patient Instructions (Signed)
Medication Instructions:  Your physician has recommended you make the following change in your medication: START Diltiazem 180 mg once daily  *If you need a refill on your cardiac medications before your next appointment, please call your pharmacy*   Lab Work: None ordered  If you have labs (blood work) drawn today and your tests are completely normal, you will receive your results only by: [place] (if you have MyChart) OR A paper copy in the mail If you have any lab test that is abnormal or we need to change your treatment, we will call you to review the results.   Testing/Procedures: Your physician has requested that you have cardiac CT within 7 days PRIOR to your ablation. Cardiac computed tomography (CT) is a painless test that uses an x-ray machine to take clear, detailed pictures of your heart.  Please follow instruction below located under "other instructions". You will get a call from our office to schedule the date for this test.  Your physician has recommended that you have an ablation. Catheter ablation is a medical procedure used to treat some cardiac arrhythmias (irregular heartbeats). During catheter ablation, a long, thin, flexible tube is put into a blood vessel in your groin (upper thigh), or neck. This tube is called an ablation catheter. It is then guided to your heart through the blood vessel. Radio frequency waves destroy small areas of heart tissue where abnormal heartbeats may cause an arrhythmia to start. Please follow instruction below located under "other instructions".   Follow-Up: At [facility], you and your health needs are our priority.  As part of our continuing mission to provide you with exceptional heart care, we have created designated Provider Care Teams.  These Care Teams include your primary Cardiologist (physician) and Advanced Practice Providers (APPs -  Physician Assistants and Nurse Practitioners) who all work together to provide you with  the care you need, when you need it.  We recommend signing up for the patient portal called "MyChart".  Sign up information is provided on this After Visit Summary.  MyChart is used to connect with patients for Virtual Visits (Telemedicine).  Patients are able to view lab/test results, encounter notes, upcoming appointments, etc.  Non-urgent messages can be sent to your provider as well.   To learn more about what you can do with MyChart, go to [email or web address].    Your next appointment:   1 month(s) after your ablation  The format for your next appointment:   In Person  Provider:   AFib clinic   Thank you for choosing [facility]!!   Trinidad Curet, RN [phone]    Other Instructions   CT INSTRUCTIONS Your cardiac CT will be scheduled at:  [facility] [address] [place], [place] [address] [phone]  Please arrive at the [facility] main entrance (entrance A) of [facility] 30 minutes prior to test start time. Proceed to the [facility] Radiology Department (first floor) to check-in and test prep.  Please follow these instructions carefully (unless otherwise directed):  Hold all erectile dysfunction medications at least 3 days (72 hrs) prior to test.  On the Night Before the Test: Be sure to Drink plenty of water. Do not consume any caffeinated/decaffeinated beverages or chocolate 12 hours prior to your test. Do not take any antihistamines 12 hours prior to your test.  On the Day of the Test: Drink plenty of water until 1 hour prior to the test. Do not eat any food 4  hours prior to the test. You may take your regular medications prior to the test.  HOLD Furosemide/Hydrochlorothiazide morning of the test.      After the Test: Drink plenty of water. After receiving IV contrast, you may experience a mild flushed feeling. This is normal. On occasion, you may experience a mild rash up to 24 hours after the test. This is not  dangerous. If this occurs, you can take Benadryl 25 mg and increase your fluid intake. If you experience trouble breathing, this can be serious. If it is severe call 911 IMMEDIATELY. If it is mild, please call our office. If you take any of these medications: Glipizide/Metformin, Avandament, Glucavance, please do not take 48 hours after completing test unless otherwise instructed.   Once we have confirmed authorization from your insurance company, we will call you to set up a date and time for your test. Based on how quickly your insurance processes prior authorizations requests, please allow up to 4 weeks to be contacted for scheduling your Cardiac CT appointment. Be advised that routine Cardiac CT appointments could be scheduled as many as 8 weeks after your provider has ordered it.  For non-scheduling related questions, please contact the cardiac imaging nurse navigator should you have any questions/concerns: Marchia Bond, Cardiac Imaging Nurse Navigator Gordy Clement, Cardiac Imaging Nurse Navigator [facility] Direct Office Dial: [phone]   For scheduling needs, including cancellations and rescheduling, please call [place], [phone].     Electrophysiology/Ablation Procedure Instructions   You are scheduled for a(n)  ablation on 07/19/2021 with Dr. Allegra Lai.   1.   Pre procedure testing-             A.  LAB WORK --- between 8/29 - 9/9 for your pre procedure blood work.  You do NOT need to be fasting.  You can stop by the [facility] office for this.     On the day of your procedure 07/19/2021 you will go to [facility] [id number]) at 9:30 am.  Dennis Bast will go to the main entrance A [employer]) and enter where the [employer] are.  Your driver will drop you off and you will head down the hallway to ADMITTING.  You may have one support person come in to the hospital with you.  They will be asked to wait in the waiting room. It is OK  to have someone drop you off and come back when you are ready to be discharged.   3.   Do not eat or drink after midnight prior to your procedure.   4.   On the morning of your procedure do NOT take any medication. Do not miss any doses of your blood thinner prior to the morning of your procedure or your procedure will need to be rescheduled.   5.  Plan for an overnight stay but you may be discharged after your procedure, if you use your phone frequently bring your phone charger. If you are discharged after your procedure you will need someone to drive you home and be with you for 24 hours after your procedure.   6. You will follow up with the AFIB clinic 4 weeks after your procedure.  You will follow up with Dr. Curt Bears  3 months after your procedure.  These appointments will be made for you.   7. FYI: For your safety, and to allow [place] to monitor your vital signs accurately during the surgery/procedure we request that  if you have artificial nails, gel coating, SNS etc. Please have those removed prior to your surgery/procedure. Not having the nail coverings /polish removed may result in cancellation or delay of your surgery/procedure.  * If you have ANY questions please call the office [phone] and ask for Dhiren Azimi RN or send me a MyChart message   * Occasionally, EP Studies and ablations can become lengthy.  Please make your family aware of this before your procedure starts.  Average time ranges from 2-8 hours for EP studies/ablations.  Your physician will call your family after the procedure with the results.

## 2021-06-20 ENCOUNTER — Other Ambulatory Visit: Payer: Self-pay

## 2021-06-20 ENCOUNTER — Other Ambulatory Visit: Payer: Self-pay | Admitting: Cardiology

## 2021-06-20 NOTE — Telephone Encounter (Signed)
Prescription refill request for Eliquis received. Indication:afib Last office visit:camnitz 06/13/21 Scr:1.17 05/04/21 Age: 44mWWeight:38.6kg

## 2021-06-23 ENCOUNTER — Ambulatory Visit (HOSPITAL_BASED_OUTPATIENT_CLINIC_OR_DEPARTMENT_OTHER): Payer: Medicare Other | Attending: Internal Medicine | Admitting: Internal Medicine

## 2021-06-23 ENCOUNTER — Other Ambulatory Visit: Payer: Self-pay

## 2021-06-23 VITALS — Ht 74.0 in | Wt 300.0 lb

## 2021-06-23 DIAGNOSIS — G4733 Obstructive sleep apnea (adult) (pediatric): Secondary | ICD-10-CM | POA: Diagnosis not present

## 2021-06-23 DIAGNOSIS — R0683 Snoring: Secondary | ICD-10-CM

## 2021-06-23 DIAGNOSIS — R5383 Other fatigue: Secondary | ICD-10-CM

## 2021-06-26 ENCOUNTER — Other Ambulatory Visit: Payer: Self-pay

## 2021-06-26 ENCOUNTER — Ambulatory Visit: Payer: Medicare Other | Admitting: Cardiology

## 2021-06-28 DIAGNOSIS — R0683 Snoring: Secondary | ICD-10-CM | POA: Diagnosis not present

## 2021-06-28 NOTE — Procedures (Signed)
Patient Name: Sean [name], Sean [name] Date: 06/23/2021 Gender: Male D.O.B: Feb 27, 1951 Age (years): 70 Referring Provider: [name] MD Height (inches): 74 Interpreting Physician: Baird Lyons MD, ABSM Weight (lbs): 300 RPSGT: Zadie Rhine BMI: 39 MRN: [id number] Neck Size: 17.00  CLINICAL INFORMATION The patient is referred for a CPAP titration to treat sleep apnea.  Date of NPSG, Split Night or HST:  SLEEP STUDY TECHNIQUE As per the AASM Manual for the Scoring of Sleep and Associated Events v2.3 (April 2016) with a hypopnea requiring 4% desaturations.  The channels recorded and monitored were frontal, central and occipital EEG, electrooculogram (EOG), submentalis EMG (chin), nasal and oral airflow, thoracic and abdominal wall motion, anterior tibialis EMG, snore microphone, electrocardiogram, and pulse oximetry. Continuous positive airway pressure (CPAP) was initiated at the beginning of the study and titrated to treat sleep-disordered breathing.  MEDICATIONS Medications self-administered by patient taken the night of the study : COSAMIN DS, DILTIAZEM HCL, eliquis, entresto, eszopiclone, FISH OIL, NEURONTIN, toprol-xl, TOPROL XL, SINGULAIR, PRILOSEC, REQUIP, FLOMAX, turmeric po, vit-c, DRISDOL, vit d3  TECHNICIAN COMMENTS Comments added by technician: pt had two restroom visted. Patient had difficulty initiating sleep. Patient was restless all through the night. Comments added by scorer: N/A RESPIRATORY PARAMETERS Optimal PAP Pressure (cm): 16 AHI at Optimal Pressure (/hr): 7.9 Overall Minimal O2 (%): 89.0 Supine % at Optimal Pressure (%): 100 Minimal O2 at Optimal Pressure (%): 89.0   SLEEP ARCHITECTURE The study was initiated at 9:38:10 PM and ended at 5:14:40 AM.  Sleep onset time was 170.7 minutes and the sleep efficiency was 11.9%%. The total sleep time was 54.5 minutes.  The patient spent 33.0%% of the night in stage N1 sleep, 67.0%% in stage N2 sleep, 0.0%% in stage  N3 and 0% in REM.Stage REM latency was N/A minutes  Wake after sleep onset was 231.3. Alpha intrusion was absent. Supine sleep was 33.94%.  CARDIAC DATA The 2 lead EKG demonstrated sinus rhythm. The mean heart rate was 70.5 beats per minute. Other EKG findings include: PVCs.  LEG MOVEMENT DATA The total Periodic Limb Movements of Sleep (PLMS) were 0. The PLMS index was 0.0. A PLMS index of <15 is considered normal in adults.  IMPRESSIONS - The optimal PAP pressure was 16 cm of water. - Mild oxygen desaturations were observed during this titration (min O2 = 89.0%). Mean O2 saturation on CPAP 16 was 91.9%. - The patient snored with moderate snoring volume during this titration study. - 2-lead EKG demonstrated: PVCs - Clinically significant periodic limb movements were not noted during this study.  - Patient had significant difficulty inititing and maintaining sleep in unfamiliar environment.  DIAGNOSIS - Obstructive Sleep Apnea (G47.33)  RECOMMENDATIONS - Trial of CPAP therapy on 16 cm H2O or autopap 10-20. - Patient used a Large size Resmed Full Face Mask AirFit F20 mask and heated humidification. - Be careful with alcohol, sedatives and other CNS depressants that may worsen sleep apnea and disrupt normal sleep architecture. - Sleep hygiene should be reviewed to assess factors that may improve sleep quality. - Weight management and regular exercise should be initiated or continued.  [Electronically signed] 06/28/2021 02:06 PM  Baird Lyons MD, [place], American Board of Sleep Medicine   NPI: [id number]                          [place], [place] of Sleep Medicine  ELECTRONICALLY SIGNED ON:  06/28/2021, 2:02 PM [facility] PH: [phone]   FX: [phone] [place]

## 2021-07-04 ENCOUNTER — Ambulatory Visit (INDEPENDENT_AMBULATORY_CARE_PROVIDER_SITE_OTHER): Payer: Medicare Other | Admitting: Sports Medicine

## 2021-07-04 ENCOUNTER — Other Ambulatory Visit: Payer: Self-pay

## 2021-07-04 ENCOUNTER — Ambulatory Visit (INDEPENDENT_AMBULATORY_CARE_PROVIDER_SITE_OTHER): Payer: Medicare Other

## 2021-07-04 ENCOUNTER — Encounter: Payer: Self-pay | Admitting: Sports Medicine

## 2021-07-04 DIAGNOSIS — S90111D Contusion of right great toe without damage to nail, subsequent encounter: Secondary | ICD-10-CM | POA: Diagnosis not present

## 2021-07-04 DIAGNOSIS — S92401D Displaced unspecified fracture of right great toe, subsequent encounter for fracture with routine healing: Secondary | ICD-10-CM

## 2021-07-04 DIAGNOSIS — M79671 Pain in right foot: Secondary | ICD-10-CM | POA: Diagnosis not present

## 2021-07-04 DIAGNOSIS — S92401A Displaced unspecified fracture of right great toe, initial encounter for closed fracture: Secondary | ICD-10-CM | POA: Diagnosis not present

## 2021-07-04 DIAGNOSIS — E088 Diabetes mellitus due to underlying condition with unspecified complications: Secondary | ICD-10-CM

## 2021-07-04 DIAGNOSIS — S90111A Contusion of right great toe without damage to nail, initial encounter: Secondary | ICD-10-CM | POA: Diagnosis not present

## 2021-07-04 NOTE — Progress Notes (Signed)
Subjective: Sean [name]. is a 70 y.o. male patient who presents to office for follow up evaluation of Right great toe pain/fracture.  States no pain at all on toe. Patient is diabetic with blood sugar on yesterday 102 and last A1c unknown.  Will be having ablation 9/21  Was recently diagnosed with cracked vertebrae in back  Patient Active Problem List   Diagnosis Date Noted   Atrial flutter ([place])    Melena 08/03/2020   Barrett's esophagus [id number]   Nonalcoholic fatty liver disease 08/03/2020   History of colonic polyps 08/03/2020   Submucosal lesion of stomach 08/03/2020   PAF (paroxysmal atrial fibrillation) ([place]) 02/23/2020   Cardiomyopathy ([place]) 02/23/2020   S/P ablation of atrial fibrillation 02/23/2020   Hypogonadism in male 02/22/2020   Impotence 02/22/2020   Hemospermia 02/22/2020   Incomplete emptying of bladder 02/22/2020   Secondary hypercoagulable state ([place]) 01/22/2020   Upper airway cough syndrome 12/15/2019   DOE (dyspnea on exertion) 10/29/2019   Essential hypertension 09/29/2019   Mixed dyslipidemia 09/29/2019   Diabetes mellitus due to underlying condition with unspecified complications ([place]) [id number]   History of pulmonary embolism 09/29/2019   Morbid obesity ([place]) 09/29/2019   Benign prostatic hyperplasia with urinary obstruction 11/29/2018   GERD (gastroesophageal reflux disease) 11/29/2018   IBS (irritable bowel syndrome) 11/29/2018   Fissure in skin of foot 12/11/2016   Overgrown toenails 12/11/2016   Plantar fasciitis, bilateral 06/05/2016    Current Outpatient Medications on File Prior to Visit  Medication Sig Dispense Refill   albuterol (PROVENTIL) (2.5 MG/3ML) 0.083% nebulizer solution Take 2.5 mg by nebulization every 6 (six) hours as needed for wheezing or shortness of breath.      albuterol (VENTOLIN HFA) 108 (90 Base) MCG/ACT inhaler Inhale 2 puffs into the lungs every 6 (six) hours as needed for wheezing or shortness of breath.       allopurinol (ZYLOPRIM) 100 MG tablet Take 100 mg by mouth in the morning.     Ascorbic Acid (VITAMIN C) 1000 MG tablet Take 1,000 mg by mouth 2 (two) times daily.     atorvastatin (LIPITOR) 20 MG tablet Take 1 tablet (20 mg total) by mouth in the morning. 90 tablet 1   Cholecalciferol (VITAMIN D3) 50 MCG (2000 UT) TABS Take 2,000 Units by mouth 2 (two) times daily.      diltiazem (CARDIZEM CD) 180 MG 24 hr capsule Take 1 capsule (180 mg total) by mouth daily. 30 capsule 3   ELIQUIS 5 MG TABS tablet Take 1 tablet (5 mg total) by mouth 2 (two) times daily. 180 tablet 1   empagliflozin (JARDIANCE) 25 MG TABS tablet Take 25 mg by mouth in the morning.     Eszopiclone 3 MG TABS Take 3 mg by mouth at bedtime.     fluticasone (FLONASE) 50 MCG/ACT nasal spray Place 2 sprays into both nostrils daily as needed for allergies or rhinitis.     furosemide (LASIX) 40 MG tablet Take 40 mg by mouth as needed.     gabapentin (NEURONTIN) 300 MG capsule Take 300 mg by mouth at bedtime.     Glucosamine-Chondroitin (COSAMIN DS PO) Take 1 tablet by mouth in the morning and at bedtime.     hydrALAZINE (APRESOLINE) 50 MG tablet Take 50 mg by mouth every 8 (eight) hours.     insulin glargine, 1 Unit Dial, (TOUJEO SOLOSTAR) 300 UNIT/ML Solostar Pen Inject 94 Units into the skin at bedtime.     insulin lispro (HUMALOG)  100 UNIT/ML injection Inject 0-10 Units into the skin 3 (three) times daily as needed for high blood sugar. Sliding Scale     metoprolol succinate (TOPROL-XL) 100 MG 24 hr tablet Take 100 mg by mouth at bedtime.     metoprolol succinate (TOPROL-XL) 50 MG 24 hr tablet Take 1 tablet (50 mg total) by mouth daily. Take with or immediately following a meal. 90 tablet 2   montelukast (SINGULAIR) 10 MG tablet Take 10 mg by mouth at bedtime.     Omega-3 Fatty Acids (FISH OIL) 1200 MG CAPS Take 1,200 mg by mouth 2 (two) times daily.      omeprazole (PRILOSEC) 40 MG capsule Take 40 mg by mouth 2 (two) times daily.       rOPINIRole (REQUIP) 2 MG tablet Take 2 mg by mouth at bedtime.     sacubitril-valsartan (ENTRESTO) 24-26 MG Take 1 tablet by mouth 2 (two) times daily. 60 tablet 11   tamsulosin (FLOMAX) 0.4 MG CAPS capsule Take 0.4 capsules by mouth 2 (two) times daily.     traMADol (ULTRAM) 50 MG tablet Take 50 mg by mouth daily as needed for moderate pain.      TURMERIC PO Take 1,000 mg by mouth in the morning and at bedtime.     venlafaxine XR (EFFEXOR-XR) 150 MG 24 hr capsule Take 150 mg by mouth in the morning.     Vitamin D, Ergocalciferol, (DRISDOL) 1.25 MG (50000 UT) CAPS capsule Take 50,000 Units by mouth every Tuesday. IN THE MORNING     No current facility-administered medications on file prior to visit.    Allergies  Allergen Reactions   Prednisone Other (See Comments)    nervous   Victoza [Liraglutide] Diarrhea    Objective:  General: Alert and oriented x3 in no acute distress  Dermatology: No open lesions bilateral lower extremities, no webspace macerations, Right hallux nail healing well with no signs of infection. No lysis.   Vascular:Resolved edema noted to right great toe.  DP and PT pedal pulses faintly palpable 1 out of 4 bilateral with chronic edema noted to bilateral ankles, Capillary Fill Time 3 seconds,(+) pedal hair growth bilateral, Temperature gradient within normal limits.  Neurology: Gross sensation intact via light touch bilateral.  Protective sensation intact to toes with Semmes Weinstein monofilament.  Musculoskeletal: There is no tenderness to palpation at the right hallux IPJ.  There is no obvious digital deformity, no limitation with range of motion to the first toe on right.  Xrays  Right foot   Impression: There is diffuse arthritis however at the right hallux IPJ lateral margin there is a small questionable chip fracture at the joint line of the distal interphalangeal joint.  No other acute fractures noted the overall positioning of the first toe acceptable  alignment for conservative healing of this fracture.    Assessment and Plan: Problem List Items Addressed This Visit       Endocrine   Diabetes mellitus due to underlying condition with unspecified complications ([place])   Other Visit Diagnoses     Contusion of right great toe without damage to nail, subsequent encounter    -  Primary   Relevant Orders   DG Foot Complete Right   Closed non-physeal fracture of phalanx of right great toe with routine healing, unspecified phalanx, subsequent encounter       Relevant Orders   DG Foot Complete Right   Right foot pain            -  Complete examination performed -Xrays reviewed -Re-Discussed treatement options for fracture; risks, alternatives, and benefits explained. -May discontinue antibiotic cream since abrasion is healed -Continue with surgical shoe for 2 more weeks and thenafter may wean as directed to a normal shoe -Recommend protection, rest, ice, elevation daily until symptoms improve -Patient to return to office in 4-6 weeks for final xray or sooner if condition worsens.  Landis Martins, DPM

## 2021-07-07 DIAGNOSIS — G4700 Insomnia, unspecified: Secondary | ICD-10-CM | POA: Diagnosis not present

## 2021-07-07 DIAGNOSIS — J309 Allergic rhinitis, unspecified: Secondary | ICD-10-CM | POA: Diagnosis not present

## 2021-07-07 DIAGNOSIS — I10 Essential (primary) hypertension: Secondary | ICD-10-CM | POA: Diagnosis not present

## 2021-07-07 DIAGNOSIS — I4891 Unspecified atrial fibrillation: Secondary | ICD-10-CM | POA: Diagnosis not present

## 2021-07-07 DIAGNOSIS — G4730 Sleep apnea, unspecified: Secondary | ICD-10-CM | POA: Diagnosis not present

## 2021-07-07 DIAGNOSIS — E1121 Type 2 diabetes mellitus with diabetic nephropathy: Secondary | ICD-10-CM | POA: Diagnosis not present

## 2021-07-10 ENCOUNTER — Other Ambulatory Visit: Payer: Self-pay

## 2021-07-10 DIAGNOSIS — Z01812 Encounter for preprocedural laboratory examination: Secondary | ICD-10-CM

## 2021-07-10 DIAGNOSIS — I484 Atypical atrial flutter: Secondary | ICD-10-CM

## 2021-07-10 DIAGNOSIS — I4819 Other persistent atrial fibrillation: Secondary | ICD-10-CM

## 2021-07-11 ENCOUNTER — Telehealth (HOSPITAL_COMMUNITY): Payer: Self-pay | Admitting: Emergency Medicine

## 2021-07-11 LAB — BASIC METABOLIC PANEL
BUN/Creatinine Ratio: 13 (ref 10–24)
BUN: 15 mg/dL (ref 8–27)
CO2: 26 mmol/L (ref 20–29)
Calcium: 8.9 mg/dL (ref 8.6–10.2)
Chloride: 102 mmol/L (ref 96–106)
Creatinine, Ser: 1.18 mg/dL (ref 0.76–1.27)
Glucose: 110 mg/dL — ABNORMAL HIGH (ref 65–99)
Potassium: 4.2 mmol/L (ref 3.5–5.2)
Sodium: 141 mmol/L (ref 134–144)
eGFR: 66 mL/min/{1.73_m2} (ref >59)

## 2021-07-11 LAB — CBC
Hematocrit: 45 % (ref 37.5–51.0)
Hemoglobin: 14.8 g/dL (ref 13.0–17.7)
MCH: 30 pg (ref 26.6–33.0)
MCHC: 32.9 g/dL (ref 31.5–35.7)
MCV: 91 fL (ref 79–97)
Platelets: 187 10*3/uL (ref 150–450)
RBC: 4.93 x10E6/uL (ref 4.14–5.80)
RDW: 15.2 % (ref 11.6–15.4)
WBC: 5.7 10*3/uL (ref 3.4–10.8)

## 2021-07-11 NOTE — Telephone Encounter (Signed)
Reaching out to patient to offer assistance regarding upcoming cardiac imaging study; pt verbalizes understanding of appt date/time, parking situation and where to check in, pre-test NPO status and medications ordered, and verified current allergies; name and call back number provided for further questions should they arise Marchia Bond RN Navigator Cardiac Imaging [facility] [phone] office [phone] cell  Denies iv issues Denies claustro '50mg'$  metop succ qd '100mg'$  metop succ hs '180mg'$  diltiazem (new rx)

## 2021-07-13 ENCOUNTER — Other Ambulatory Visit: Payer: Self-pay

## 2021-07-13 ENCOUNTER — Ambulatory Visit (HOSPITAL_COMMUNITY)
Admission: RE | Admit: 2021-07-13 | Discharge: 2021-07-13 | Disposition: A | Discharge status: Home / Self Care | Payer: Medicare Other | Source: Ambulatory Visit | Attending: Cardiology | Admitting: Cardiology

## 2021-07-13 DIAGNOSIS — I4819 Other persistent atrial fibrillation: Secondary | ICD-10-CM | POA: Insufficient documentation

## 2021-07-13 MED ORDER — IOHEXOL 350 MG/ML SOLN
100.0000 mL | Freq: Once | INTRAVENOUS | Status: AC | PRN
  Administered 2021-07-13: 100 mL via INTRAVENOUS

## 2021-07-17 ENCOUNTER — Telehealth: Payer: Self-pay | Admitting: Internal Medicine

## 2021-07-18 ENCOUNTER — Ambulatory Visit (INDEPENDENT_AMBULATORY_CARE_PROVIDER_SITE_OTHER): Payer: Medicare Other | Admitting: Internal Medicine

## 2021-07-18 ENCOUNTER — Other Ambulatory Visit: Payer: Self-pay

## 2021-07-18 ENCOUNTER — Encounter: Payer: Self-pay | Admitting: Internal Medicine

## 2021-07-18 VITALS — BP 116/62 | HR 58 | Temp 97.6°F | Ht 74.0 in | Wt 311.2 lb

## 2021-07-18 DIAGNOSIS — G4733 Obstructive sleep apnea (adult) (pediatric): Secondary | ICD-10-CM | POA: Insufficient documentation

## 2021-07-18 DIAGNOSIS — I480 Paroxysmal atrial fibrillation: Secondary | ICD-10-CM

## 2021-07-18 NOTE — Progress Notes (Signed)
07/18/21- 74 yoM never smoker for sleep evaluation courtesy of Dr [name] with concern of OSA Medical problem list includes OSA, AFib, HTN, CHF, CM, Allergic Rhinitis, NASH, GERD/ Barretts, DM2, BPH, Morbid Obesity, Covid infection 2022,  Original diagnostic NPSG years ago in [place], [place]. NPSG 06/23/21- CPAP titrated to 16, body weight 300 lbs. Titration started at 13 cwp      ( AHI 16/ hr). Minimum O2 saturation on 16 cwp was 89%. Meds include Neb albuterol, Ventolin hfa Epworth score- Body weight today-311 lbs Covid vax-3 Moderna Flu vax-had -----OSA, review sleep study, uses cpap nightly CPAP--  auto 5-20 Dream Station 2/ [employer]   AHI 18 He appears to be having significant breakthrough events, but need updated download. He has been using every night. Nasal mask. He admits weight gain. Not sleeping well pending repeat cardiac ablation tomorrow.   ENT surgery limited to sinus surgery.    CXR 04/12/21-  IMPRESSION: Possible mild pulmonary vascular congestion. PFT Oval Linsey) 10/08/19- minimal obstruction, no response to BD, moderate Diffusion deficit  Prior to Admission medications   Medication Sig Start Date End Date Taking? Authorizing Provider  albuterol (PROVENTIL) (2.5 MG/3ML) 0.083% nebulizer solution Take 2.5 mg by nebulization every 6 (six) hours as needed for wheezing or shortness of breath.    Yes [provider]  albuterol (VENTOLIN HFA) 108 (90 Base) MCG/ACT inhaler Inhale 2 puffs into the lungs every 6 (six) hours as needed for wheezing or shortness of breath.    Yes [provider]  allopurinol (ZYLOPRIM) 100 MG tablet Take 100 mg by mouth in the morning.   Yes [provider]  Ascorbic Acid (VITAMIN C) 1000 MG tablet Take 1,000 mg by mouth 2 (two) times daily.   Yes [provider]  atorvastatin (LIPITOR) 20 MG tablet Take 1 tablet (20 mg total) by mouth in the morning. 06/05/21 07/14/22 Yes Camnitz, Will Hassell Done, MD  Cholecalciferol  (VITAMIN D3) 50 MCG (2000 UT) TABS Take 2,000 Units by mouth 2 (two) times daily.    Yes [provider]  diltiazem (CARDIZEM CD) 180 MG 24 hr capsule Take 1 capsule (180 mg total) by mouth daily. Patient taking differently: Take 180 mg by mouth at bedtime. 06/13/21  Yes Camnitz, Will Hassell Done, MD  ELIQUIS 5 MG TABS tablet Take 1 tablet (5 mg total) by mouth 2 (two) times daily. 06/20/21  Yes Camnitz, Will Hassell Done, MD  empagliflozin (JARDIANCE) 25 MG TABS tablet Take 25 mg by mouth in the morning.   Yes [provider]  Eszopiclone 3 MG TABS Take 3 mg by mouth at bedtime. 06/05/21  Yes [provider]  fluticasone (FLONASE) 50 MCG/ACT nasal spray Place 2 sprays into both nostrils daily as needed for allergies or rhinitis.   Yes [provider]  furosemide (LASIX) 40 MG tablet Take 40 mg by mouth daily as needed for fluid. 12/15/19  Yes [provider]  gabapentin (NEURONTIN) 300 MG capsule Take 300 mg by mouth at bedtime. 05/22/21  Yes [provider]  Glucosamine-Chondroitin (COSAMIN DS PO) Take 1 tablet by mouth in the morning and at bedtime.   Yes [provider]  insulin lispro (HUMALOG) 100 UNIT/ML injection Inject 0-10 Units into the skin 3 (three) times daily as needed for high blood sugar. Sliding Scale   Yes [provider]  metoprolol succinate (TOPROL-XL) 100 MG 24 hr tablet Take 100 mg by mouth at bedtime. 06/05/21  Yes [provider]  metoprolol succinate (TOPROL-XL)  50 MG 24 hr tablet Take 1 tablet (50 mg total) by mouth daily. Take with or immediately following a meal. 06/07/21  Yes Fenton, Clint R, PA  montelukast (SINGULAIR) 10 MG tablet Take 10 mg by mouth at bedtime.   Yes [provider]  Omega-3 Fatty Acids (FISH OIL) 1200 MG CAPS Take 1,200 mg by mouth 2 (two) times daily.    Yes [provider]  omeprazole (PRILOSEC) 40 MG capsule Take 40 mg by mouth 2 (two) times daily.    Yes [provider]  rOPINIRole (REQUIP) 2 MG tablet Take 2 mg by mouth at bedtime.   Yes [provider]  sacubitril-valsartan (ENTRESTO) 24-26 MG Take 1 tablet by mouth 2 (two) times daily. 04/13/21  Yes Furth, Cadence H, PA-C  tamsulosin (FLOMAX) 0.4 MG CAPS capsule Take 0.4 capsules by mouth 2 (two) times daily.   Yes [provider]  TOUJEO MAX SOLOSTAR 300 UNIT/ML Solostar Pen Inject 94 Units into the skin at bedtime. 07/07/21  Yes [provider]  traMADol (ULTRAM) 50 MG tablet Take 50 mg by mouth daily as needed for moderate pain.  02/10/20  Yes [provider]  TURMERIC PO Take 1,000 mg by mouth in the morning and at bedtime.   Yes [provider]  venlafaxine XR (EFFEXOR-XR) 150 MG 24 hr capsule Take 150 mg by mouth in the morning.   Yes [provider]  Vitamin D, Ergocalciferol, (DRISDOL) 1.25 MG (50000 UT) CAPS capsule Take 50,000 Units by mouth every Tuesday. IN THE MORNING   Yes [provider]   Past Medical History:  Diagnosis Date   Allergic rhinitis    Arrhythmia    Atrial fibrillation ([place])    Benign prostatic hyperplasia with urinary obstruction 11/29/2018   CHF (congestive heart failure) ([place])    Diabetes mellitus due to underlying condition with unspecified complications ([place]) 17/0/0174   Diabetes mellitus without complication ([facility])    DOE (dyspnea on exertion) 10/29/2019   S/p PE  11/2018 > DOAC recurrent 09/14/2019 off DOAC so resumed  - onset variable doe and orthostatic lightheadedness 06/2019 with afib - PFT's Oval Linsey  10/08/2019 :  FEV1 2.79 (73%) with ratio 75 and 6 % better p saba,  Air trapping on lung vol,  ERV 14% and dlco 22.78 (60%) corrects to 3.68 (76%) for vol with minimal concavity to f/v loop  - 10/29/2019   Walked RA x two laps =  approx 582ft @ avg   Essential hypertension 09/29/2019   Fissure in skin of foot 12/11/2016   GERD (gastroesophageal reflux disease) 11/29/2018   Hemospermia 02/22/2020    History of pulmonary embolism 09/29/2019   Hyperlipidemia    Hypertension    Hypogonadism in male 02/22/2020   IBS (irritable bowel syndrome) 11/29/2018   Impotence 02/22/2020   Incomplete emptying of bladder 02/22/2020   Mixed dyslipidemia 09/29/2019   Morbid obesity ([place]) 09/29/2019   OSA on CPAP    Overgrown toenails 12/11/2016   Persistent atrial fibrillation ([place]) 09/29/2019   Plantar fasciitis, bilateral 06/05/2016   Secondary hypercoagulable state ([place]) 01/22/2020   Upper airway cough syndrome 12/15/2019   Noted on exam 12/15/2019 onset while on Entresto, resolved with purse lip   Past Surgical History:  Procedure Laterality Date   ATRIAL FIBRILLATION ABLATION N/A 12/25/2019   Procedure: ATRIAL FIBRILLATION ABLATION;  Surgeon: [name], MD;  Location: [facility];  Service: Cardiovascular;  Laterality: N/A;   BIOPSY  08/11/2020  Procedure: BIOPSY;  Surgeon: [name], MD;  Location: [facility];  Service: Endoscopy;;   CARDIAC CATHETERIZATION     CARDIOVERSION N/A 04/17/2021   Procedure: CARDIOVERSION;  Surgeon: [name], MD;  Location: [facility];  Service: Cardiovascular;  Laterality: N/A;   ESOPHAGOGASTRODUODENOSCOPY (EGD) WITH PROPOFOL N/A 08/11/2020   Procedure: ESOPHAGOGASTRODUODENOSCOPY (EGD) WITH PROPOFOL;  Surgeon: [name], MD;  Location: [facility];  Service: Endoscopy;  Laterality: N/A;   KNEE SURGERY     PILONIDAL CYST / SINUS EXCISION     UPPER ESOPHAGEAL ENDOSCOPIC ULTRASOUND (EUS) N/A 08/11/2020   Procedure: UPPER ESOPHAGEAL ENDOSCOPIC ULTRASOUND (EUS);  Surgeon: [name], MD;  Location: [facility];  Service: Endoscopy;  Laterality: N/A;   Family History  Problem Relation Age of Onset   Hyperlipidemia Father    Hypertension Father    Colon cancer Father    Liver cancer Father    Lung cancer Father    Social History   Socioeconomic History   Marital status: Married    Spouse name: Not on file   Number of  children: Not on file   Years of education: Not on file   Highest education level: Not on file  Occupational History   Not on file  Tobacco Use   Smoking status: Never   Smokeless tobacco: Never  Vaping Use   Vaping Use: Never used  Substance and Sexual Activity   Alcohol use: Not Currently   Drug use: Never   Sexual activity: Not on file  Other Topics Concern   Not on file  Social History Narrative   Not on file   Social Determinants of Health   Financial Resource Strain: Not on file  Food Insecurity: Not on file  Transportation Needs: Not on file  Physical Activity: Not on file  Stress: Not on file  Social Connections: Not on file  Intimate Partner Violence: Not on file   ROS-see HPI   + = positive Constitutional:    weight loss, night sweats, fevers, chills, fatigue, lassitude. HEENT:    headaches, difficulty swallowing, tooth/dental problems, sore throat,       sneezing, itching, ear ache, nasal congestion, post nasal drip, snoring CV:    chest pain, orthopnea, PND, swelling in lower extremities, anasarca,                                   dizziness, palpitations Resp:   +shortness of breath with exertion or at rest.                productive cough,   non-productive cough, coughing up of blood.              change in color of mucus.  wheezing.   Skin:    rash or lesions. GI:  No-   heartburn, indigestion, abdominal pain, nausea, vomiting, diarrhea,                 change in bowel habits, loss of appetite GU: dysuria, change in color of urine, no urgency or frequency.   flank pain. MS:   joint pain, stiffness, decreased range of motion, back pain. Neuro-     nothing unusual Psych:  change in mood or affect.  depression or anxiety.   memory loss.  OBJ- Physical Exam General- Alert, Oriented, Affect-appropriate, Distress- none acute, + obese Skin- rash-none, lesions- none, excoriation- none Lymphadenopathy- none Head- atraumatic  Eyes- Gross vision  intact, PERRLA, conjunctivae and secretions clear            Ears- Hearing, canals-normal            Nose- Clear, no-Septal dev, mucus, polyps, erosion, perforation             Throat- Mallampati III-IV , mucosa clear , drainage- none, tonsils- atrophic Neck- flexible , trachea midline, no stridor , thyroid nl, carotid no bruit Chest - symmetrical excursion , unlabored           Heart/CV- IRR/ AFib , no murmur , no gallop  , no rub, nl s1 s2                           - JVD- none , edema- none, stasis changes- none, varices- none           Lung- clear to P&A, wheeze- none, cough- none , dullness-none, rub- none           Chest wall-  Abd-  Br/ Gen/ Rectal- Not done, not indicated Extrem- cyanosis- none, clubbing, none, atrophy- none, strength- nl Neuro- grossly intact to observation

## 2021-07-18 NOTE — Assessment & Plan Note (Signed)
Refractory. Managed by cardiology with ablation pending.

## 2021-07-18 NOTE — Assessment & Plan Note (Signed)
Significant weight loss should be a long-term goal.

## 2021-07-18 NOTE — Assessment & Plan Note (Addendum)
He has clearly benefited and been compliant with CPAP. He has replacement Dream Station. We will establish with his DME for management, set auto range at 10-20 for now, and get download. We may beed to consider HST to document baseline. May need to consider BIPAP titration if residual AHI is high.

## 2021-07-18 NOTE — Telephone Encounter (Signed)
Patient is in office to see CY. Nothing further needed.

## 2021-07-18 NOTE — Patient Instructions (Addendum)
Order- DME [employer]   please set CPAP to auto 10-20, mask  of choice, humidifier, supplies, AirView/ [employer]/ card  Please send current download.  Please call if we can help

## 2021-07-19 ENCOUNTER — Other Ambulatory Visit: Payer: Self-pay

## 2021-07-19 ENCOUNTER — Ambulatory Visit (HOSPITAL_COMMUNITY): Payer: Medicare Other | Admitting: Anesthesiology

## 2021-07-19 ENCOUNTER — Encounter (HOSPITAL_COMMUNITY)
Admission: RE | Disposition: A | Discharge status: Home / Self Care | Payer: Self-pay | Source: Home / Self Care | Attending: Cardiology

## 2021-07-19 ENCOUNTER — Encounter (HOSPITAL_COMMUNITY): Payer: Self-pay | Admitting: Cardiology

## 2021-07-19 ENCOUNTER — Ambulatory Visit (HOSPITAL_COMMUNITY)
Admission: RE | Admit: 2021-07-19 | Discharge: 2021-07-20 | Disposition: A | Discharge status: Home / Self Care | Payer: Medicare Other | Attending: Cardiology | Admitting: Cardiology

## 2021-07-19 DIAGNOSIS — I428 Other cardiomyopathies: Secondary | ICD-10-CM | POA: Diagnosis not present

## 2021-07-19 DIAGNOSIS — I5022 Chronic systolic (congestive) heart failure: Secondary | ICD-10-CM | POA: Diagnosis not present

## 2021-07-19 DIAGNOSIS — I4891 Unspecified atrial fibrillation: Secondary | ICD-10-CM | POA: Diagnosis present

## 2021-07-19 DIAGNOSIS — I110 Hypertensive heart disease with heart failure: Secondary | ICD-10-CM | POA: Insufficient documentation

## 2021-07-19 DIAGNOSIS — E782 Mixed hyperlipidemia: Secondary | ICD-10-CM | POA: Insufficient documentation

## 2021-07-19 DIAGNOSIS — Z794 Long term (current) use of insulin: Secondary | ICD-10-CM | POA: Insufficient documentation

## 2021-07-19 DIAGNOSIS — I480 Paroxysmal atrial fibrillation: Secondary | ICD-10-CM | POA: Diagnosis not present

## 2021-07-19 DIAGNOSIS — Z79899 Other long term (current) drug therapy: Secondary | ICD-10-CM | POA: Insufficient documentation

## 2021-07-19 DIAGNOSIS — E119 Type 2 diabetes mellitus without complications: Secondary | ICD-10-CM | POA: Insufficient documentation

## 2021-07-19 DIAGNOSIS — Z8249 Family history of ischemic heart disease and other diseases of the circulatory system: Secondary | ICD-10-CM | POA: Insufficient documentation

## 2021-07-19 DIAGNOSIS — Z6839 Body mass index (BMI) 39.0-39.9, adult: Secondary | ICD-10-CM | POA: Insufficient documentation

## 2021-07-19 DIAGNOSIS — Z7901 Long term (current) use of anticoagulants: Secondary | ICD-10-CM | POA: Diagnosis not present

## 2021-07-19 DIAGNOSIS — Z86711 Personal history of pulmonary embolism: Secondary | ICD-10-CM | POA: Diagnosis not present

## 2021-07-19 DIAGNOSIS — I4819 Other persistent atrial fibrillation: Secondary | ICD-10-CM | POA: Insufficient documentation

## 2021-07-19 DIAGNOSIS — Z9989 Dependence on other enabling machines and devices: Secondary | ICD-10-CM | POA: Diagnosis not present

## 2021-07-19 DIAGNOSIS — G4733 Obstructive sleep apnea (adult) (pediatric): Secondary | ICD-10-CM | POA: Diagnosis not present

## 2021-07-19 DIAGNOSIS — Z8616 Personal history of COVID-19: Secondary | ICD-10-CM | POA: Diagnosis not present

## 2021-07-19 DIAGNOSIS — I4892 Unspecified atrial flutter: Secondary | ICD-10-CM | POA: Insufficient documentation

## 2021-07-19 DIAGNOSIS — Z888 Allergy status to other drugs, medicaments and biological substances status: Secondary | ICD-10-CM | POA: Diagnosis not present

## 2021-07-19 HISTORY — PX: ATRIAL FIBRILLATION ABLATION: EP1191

## 2021-07-19 LAB — GLUCOSE, CAPILLARY
Glucose-Capillary: 127 mg/dL — ABNORMAL HIGH (ref 70–99)
Glucose-Capillary: 93 mg/dL (ref 70–99)
Glucose-Capillary: 207 mg/dL — ABNORMAL HIGH (ref 70–99)
Glucose-Capillary: 289 mg/dL — ABNORMAL HIGH (ref 70–99)

## 2021-07-19 LAB — POCT ACTIVATED CLOTTING TIME
Activated Clotting Time: 242 seconds
Activated Clotting Time: 277 seconds

## 2021-07-19 SURGERY — ATRIAL FIBRILLATION ABLATION
Anesthesia: General

## 2021-07-19 MED ORDER — HEPARIN (PORCINE) IN NACL 1000-0.9 UT/500ML-% IV SOLN
INTRAVENOUS | Status: DC | PRN
  Administered 2021-07-19 (×5): 500 mL

## 2021-07-19 MED ORDER — METOPROLOL SUCCINATE ER 100 MG PO TB24: 100.0000 mg | ORAL_TABLET | Freq: Every day | ORAL | Status: DC

## 2021-07-19 MED ORDER — APIXABAN 5 MG PO TABS
5.0000 mg | ORAL_TABLET | Freq: Two times a day (BID) | ORAL | Status: DC
  Administered 2021-07-19 – 2021-07-20 (×2): 5 mg via ORAL
  Filled 2021-07-19 (×2): qty 1

## 2021-07-19 MED ORDER — SODIUM CHLORIDE 0.9% FLUSH: 3.0000 mL | INTRAVENOUS | Status: DC | PRN

## 2021-07-19 MED ORDER — ALBUTEROL SULFATE HFA 108 (90 BASE) MCG/ACT IN AERS: 2.0000 | INHALATION_SPRAY | Freq: Four times a day (QID) | RESPIRATORY_TRACT | Status: DC | PRN

## 2021-07-19 MED ORDER — PHENYLEPHRINE 40 MCG/ML (10ML) SYRINGE FOR IV PUSH (FOR BLOOD PRESSURE SUPPORT)
PREFILLED_SYRINGE | INTRAVENOUS | Status: DC | PRN
  Administered 2021-07-19: 120 ug via INTRAVENOUS
  Administered 2021-07-19: 80 ug via INTRAVENOUS

## 2021-07-19 MED ORDER — GABAPENTIN 300 MG PO CAPS
300.0000 mg | ORAL_CAPSULE | Freq: Every day | ORAL | Status: DC
  Administered 2021-07-19: 300 mg via ORAL
  Filled 2021-07-19: qty 1

## 2021-07-19 MED ORDER — ROPINIROLE HCL 1 MG PO TABS
2.0000 mg | ORAL_TABLET | Freq: Every day | ORAL | Status: DC
  Administered 2021-07-19: 2 mg via ORAL
  Filled 2021-07-19: qty 4
  Filled 2021-07-19: qty 2

## 2021-07-19 MED ORDER — HEPARIN SODIUM (PORCINE) 1000 UNIT/ML IJ SOLN
INTRAMUSCULAR | Status: AC
  Filled 2021-07-19: qty 1

## 2021-07-19 MED ORDER — LIDOCAINE 2% (20 MG/ML) 5 ML SYRINGE
INTRAMUSCULAR | Status: DC | PRN
Start: 2021-07-19 — End: 2021-07-19
  Administered 2021-07-19: 100 mg via INTRAVENOUS

## 2021-07-19 MED ORDER — DEXAMETHASONE SODIUM PHOSPHATE 10 MG/ML IJ SOLN
INTRAMUSCULAR | Status: DC | PRN
  Administered 2021-07-19: 4 mg via INTRAVENOUS

## 2021-07-19 MED ORDER — DILTIAZEM HCL ER COATED BEADS 180 MG PO CP24: 180.0000 mg | ORAL_CAPSULE | Freq: Every day | ORAL | Status: DC

## 2021-07-19 MED ORDER — PROPOFOL 10 MG/ML IV BOLUS
INTRAVENOUS | Status: DC | PRN
  Administered 2021-07-19: 200 mg via INTRAVENOUS

## 2021-07-19 MED ORDER — HEPARIN (PORCINE) IN NACL 1000-0.9 UT/500ML-% IV SOLN
INTRAVENOUS | Status: AC
  Filled 2021-07-19: qty 2500

## 2021-07-19 MED ORDER — SODIUM CHLORIDE 0.9% FLUSH
3.0000 mL | Freq: Two times a day (BID) | INTRAVENOUS | Status: DC
  Administered 2021-07-19 – 2021-07-20 (×2): 3 mL via INTRAVENOUS

## 2021-07-19 MED ORDER — SODIUM CHLORIDE 0.9 % IV SOLN: INTRAVENOUS | Status: DC

## 2021-07-19 MED ORDER — ONDANSETRON HCL 4 MG/2ML IJ SOLN: 4.0000 mg | Freq: Four times a day (QID) | INTRAMUSCULAR | Status: DC | PRN

## 2021-07-19 MED ORDER — DOBUTAMINE INFUSION FOR EP/ECHO/NUC (1000 MCG/ML)
INTRAVENOUS | Status: AC
  Filled 2021-07-19: qty 250

## 2021-07-19 MED ORDER — ALBUTEROL SULFATE (2.5 MG/3ML) 0.083% IN NEBU: 2.5000 mg | INHALATION_SOLUTION | Freq: Four times a day (QID) | RESPIRATORY_TRACT | Status: DC | PRN

## 2021-07-19 MED ORDER — FENTANYL CITRATE (PF) 250 MCG/5ML IJ SOLN
INTRAMUSCULAR | Status: DC | PRN
  Administered 2021-07-19 (×2): 50 ug via INTRAVENOUS

## 2021-07-19 MED ORDER — DOBUTAMINE INFUSION FOR EP/ECHO/NUC (1000 MCG/ML)
INTRAVENOUS | Status: DC | PRN
  Administered 2021-07-19: 20 ug/kg/min via INTRAVENOUS

## 2021-07-19 MED ORDER — HEPARIN SODIUM (PORCINE) 1000 UNIT/ML IJ SOLN
INTRAMUSCULAR | Status: DC | PRN
  Administered 2021-07-19: 1000 [IU] via INTRAVENOUS

## 2021-07-19 MED ORDER — VASOPRESSIN 20 UNIT/ML IV SOLN
INTRAVENOUS | Status: AC
  Filled 2021-07-19: qty 1

## 2021-07-19 MED ORDER — ROCURONIUM BROMIDE 10 MG/ML (PF) SYRINGE
PREFILLED_SYRINGE | INTRAVENOUS | Status: DC | PRN
  Administered 2021-07-19: 60 mg via INTRAVENOUS

## 2021-07-19 MED ORDER — ACETAMINOPHEN 325 MG PO TABS
650.0000 mg | ORAL_TABLET | ORAL | Status: DC | PRN
Start: 2021-07-19 — End: 2021-07-20

## 2021-07-19 MED ORDER — SODIUM CHLORIDE 0.9 % IV SOLN: 250.0000 mL | INTRAVENOUS | Status: DC | PRN

## 2021-07-19 MED ORDER — VASOPRESSIN 20 UNIT/ML IV SOLN
INTRAVENOUS | Status: DC | PRN
  Administered 2021-07-19: 2 [IU] via INTRAVENOUS

## 2021-07-19 MED ORDER — SUGAMMADEX SODIUM 200 MG/2ML IV SOLN
INTRAVENOUS | Status: DC | PRN
  Administered 2021-07-19: 300 mg via INTRAVENOUS

## 2021-07-19 MED ORDER — MONTELUKAST SODIUM 10 MG PO TABS
10.0000 mg | ORAL_TABLET | Freq: Every day | ORAL | Status: DC
  Administered 2021-07-19: 10 mg via ORAL
  Filled 2021-07-19: qty 1

## 2021-07-19 MED ORDER — HEPARIN SODIUM (PORCINE) 1000 UNIT/ML IJ SOLN
INTRAMUSCULAR | Status: DC | PRN
  Administered 2021-07-19: 6000 [IU] via INTRAVENOUS
  Administered 2021-07-19: 15000 [IU] via INTRAVENOUS
  Administered 2021-07-19: 4000 [IU] via INTRAVENOUS

## 2021-07-19 MED ORDER — ONDANSETRON HCL 4 MG/2ML IJ SOLN
INTRAMUSCULAR | Status: DC | PRN
  Administered 2021-07-19: 4 mg via INTRAVENOUS

## 2021-07-19 MED ORDER — INSULIN ASPART 100 UNIT/ML IJ SOLN
0.0000 [IU] | Freq: Every day | INTRAMUSCULAR | Status: DC
Start: 2021-07-19 — End: 2021-07-20
  Administered 2021-07-19: 3 [IU] via SUBCUTANEOUS

## 2021-07-19 MED ORDER — INSULIN ASPART 100 UNIT/ML IJ SOLN
0.0000 [IU] | Freq: Three times a day (TID) | INTRAMUSCULAR | Status: DC
  Administered 2021-07-19: 5 [IU] via SUBCUTANEOUS
  Administered 2021-07-20: 3 [IU] via SUBCUTANEOUS

## 2021-07-19 MED ORDER — PHENYLEPHRINE HCL-NACL 20-0.9 MG/250ML-% IV SOLN
INTRAVENOUS | Status: DC | PRN
Start: 2021-07-19 — End: 2021-07-19
  Administered 2021-07-19: 25 ug/min via INTRAVENOUS

## 2021-07-19 SURGICAL SUPPLY — 25 items
BAG SNAP BAND KOVER 36X36 (MISCELLANEOUS) ×1 IMPLANT
BLANKET WARM UNDERBOD FULL ACC (MISCELLANEOUS) ×2 IMPLANT
CATH 8FR REPROCESSED SOUNDSTAR (CATHETERS) ×2 IMPLANT
CATH 8FR SOUNDSTAR REPROCESSED (CATHETERS) IMPLANT
CATH OCTARAY 2.0 F 3-3-3-3-3 (CATHETERS) ×1 IMPLANT
CATH S CIRCA THERM PROBE 10F (CATHETERS) ×1 IMPLANT
CATH SMTCH THERMOCOOL SF DF (CATHETERS) ×1 IMPLANT
CATH WEB BI DIR CSDF CRV REPRO (CATHETERS) ×1 IMPLANT
CLOSURE PERCLOSE PROSTYLE (VASCULAR PRODUCTS) ×4 IMPLANT
COVER SWIFTLINK CONNECTOR (BAG) ×2 IMPLANT
MAT PREVALON FULL STRYKER (MISCELLANEOUS) ×1 IMPLANT
NDL BAYLIS TRANSSEPTAL 71CM (NEEDLE) IMPLANT
NEEDLE BAYLIS TRANSSEPTAL 71CM (NEEDLE) ×2 IMPLANT
PACK EP LATEX FREE (CUSTOM PROCEDURE TRAY) ×1
PACK EP LF (CUSTOM PROCEDURE TRAY) ×1 IMPLANT
PAD PRO RADIOLUCENT 2001M-C (PAD) ×2 IMPLANT
PATCH CARTO3 (PAD) ×1 IMPLANT
SHEATH BAYLIS TRANSSEPTAL 98CM (NEEDLE) ×1 IMPLANT
SHEATH CARTO VIZIGO SM CVD (SHEATH) ×1 IMPLANT
SHEATH PINNACLE 7F 10CM (SHEATH) ×1 IMPLANT
SHEATH PINNACLE 8F 10CM (SHEATH) ×2 IMPLANT
SHEATH PINNACLE 9F 10CM (SHEATH) ×1 IMPLANT
SHEATH PROBE COVER 6X72 (BAG) ×1 IMPLANT
SHEATH SWARTZ TS SL2 63CM 8.5F (SHEATH) ×1 IMPLANT
TUBING SMART ABLATE COOLFLOW (TUBING) ×1 IMPLANT

## 2021-07-19 NOTE — Anesthesia Postprocedure Evaluation (Signed)
Anesthesia Post Note  Patient: Sean [name].  Procedure(s) Performed: ATRIAL FIBRILLATION ABLATION     Patient location during evaluation: PACU Anesthesia Type: General Level of consciousness: awake and alert, oriented and patient cooperative Pain management: pain level controlled Vital Signs Assessment: post-procedure vital signs reviewed and stable Respiratory status: spontaneous breathing, nonlabored ventilation and respiratory function stable Cardiovascular status: blood pressure returned to baseline and stable Postop Assessment: no apparent nausea or vomiting Anesthetic complications: no   There were no known notable events for this encounter.  Last Vitals:  Vitals:   07/19/21 1530 07/19/21 1600  BP: (!) 101/52 (!) 108/59  Pulse: 76 76  Resp: 18 17  Temp:    SpO2: 95% 95%    Last Pain:  Vitals:   07/19/21 1430  TempSrc:   PainSc: 0-No pain                 Pervis Hocking

## 2021-07-19 NOTE — Anesthesia Procedure Notes (Signed)
Procedure Name: Intubation Date/Time: 07/19/2021 11:06 AM Performed by: Amadeo Garnet, CRNA Pre-anesthesia Checklist: Patient identified, Emergency Drugs available, Suction available and Patient being monitored Patient Re-evaluated:Patient Re-evaluated prior to induction Oxygen Delivery Method: Circle system utilized Preoxygenation: Pre-oxygenation with 100% oxygen Induction Type: IV induction Ventilation: Mask ventilation without difficulty and Oral airway inserted - appropriate to patient size Laryngoscope Size: Mac and 4 Grade View: Grade I Tube type: Oral Tube size: 7.5 mm Number of attempts: 1 Airway Equipment and Method: Stylet and Oral airway Placement Confirmation: ETT inserted through vocal cords under direct vision, positive ETCO2 and breath sounds checked- equal and bilateral Secured at: 22 cm Tube secured with: Tape Dental Injury: Teeth and Oropharynx as per pre-operative assessment

## 2021-07-19 NOTE — Anesthesia Preprocedure Evaluation (Signed)
Anesthesia Evaluation  Patient identified by MRN, date of birth, ID band Patient awake    Reviewed: Allergy & Precautions, NPO status , Patient's Chart, lab work & pertinent test results  Airway Mallampati: II  TM Distance: >3 FB Neck ROM: Full    Dental  (+) Teeth Intact   Pulmonary sleep apnea and Continuous Positive Airway Pressure Ventilation , COPD,  COPD inhaler,    Pulmonary exam normal        Cardiovascular hypertension, Pt. on medications and Pt. on home beta blockers +CHF  + dysrhythmias Atrial Fibrillation  Rhythm:Irregular Rate:Normal     Neuro/Psych negative neurological ROS  negative psych ROS   GI/Hepatic Neg liver ROS, GERD  Medicated,  Endo/Other  diabetes, Type 2, Insulin DependentMorbid obesity  Renal/GU negative Renal ROS   BPH    Musculoskeletal negative musculoskeletal ROS (+)   Abdominal (+) + obese,  Abdomen: soft.    Peds  Hematology negative hematology ROS (+)   Anesthesia Other Findings   Reproductive/Obstetrics                             Anesthesia Physical Anesthesia Plan  ASA: 3  Anesthesia Plan: General   Post-op Pain Management:    Induction:   PONV Risk Score and Plan: 2 and Ondansetron, Dexamethasone and Treatment may vary due to age or medical condition  Airway Management Planned: Mask and Oral ETT  Additional Equipment: None  Intra-op Plan:   Post-operative Plan: Extubation in OR  Informed Consent: I have reviewed the patients History and Physical, chart, labs and discussed the procedure including the risks, benefits and alternatives for the proposed anesthesia with the patient or authorized representative who has indicated his/her understanding and acceptance.     Dental advisory given  Plan Discussed with: CRNA  Anesthesia Plan Comments: (Lab Results      Component                Value               Date                       WBC                      5.7                 07/10/2021                HGB                      14.8                07/10/2021                HCT                      45.0                07/10/2021                MCV                      91                  07/10/2021  PLT                      187                 07/10/2021           Lab Results      Component                Value               Date                      NA                       141                 07/10/2021                K                        4.2                 07/10/2021                CO2                      26                  07/10/2021                GLUCOSE                  110 (H)             07/10/2021                BUN                      15                  07/10/2021                CREATININE               1.18                07/10/2021                CALCIUM                  8.9                 07/10/2021                GFRNONAA                 >60                 04/12/2021                GFRAA                    >60                 12/26/2019           ECHO 05/21: 1. Left ventricular ejection fraction, by estimation, is 60 to 65%. The  left ventricle has normal function. The  left ventricle has no regional  wall motion abnormalities. Left ventricular diastolic parameters are  consistent with Grade I diastolic  dysfunction (impaired relaxation). )        Anesthesia Quick Evaluation

## 2021-07-19 NOTE — Progress Notes (Signed)
Pt ambulated at 1630 and bilateral groin sites were clean dry and intact. At 1640, while waiting for wheelchair, pt called out stating his left groin was bleeding. Pt returned to the bed and placed in a supine position. Manual pressure held to the left groin for 25 minutes. Dr. Curt Bears notified and came to bedside. Orders placed for pt to be admitted overnight. Left groin site redressed and clean dry and intact at time of transfer to 6E30. New IV placed in RAC prior to transfer. Pt's vital signs remained stable. Report called to receiving RN prior to transfer.

## 2021-07-19 NOTE — Discharge Instructions (Addendum)
Post procedure care instructions No driving for 4 days. No lifting over 5 lbs for 1 week. No vigorous or sexual activity for 1 week. You may return to work/your usual activities on 07/26/21. Keep procedure site clean & dry. If you notice increased pain, swelling, bleeding or pus, call/return!  You may shower after 24 hours, but no soaking in baths/hot tubs/pools for 1 week.     Cardiac Ablation, Care After  This sheet gives you information about how to care for yourself after your procedure. Your health care provider may also give you more specific instructions. If you have problems or questions, contact your health care provider. What can I expect after the procedure? After the procedure, it is common to have: Bruising around your puncture site. Tenderness around your puncture site. Skipped heartbeats. Tiredness (fatigue).  Follow these instructions at home: Puncture site care  Follow instructions from your health care provider about how to take care of your puncture site. Make sure you: If present, leave stitches (sutures), skin glue, or adhesive strips in place. These skin closures may need to stay in place for up to 2 weeks. If adhesive strip edges start to loosen and curl up, you may trim the loose edges. Do not remove adhesive strips completely unless your health care provider tells you to do that. If a large square bandage is present, this may be removed 24 hours after surgery.  Check your puncture site every day for signs of infection. Check for: Redness, swelling, or pain. Fluid or blood. If your puncture site starts to bleed, lie down on your back, apply firm pressure to the area, and contact your health care provider. Warmth. Pus or a bad smell. Driving Do not drive for at least 4 days after your procedure or however long your health care provider recommends. (Do not resume driving if you have previously been instructed not to drive for other health reasons.) Do not drive or use  heavy machinery while taking prescription pain medicine. Activity Avoid activities that take a lot of effort for at least 7 days after your procedure. Do not lift anything that is heavier than 5 lb (4.5 kg) for one week.  No sexual activity for 1 week.  Return to your normal activities as told by your health care provider. Ask your health care provider what activities are safe for you. General instructions Take over-the-counter and prescription medicines only as told by your health care provider. Do not use any products that contain nicotine or tobacco, such as cigarettes and e-cigarettes. If you need help quitting, ask your health care provider. You may shower after 24 hours, but Do not take baths, swim, or use a hot tub for 1 week.  Do not drink alcohol for 24 hours after your procedure. Keep all follow-up visits as told by your health care provider. This is important. Contact a health care provider if: You have redness, mild swelling, or pain around your puncture site. You have fluid or blood coming from your puncture site that stops after applying firm pressure to the area. Your puncture site feels warm to the touch. You have pus or a bad smell coming from your puncture site. You have a fever. You have chest pain or discomfort that spreads to your neck, jaw, or arm. You are sweating a lot. You feel nauseous. You have a fast or irregular heartbeat. You have shortness of breath. You are dizzy or light-headed and feel the need to lie down. You have pain or  numbness in the arm or leg closest to your puncture site. Get help right away if: Your puncture site suddenly swells. Your puncture site is bleeding and the bleeding does not stop after applying firm pressure to the area. These symptoms may represent a serious problem that is an emergency. Do not wait to see if the symptoms will go away. Get medical help right away. Call your local emergency services (911 in the [place] Do not drive  yourself to the hospital. Summary After the procedure, it is normal to have bruising and tenderness at the puncture site in your groin, neck, or forearm. Check your puncture site every day for signs of infection. Get help right away if your puncture site is bleeding and the bleeding does not stop after applying firm pressure to the area. This is a medical emergency. This information is not intended to replace advice given to you by your health care provider. Make sure you discuss any questions you have with your health care provider.   You have an appointment set up with the [facility].  Multiple studies have shown that being followed by a dedicated atrial fibrillation clinic in addition to the standard care you receive from your other physicians improves health. We believe that enrollment in the atrial fibrillation clinic will allow [place] to better care for you.   The phone number to the [facility] is [phone]. The clinic is staffed Monday through Friday from 8:30am to 5pm.  Parking Directions: The clinic is located in the Heart and Vascular Building connected to [facility]. 1)From [address] turn on to [address] and go to the 3rd entrance  (Heart and Vascular entrance) on the right. 2)Look to the right for Heart &Vascular Parking Garage. 3)A code for the entrance is required, for Oct is 3333.   4)Take the elevators to the 1st floor. Registration is in the room with the glass walls at the end of the hallway.  If you have any trouble parking or locating the clinic, please don't hesitate to call [phone].

## 2021-07-19 NOTE — H&P (Signed)
Electrophysiology Office Note   Date:  07/19/2021   ID:  Sean [name]., DOB [birth year], MRN [id number]  PCP:  [name], MD  Cardiologist:  [name] Primary Electrophysiologist:  Bob Eastwood Meredith Leeds, MD    Chief Complaint: AF   History of Present Illness: Sean [name]. is a 70 y.o. male who is being seen today for the evaluation of AF at the request of No ref. provider found. Presenting today for electrophysiology evaluation.  He has a history significant for hypertension, hyperlipidemia, CHF, and atrial fibrillation.  He had an attempted cardioversion on 11/05/2019 but unfortunately did not go back into normal rhythm.  He was having symptoms of fatigue echo showed an ejection fraction of 35 to 40%.  He is status post atrial fibrillation ablation 12/25/2019.  Repeat echo showed normalization of his ejection fraction.  He was seen in the emergency room 04/12/2021 with elevated heart rates and was noted to be back in atrial fibrillation.  He was diagnosed with COVID around that time.  He had a cardioversion 04/17/2021.  Unfortunately he was back in atrial fibrillation as part of a preoperative risk evaluation for a lumbar steroid injection.  Today, denies symptoms of palpitations, chest pain, shortness of breath, orthopnea, PND, lower extremity edema, claudication, dizziness, presyncope, syncope, bleeding, or neurologic sequela. The patient is tolerating medications without difficulties. Plan ablation today.    Past Medical History:  Diagnosis Date   Allergic rhinitis    Arrhythmia    Atrial fibrillation ([place])    Benign prostatic hyperplasia with urinary obstruction 11/29/2018   CHF (congestive heart failure) ([place])    Diabetes mellitus due to underlying condition with unspecified complications ([place]) 15/04/2619   Diabetes mellitus without complication ([facility])    DOE (dyspnea on exertion) 10/29/2019   S/p PE  11/2018 > DOAC recurrent 09/14/2019 off DOAC so resumed  - onset variable doe  and orthostatic lightheadedness 06/2019 with afib - PFT's [place]  10/08/2019 :  FEV1 2.79 (73%) with ratio 75 and 6 % better p saba,  Air trapping on lung vol,  ERV 14% and dlco 22.78 (60%) corrects to 3.68 (76%) for vol with minimal concavity to f/v loop  - 10/29/2019   Walked RA x two laps =  approx 542ft @ avg   Essential hypertension 09/29/2019   Fissure in skin of foot 12/11/2016   GERD (gastroesophageal reflux disease) 11/29/2018   Hemospermia 02/22/2020   History of pulmonary embolism 09/29/2019   Hyperlipidemia    Hypertension    Hypogonadism in male 02/22/2020   IBS (irritable bowel syndrome) 11/29/2018   Impotence 02/22/2020   Incomplete emptying of bladder 02/22/2020   Mixed dyslipidemia 09/29/2019   Morbid obesity ([place]) 09/29/2019   OSA on CPAP    Overgrown toenails 12/11/2016   Persistent atrial fibrillation ([place]) 09/29/2019   Plantar fasciitis, bilateral 06/05/2016   Secondary hypercoagulable state ([place]) 01/22/2020   Upper airway cough syndrome 12/15/2019   Noted on exam 12/15/2019 onset while on Entresto, resolved with purse lip   Past Surgical History:  Procedure Laterality Date   ATRIAL FIBRILLATION ABLATION N/A 12/25/2019   Procedure: ATRIAL FIBRILLATION ABLATION;  Surgeon: [name], MD;  Location: [facility];  Service: Cardiovascular;  Laterality: N/A;   BIOPSY  08/11/2020   Procedure: BIOPSY;  Surgeon: [name], MD;  Location: [facility];  Service: Endoscopy;;   CARDIAC CATHETERIZATION     CARDIOVERSION N/A 04/17/2021   Procedure: CARDIOVERSION;  Surgeon: [name], MD;  Location:  [facility];  Service: Cardiovascular;  Laterality: N/A;   ESOPHAGOGASTRODUODENOSCOPY (EGD) WITH PROPOFOL N/A 08/11/2020   Procedure: ESOPHAGOGASTRODUODENOSCOPY (EGD) WITH PROPOFOL;  Surgeon: [name], MD;  Location: [facility];  Service: Endoscopy;  Laterality: N/A;   KNEE SURGERY     PILONIDAL CYST / SINUS EXCISION     UPPER ESOPHAGEAL ENDOSCOPIC ULTRASOUND  (EUS) N/A 08/11/2020   Procedure: UPPER ESOPHAGEAL ENDOSCOPIC ULTRASOUND (EUS);  Surgeon: [name], MD;  Location: [facility];  Service: Endoscopy;  Laterality: N/A;     Current Facility-Administered Medications  Medication Dose Route Frequency Provider Last Rate Last Admin   0.9 %  sodium chloride infusion   Intravenous Continuous Kaston Faughn, Ocie Doyne, MD        Allergies:   Prednisone and Victoza [liraglutide]   Social History:  The patient  reports that he has never smoked. He has never used smokeless tobacco. He reports that he does not currently use alcohol. He reports that he does not use drugs.   Family History:  The patient's family history includes Colon cancer in his father; Hyperlipidemia in his father; Hypertension in his father; Liver cancer in his father; Lung cancer in his father.   ROS:  Please see the history of present illness.   Otherwise, review of systems is positive for none.   All other systems are reviewed and negative.   PHYSICAL EXAM: VS:  BP 124/74   Pulse 60   Temp 98.3 F (36.8 C) (Oral)   Ht 6\' 2"  (1.88 m)   Wt (!) 140.6 kg   SpO2 98%   BMI 39.80 kg/m  , BMI Body mass index is 39.8 kg/m. GEN: Well nourished, well developed, in no acute distress  HEENT: normal  Neck: no JVD, carotid bruits, or masses Cardiac: irregular; no murmurs, rubs, or gallops,no edema  Respiratory:  clear to auscultation bilaterally, normal work of breathing GI: soft, nontender, nondistended, + BS MS: no deformity or atrophy  Skin: warm and dry Neuro:  Strength and sensation are intact Psych: euthymic mood, full affect   Recent Labs: 04/12/2021: B Natriuretic Peptide 219.3 04/13/2021: Magnesium 2.2; TSH 1.090 07/10/2021: BUN 15; Creatinine, Ser 1.18; Hemoglobin 14.8; Platelets 187; Potassium 4.2; Sodium 141    Lipid Panel     Component Value Date/Time   CHOL 135 10/09/2019 0820   TRIG 159 (H) 10/09/2019 0820   HDL 47 10/09/2019 0820   CHOLHDL 2.9  10/09/2019 0820   LDLCALC 61 10/09/2019 0820     Wt Readings from Last 3 Encounters:  07/19/21 (!) 140.6 kg  07/18/21 (!) 141.2 kg  06/24/21 136.1 kg      Other studies Reviewed: Additional studies/ records that were reviewed today include: TTE 03/11/2020 Review of the above records today demonstrates:   1. Left ventricular ejection fraction, by estimation, is 60 to 65%. The  left ventricle has normal function. The left ventricle has no regional  wall motion abnormalities. Left ventricular diastolic parameters are  consistent with Grade I diastolic  dysfunction (impaired relaxation).   ASSESSMENT AND PLAN:  1.  Persistent atrial fibrillation/atrial flutter: Azzan Butler. has presented today for surgery, with the diagnosis of AF.  The various methods of treatment have been discussed with the patient and family. After consideration of risks, benefits and other options for treatment, the patient has consented to  Procedure(s): Catheter ablation as a surgical intervention .  Risks include but not limited to complete heart block, stroke, esophageal damage, nerve damage, bleeding, vascular damage,  tamponade, perforation, MI, and death. The patient's history has been reviewed, patient examined, no change in status, stable for surgery.  I have reviewed the patient's chart and labs.  Questions were answered to the patient's satisfaction.    Cristabel Bicknell Curt Bears, MD 07/19/2021 8:48 AM

## 2021-07-19 NOTE — Transfer of Care (Signed)
Immediate Anesthesia Transfer of Care Note  Patient: Sean [name].  Procedure(s) Performed: ATRIAL FIBRILLATION ABLATION  Patient Location: Cath Lab  Anesthesia Type:General  Level of Consciousness: awake, alert  and oriented  Airway & Oxygen Therapy: Patient Spontanous Breathing and Patient connected to face mask oxygen  Post-op Assessment: Report given to RN, Post -op Vital signs reviewed and stable and Patient moving all extremities  Post vital signs: Reviewed and stable  Last Vitals:  Vitals Value Taken Time  BP 107/66 07/19/21 1323  Temp    Pulse 63 07/19/21 1327  Resp 15 07/19/21 1327  SpO2 100 % 07/19/21 1327  Vitals shown include unvalidated device data.  Last Pain:  Vitals:   07/19/21 0900  TempSrc:   PainSc: 0-No pain      Patients Stated Pain Goal: 3 (99/69/24 9324)  Complications: There were no known notable events for this encounter.

## 2021-07-19 NOTE — Plan of Care (Signed)
  Problem: Education: Goal: Knowledge of disease or condition will improve Outcome: Progressing   Problem: Activity: Goal: Ability to tolerate increased activity will improve Outcome: Progressing   Problem: Cardiac: Goal: Ability to achieve and maintain adequate cardiopulmonary perfusion will improve Outcome: Progressing   Problem: Health Behavior/Discharge Planning: Goal: Ability to safely manage health-related needs after discharge will improve Outcome: Progressing   

## 2021-07-20 ENCOUNTER — Encounter (HOSPITAL_COMMUNITY): Payer: Self-pay | Admitting: Cardiology

## 2021-07-20 DIAGNOSIS — I5022 Chronic systolic (congestive) heart failure: Secondary | ICD-10-CM | POA: Diagnosis not present

## 2021-07-20 DIAGNOSIS — G4733 Obstructive sleep apnea (adult) (pediatric): Secondary | ICD-10-CM | POA: Diagnosis not present

## 2021-07-20 DIAGNOSIS — I110 Hypertensive heart disease with heart failure: Secondary | ICD-10-CM | POA: Diagnosis not present

## 2021-07-20 DIAGNOSIS — I4819 Other persistent atrial fibrillation: Secondary | ICD-10-CM | POA: Diagnosis not present

## 2021-07-20 DIAGNOSIS — Z794 Long term (current) use of insulin: Secondary | ICD-10-CM | POA: Diagnosis not present

## 2021-07-20 DIAGNOSIS — I4892 Unspecified atrial flutter: Secondary | ICD-10-CM | POA: Diagnosis not present

## 2021-07-20 DIAGNOSIS — E782 Mixed hyperlipidemia: Secondary | ICD-10-CM | POA: Diagnosis not present

## 2021-07-20 DIAGNOSIS — Z7901 Long term (current) use of anticoagulants: Secondary | ICD-10-CM | POA: Diagnosis not present

## 2021-07-20 DIAGNOSIS — I428 Other cardiomyopathies: Secondary | ICD-10-CM | POA: Diagnosis not present

## 2021-07-20 DIAGNOSIS — E119 Type 2 diabetes mellitus without complications: Secondary | ICD-10-CM | POA: Diagnosis not present

## 2021-07-20 DIAGNOSIS — Z79899 Other long term (current) drug therapy: Secondary | ICD-10-CM | POA: Diagnosis not present

## 2021-07-20 LAB — GLUCOSE, CAPILLARY: Glucose-Capillary: 166 mg/dL — ABNORMAL HIGH (ref 70–99)

## 2021-07-20 LAB — HEMOGLOBIN A1C
Hgb A1c MFr Bld: 7.9 % — ABNORMAL HIGH (ref 4.8–5.6)
Mean Plasma Glucose: 180 mg/dL

## 2021-07-20 NOTE — Plan of Care (Signed)

## 2021-07-20 NOTE — Progress Notes (Signed)
Pt ambulated well to 600 feet in the hallway.with no noted adverse episode on both groin. HR went as high as 116 but had gone down  below 100 in less than a minute. No c/o of SOB while ambulating.

## 2021-07-20 NOTE — Discharge Summary (Addendum)
ELECTROPHYSIOLOGY PROCEDURE DISCHARGE SUMMARY    Patient ID: Sean [name].,  MRN: [id number], DOB/AGE: 72-Feb-1952 72 y.o.  Admit date: 07/19/2021 Discharge date: 07/20/2021  Primary Care Physician: [name], MD  Primary Cardiologist: Dr. Geraldo Pitter Electrophysiologist: Dr. Curt Bears  Primary Discharge Diagnosis:  persistent Atrial fibrillation      CHA2DS2Vasc is 4, on Eliquis  Secondary Discharge Diagnosis:  HTN HLD NICM Recovered LVEF Chronic CHF (systolic) Currently compensated 5. Obesity 6, DM 7. OSA  Procedures This Admission:  1.  Electrophysiology study and radiofrequency catheter ablation on by Dr Curt Bears   This study demonstrated  CONCLUSIONS: 1. Atrial flutter upon presentation.   2. Successful electrical isolation of the left inferior pulmonary vein 3. Additional left atrial ablation was performed with a standard box lesion created along the posterior wall of the left atrium 4. Atrial flutter successfully converted to sinus rhythm during ablation. 5. No early apparent complications.     Brief HPI: Amahd [name]. is a 70 y.o. male with a history of persistent atrial fibrillation (w/prior AF ablation), followed by Dr. Curt Bears. . Risks, benefits, and alternatives to catheter ablation of atrial fibrillation were reviewed with the patient who wished to proceed.  The patient underwent cardiac CT prior to the procedure which demonstrated no LAA thrombus.    Hospital Course:  The patient was admitted and underwent EPS/RFCA of atrial fibrillation with details as outlined above.  He was planned for same day discharge though once up to ambulated R groin bleed requiring re-hold of pressure and was admitted for observation.  They were monitored on telemetry overnight which demonstrated SR, 1st degree AVBlock.  Groins are both soft, nontender, no hematoma and without complication on the day of discharge.  The patient feels well today, denies CP, SOB, site discomfort.   He was examined by Dr. Curt Bears and considered to be stable for discharge.  Wound care and restrictions were reviewed with the patient.  The patient [name] be seen back by the Afib clinic in 4 weeks and Dr Curt Bears in 12 weeks for post ablation follow up.   Annel Zunker stop his home diltiazem given significant 1st degree AVblock, 335ms   Physical Exam: Vitals:   07/19/21 1730 07/19/21 1814 07/19/21 2014 07/20/21 0401  BP: (!) 109/54 102/63 120/67 110/62  Pulse: 78 78 87 77  Resp: 13 16 18 18   Temp:  (!) 97.5 F (36.4 C) 98.2 F (36.8 C) 97.6 F (36.4 C)  TempSrc:  Oral Oral Oral  SpO2: 94% 96% 94% 96%  Weight:  (!) 144.1 kg    Height:  6\' 2"  (1.88 m)      Seen and examined by Dr. Sedalia Muta- The patient is well appearing, alert and oriented x 3 today.   HEENT: normocephalic, atraumatic; sclera clear, conjunctiva pink; hearing intact; oropharynx clear; neck supple  Lungs- CTA b/l, normal work of breathing.  No wheezes, rales, rhonchi Heart- RRR, no murmurs, rubs or gallops  GI- soft, non-tender, non-distended, bowel sounds present  Extremities- no clubbing, cyanosis, or edema; b/l  groins are without hematoma/bruit MS- no significant deformity or atrophy Skin- warm and dry, no rash or lesion Psych- euthymic mood, full affect Neuro- strength and sensation are intact   Labs:   Lab Results  Component Value Date   WBC 5.7 07/10/2021   HGB 14.8 07/10/2021   HCT 45.0 07/10/2021   MCV 91 07/10/2021   PLT 187 07/10/2021   No results for input(s): NA, K, CL, CO2, BUN, CREATININE,  CALCIUM, PROT, BILITOT, ALKPHOS, ALT, AST, GLUCOSE in the last 168 hours.  Invalid input(s): LABALBU   Discharge Medications:  Allergies as of 07/20/2021       Reactions   Prednisone Other (See Comments)   nervous   Victoza [liraglutide] Diarrhea        Medication List     STOP taking these medications    diltiazem 180 MG 24 hr capsule Commonly known as: CARDIZEM CD       TAKE these  medications    albuterol 108 (90 Base) MCG/ACT inhaler Commonly known as: VENTOLIN HFA Inhale 2 puffs into the lungs every 6 (six) hours as needed for wheezing or shortness of breath.   albuterol (2.5 MG/3ML) 0.083% nebulizer solution Commonly known as: PROVENTIL Take 2.5 mg by nebulization every 6 (six) hours as needed for wheezing or shortness of breath.   allopurinol 100 MG tablet Commonly known as: ZYLOPRIM Take 100 mg by mouth in the morning.   atorvastatin 20 MG tablet Commonly known as: LIPITOR Take 1 tablet (20 mg total) by mouth in the morning.   COSAMIN DS PO Take 1 tablet by mouth in the morning and at bedtime.   Eliquis 5 MG Tabs tablet Generic drug: apixaban Take 1 tablet (5 mg total) by mouth 2 (two) times daily.   Entresto 24-26 MG Generic drug: sacubitril-valsartan Take 1 tablet by mouth 2 (two) times daily.   Eszopiclone 3 MG Tabs Take 3 mg by mouth at bedtime.   Fish Oil 1200 MG Caps Take 1,200 mg by mouth 2 (two) times daily.   fluticasone 50 MCG/ACT nasal spray Commonly known as: FLONASE Place 2 sprays into both nostrils daily as needed for allergies or rhinitis.   furosemide 40 MG tablet Commonly known as: LASIX Take 40 mg by mouth daily as needed for fluid.   gabapentin 300 MG capsule Commonly known as: NEURONTIN Take 300 mg by mouth at bedtime.   insulin lispro 100 UNIT/ML injection Commonly known as: HUMALOG Inject 0-10 Units into the skin 3 (three) times daily as needed for high blood sugar. Sliding Scale   Jardiance 25 MG Tabs tablet Generic drug: empagliflozin Take 25 mg by mouth in the morning.   metoprolol succinate 100 MG 24 hr tablet Commonly known as: TOPROL-XL Take 100 mg by mouth at bedtime.   metoprolol succinate 50 MG 24 hr tablet Commonly known as: TOPROL-XL Take 1 tablet (50 mg total) by mouth daily. Take with or immediately following a meal.   montelukast 10 MG tablet Commonly known as: SINGULAIR Take 10 mg by  mouth at bedtime.   omeprazole 40 MG capsule Commonly known as: PRILOSEC Take 40 mg by mouth 2 (two) times daily.   rOPINIRole 2 MG tablet Commonly known as: REQUIP Take 2 mg by mouth at bedtime.   tamsulosin 0.4 MG Caps capsule Commonly known as: FLOMAX Take 0.4 capsules by mouth 2 (two) times daily.   Toujeo Max SoloStar 300 UNIT/ML Solostar Pen Generic drug: insulin glargine (2 Unit Dial) Inject 94 Units into the skin at bedtime.   traMADol 50 MG tablet Commonly known as: ULTRAM Take 50 mg by mouth daily as needed for moderate pain.   TURMERIC PO Take 1,000 mg by mouth in the morning and at bedtime.   venlafaxine XR 150 MG 24 hr capsule Commonly known as: EFFEXOR-XR Take 150 mg by mouth in the morning.   vitamin C 1000 MG tablet Take 1,000 mg by mouth 2 (two) times daily.  Vitamin D (Ergocalciferol) 1.25 MG (50000 UNIT) Caps capsule Commonly known as: DRISDOL Take 50,000 Units by mouth every Tuesday. IN THE MORNING   Vitamin D3 50 MCG (2000 UT) Tabs Take 2,000 Units by mouth 2 (two) times daily.        Disposition: Home Discharge Instructions     Diet - low sodium heart healthy   Complete by: As directed    Increase activity slowly   Complete by: As directed        Follow-up Information     [facility] Follow up.   Specialty: Cardiology Why: 08/16/21 @ 10:00AM with R. Marlene Lard, PA-C Contact information: [address] [phone] mc [address] [place] [address] [phone]        [name], MD Follow up.   Specialty: Cardiology Why: 11/06/21 @ 10:00AM Contact information: [place] [address] [phone]                 Duration of Discharge Encounter: Greater than 30 minutes including physician time.  Venetia Night, PA-C 07/20/2021 9:31 AM   I have seen and examined this patient with Tommye Standard.  Agree with above, note added to reflect my findings.  On  exam, RRR, no murmurs. Patient post AF ablation with left leg bleeding yesterday. Groin site stable today. Remains in sinus rhythm. [place] for discharge with follow up in clinic.    Haydn Cush M. Britteny Fiebelkorn MD 07/20/2021 9:37 AM

## 2021-08-01 ENCOUNTER — Ambulatory Visit (INDEPENDENT_AMBULATORY_CARE_PROVIDER_SITE_OTHER): Payer: Medicare Other | Admitting: Sports Medicine

## 2021-08-01 ENCOUNTER — Encounter: Payer: Self-pay | Admitting: Sports Medicine

## 2021-08-01 ENCOUNTER — Ambulatory Visit (INDEPENDENT_AMBULATORY_CARE_PROVIDER_SITE_OTHER): Payer: Medicare Other

## 2021-08-01 ENCOUNTER — Other Ambulatory Visit: Payer: Self-pay

## 2021-08-01 DIAGNOSIS — S92401D Displaced unspecified fracture of right great toe, subsequent encounter for fracture with routine healing: Secondary | ICD-10-CM

## 2021-08-01 DIAGNOSIS — E088 Diabetes mellitus due to underlying condition with unspecified complications: Secondary | ICD-10-CM

## 2021-08-01 DIAGNOSIS — S90111D Contusion of right great toe without damage to nail, subsequent encounter: Secondary | ICD-10-CM

## 2021-08-01 DIAGNOSIS — M79671 Pain in right foot: Secondary | ICD-10-CM

## 2021-08-01 NOTE — Progress Notes (Signed)
Subjective: Sean [name]. is a 70 y.o. male patient who presents to office for follow up evaluation of Right great toe pain/fracture.  States no pain at all on toe. Patient is diabetic with blood sugar today 114 and last A1c unknown. Patient reports that he is still dealing with heart issues and will have to wait 1 month before they do anything about his back issues.  No other pedal complaints or medical concerns at this time.  Patient Active Problem List   Diagnosis Date Noted   Atrial fibrillation ([place]) 07/19/2021   OSA (obstructive sleep apnea)    Atrial flutter ([place])    Melena 08/03/2020   Barrett's esophagus 24/23/5361   Nonalcoholic fatty liver disease 08/03/2020   History of colonic polyps 08/03/2020   Submucosal lesion of stomach 08/03/2020   PAF (paroxysmal atrial fibrillation) ([place]) 02/23/2020   Cardiomyopathy ([place]) 02/23/2020   S/P ablation of atrial fibrillation 02/23/2020   Hypogonadism in male 02/22/2020   Impotence 02/22/2020   Hemospermia 02/22/2020   Incomplete emptying of bladder 02/22/2020   Secondary hypercoagulable state ([place]) 01/22/2020   Upper airway cough syndrome 12/15/2019   DOE (dyspnea on exertion) 10/29/2019   Essential hypertension 09/29/2019   Mixed dyslipidemia 09/29/2019   Diabetes mellitus due to underlying condition with unspecified complications ([place]) 44/31/5400   History of pulmonary embolism 09/29/2019   Morbid obesity ([place]) 09/29/2019   Benign prostatic hyperplasia with urinary obstruction 11/29/2018   GERD (gastroesophageal reflux disease) 11/29/2018   IBS (irritable bowel syndrome) 11/29/2018   Fissure in skin of foot 12/11/2016   Overgrown toenails 12/11/2016   Plantar fasciitis, bilateral 06/05/2016    Current Outpatient Medications on File Prior to Visit  Medication Sig Dispense Refill   albuterol (PROVENTIL) (2.5 MG/3ML) 0.083% nebulizer solution Take 2.5 mg by nebulization every 6 (six) hours as needed for wheezing or shortness  of breath.      albuterol (VENTOLIN HFA) 108 (90 Base) MCG/ACT inhaler Inhale 2 puffs into the lungs every 6 (six) hours as needed for wheezing or shortness of breath.      allopurinol (ZYLOPRIM) 100 MG tablet Take 100 mg by mouth in the morning.     Ascorbic Acid (VITAMIN C) 1000 MG tablet Take 1,000 mg by mouth 2 (two) times daily.     atorvastatin (LIPITOR) 20 MG tablet Take 1 tablet (20 mg total) by mouth in the morning. 90 tablet 1   Cholecalciferol (VITAMIN D3) 50 MCG (2000 UT) TABS Take 2,000 Units by mouth 2 (two) times daily.      ELIQUIS 5 MG TABS tablet Take 1 tablet (5 mg total) by mouth 2 (two) times daily. 180 tablet 1   empagliflozin (JARDIANCE) 25 MG TABS tablet Take 25 mg by mouth in the morning.     Eszopiclone 3 MG TABS Take 3 mg by mouth at bedtime.     fluticasone (FLONASE) 50 MCG/ACT nasal spray Place 2 sprays into both nostrils daily as needed for allergies or rhinitis.     furosemide (LASIX) 40 MG tablet Take 40 mg by mouth daily as needed for fluid.     gabapentin (NEURONTIN) 300 MG capsule Take 300 mg by mouth at bedtime.     Glucosamine-Chondroitin (COSAMIN DS PO) Take 1 tablet by mouth in the morning and at bedtime.     insulin lispro (HUMALOG) 100 UNIT/ML injection Inject 0-10 Units into the skin 3 (three) times daily as needed for high blood sugar. Sliding Scale     metoprolol succinate (  TOPROL-XL) 100 MG 24 hr tablet Take 100 mg by mouth at bedtime.     metoprolol succinate (TOPROL-XL) 50 MG 24 hr tablet Take 1 tablet (50 mg total) by mouth daily. Take with or immediately following a meal. 90 tablet 2   montelukast (SINGULAIR) 10 MG tablet Take 10 mg by mouth at bedtime.     Omega-3 Fatty Acids (FISH OIL) 1200 MG CAPS Take 1,200 mg by mouth 2 (two) times daily.      omeprazole (PRILOSEC) 40 MG capsule Take 40 mg by mouth 2 (two) times daily.      rOPINIRole (REQUIP) 2 MG tablet Take 2 mg by mouth at bedtime.     sacubitril-valsartan (ENTRESTO) 24-26 MG Take 1 tablet  by mouth 2 (two) times daily. 60 tablet 11   tamsulosin (FLOMAX) 0.4 MG CAPS capsule Take 0.4 capsules by mouth 2 (two) times daily.     TOUJEO MAX SOLOSTAR 300 UNIT/ML Solostar Pen Inject 94 Units into the skin at bedtime.     traMADol (ULTRAM) 50 MG tablet Take 50 mg by mouth daily as needed for moderate pain.      TURMERIC PO Take 1,000 mg by mouth in the morning and at bedtime.     venlafaxine XR (EFFEXOR-XR) 150 MG 24 hr capsule Take 150 mg by mouth in the morning.     Vitamin D, Ergocalciferol, (DRISDOL) 1.25 MG (50000 UT) CAPS capsule Take 50,000 Units by mouth every Tuesday. IN THE MORNING     No current facility-administered medications on file prior to visit.    Allergies  Allergen Reactions   Prednisone Other (See Comments)    nervous   Victoza [Liraglutide] Diarrhea    Objective:  General: Alert and oriented x3 in no acute distress  Dermatology: No open lesions bilateral lower extremities, no webspace macerations, Right hallux nail well-healed, no signs of infection. No lysis.   Vascular: No edema noted to right great toe.  DP and PT pedal pulses faintly palpable 1 out of 4 bilateral with chronic edema noted to bilateral ankles, Capillary Fill Time 3 seconds,(+) pedal hair growth bilateral, Temperature gradient within normal limits.  Neurology: Gross sensation intact via light touch bilateral.  Protective sensation intact to toes with Semmes Weinstein monofilament.  Musculoskeletal: There is no tenderness to palpation at the right hallux IPJ.  There is no obvious digital deformity, no limitation with range of motion to the first toe on right.  Assessment and Plan: Problem List Items Addressed This Visit       Endocrine   Diabetes mellitus due to underlying condition with unspecified complications ([place])   Other Visit Diagnoses     Closed non-physeal fracture of phalanx of right great toe with routine healing, unspecified phalanx, subsequent encounter    -  Primary    Relevant Orders   DG Foot Complete Right   Contusion of right great toe without damage to nail, subsequent encounter       Relevant Orders   DG Foot Complete Right   Right foot pain            -Complete examination performed -Xrays reviewed with fracture at phalanx now healed -Discussed long-term care for right hallux healed fracture -Continue with good supportive shoes daily for foot type -May slowly increase activities to tolerance since fracture is now healed -Recommend close monitoring and if symptoms worsen or fail to continue to improve to return to office as needed.  Landis Martins, DPM

## 2021-08-11 DIAGNOSIS — G4700 Insomnia, unspecified: Secondary | ICD-10-CM | POA: Diagnosis not present

## 2021-08-11 DIAGNOSIS — Z6841 Body Mass Index (BMI) 40.0 and over, adult: Secondary | ICD-10-CM | POA: Diagnosis not present

## 2021-08-11 DIAGNOSIS — I10 Essential (primary) hypertension: Secondary | ICD-10-CM | POA: Diagnosis not present

## 2021-08-11 DIAGNOSIS — M1990 Unspecified osteoarthritis, unspecified site: Secondary | ICD-10-CM | POA: Diagnosis not present

## 2021-08-11 DIAGNOSIS — D649 Anemia, unspecified: Secondary | ICD-10-CM | POA: Diagnosis not present

## 2021-08-11 DIAGNOSIS — K219 Gastro-esophageal reflux disease without esophagitis: Secondary | ICD-10-CM | POA: Diagnosis not present

## 2021-08-16 ENCOUNTER — Encounter (HOSPITAL_COMMUNITY): Payer: Self-pay | Admitting: Physician Assistant

## 2021-08-16 ENCOUNTER — Other Ambulatory Visit: Payer: Self-pay

## 2021-08-16 ENCOUNTER — Ambulatory Visit (HOSPITAL_COMMUNITY)
Admission: RE | Admit: 2021-08-16 | Discharge: 2021-08-16 | Disposition: A | Discharge status: Home / Self Care | Payer: Medicare Other | Source: Ambulatory Visit | Attending: Physician Assistant | Admitting: Physician Assistant

## 2021-08-16 VITALS — BP 132/80 | HR 56 | Ht 74.0 in | Wt 313.2 lb

## 2021-08-16 DIAGNOSIS — Z6841 Body Mass Index (BMI) 40.0 and over, adult: Secondary | ICD-10-CM | POA: Diagnosis not present

## 2021-08-16 DIAGNOSIS — E669 Obesity, unspecified: Secondary | ICD-10-CM | POA: Insufficient documentation

## 2021-08-16 DIAGNOSIS — I4819 Other persistent atrial fibrillation: Secondary | ICD-10-CM | POA: Diagnosis not present

## 2021-08-16 DIAGNOSIS — Z713 Dietary counseling and surveillance: Secondary | ICD-10-CM | POA: Diagnosis not present

## 2021-08-16 DIAGNOSIS — I110 Hypertensive heart disease with heart failure: Secondary | ICD-10-CM | POA: Diagnosis not present

## 2021-08-16 DIAGNOSIS — D6869 Other thrombophilia: Secondary | ICD-10-CM | POA: Insufficient documentation

## 2021-08-16 DIAGNOSIS — I5022 Chronic systolic (congestive) heart failure: Secondary | ICD-10-CM | POA: Diagnosis not present

## 2021-08-16 DIAGNOSIS — Z7901 Long term (current) use of anticoagulants: Secondary | ICD-10-CM | POA: Insufficient documentation

## 2021-08-16 DIAGNOSIS — Z8249 Family history of ischemic heart disease and other diseases of the circulatory system: Secondary | ICD-10-CM | POA: Insufficient documentation

## 2021-08-16 NOTE — Progress Notes (Signed)
Primary Care Physician: [name], MD Primary Cardiologist: Dr Geraldo Pitter Primary Electrophysiologist: Dr Curt Bears Referring Physician: Dr Jeri Lager Sean [name]. is a 70 y.o. male with a history of hypertension, hyperlipidemia, CHF, and atrial fibrillation who presents for follow up in the [facility].  He had an attempted cardioversion 11/05/2019 but unfortunately did not return to sinus rhythm despite amiodarone.  He has been having symptoms of weakness, fatigue and shortness of breath. He had an echocardiogram that showed an ejection fraction of 35 to 40%. Patient is on Eliquis for a CHADS2VASC score of 4. Patient is s/p afib ablation with Dr Curt Bears on 12/25/19 with normalization of EF.  He was seen at the ED 04/12/21 with an elevated heart rate and found to be back in afib. Of note, he was also diagnosed with COVID around that time. He was rate controlled with cardiology follow up. He was set up for outpatient DCCV on 04/17/21. Unfortunately, patient was noted to be back in afib as part of preoperative risk evaluation for a lumbar steroid injection.   On follow up today, patient is s/p repeat ablation with Dr Curt Bears on 07/20/21. Patient reports that he has done very well since the procedure. He has not had any further heart racing. He denies CP, swallowing pain, or groin issues. His primary concern is his chronic back pain.   Today, he denies symptoms of palpitations, chest pain, shortness of breath, orthopnea, PND, lower extremity edema, dizziness, presyncope, syncope, snoring, daytime somnolence, bleeding, or neurologic sequela. The patient is tolerating medications without difficulties and is otherwise without complaint today.    Atrial Fibrillation Risk Factors:  he does not have symptoms or diagnosis of sleep apnea. he does not have a history of rheumatic fever.   he has a BMI of Body mass index is 40.21 kg/m.Marland Kitchen Filed Weights   08/16/21 0947  Weight:  (!) 142.1 kg     Family History  Problem Relation Age of Onset   Hyperlipidemia Father    Hypertension Father    Colon cancer Father    Liver cancer Father    Lung cancer Father      Atrial Fibrillation Management history:  Previous antiarrhythmic drugs: amiodarone Previous cardioversions: 11/05/19, 04/17/21 Previous ablations: 12/25/19, 07/20/21 CHADS2VASC score: 4 Anticoagulation history: Eliquis   Past Medical History:  Diagnosis Date   Allergic rhinitis    Arrhythmia    Atrial fibrillation ([place])    Benign prostatic hyperplasia with urinary obstruction 11/29/2018   CHF (congestive heart failure) ([place])    Diabetes mellitus due to underlying condition with unspecified complications ([place]) 17/04/9389   Diabetes mellitus without complication ([facility])    DOE (dyspnea on exertion) 10/29/2019   S/p PE  11/2018 > DOAC recurrent 09/14/2019 off DOAC so resumed  - onset variable doe and orthostatic lightheadedness 06/2019 with afib - PFT's [place]  10/08/2019 :  FEV1 2.79 (73%) with ratio 75 and 6 % better p saba,  Air trapping on lung vol,  ERV 14% and dlco 22.78 (60%) corrects to 3.68 (76%) for vol with minimal concavity to f/v loop  - 10/29/2019   Walked RA x two laps =  approx 526ft @ avg   Essential hypertension 09/29/2019   Fissure in skin of foot 12/11/2016   GERD (gastroesophageal reflux disease) 11/29/2018   Hemospermia 02/22/2020   History of pulmonary embolism 09/29/2019   Hyperlipidemia    Hypertension    Hypogonadism in male 02/22/2020   IBS (irritable  bowel syndrome) 11/29/2018   Impotence 02/22/2020   Incomplete emptying of bladder 02/22/2020   Mixed dyslipidemia 09/29/2019   Morbid obesity ([place]) 09/29/2019   OSA on CPAP    Overgrown toenails 12/11/2016   Persistent atrial fibrillation ([place]) 09/29/2019   Plantar fasciitis, bilateral 06/05/2016   Secondary hypercoagulable state ([place]) 01/22/2020   Upper airway cough syndrome 12/15/2019   Noted on exam 12/15/2019 onset while on Entresto,  resolved with purse lip   Past Surgical History:  Procedure Laterality Date   ATRIAL FIBRILLATION ABLATION N/A 12/25/2019   Procedure: ATRIAL FIBRILLATION ABLATION;  Surgeon: [name], MD;  Location: [facility];  Service: Cardiovascular;  Laterality: N/A;   ATRIAL FIBRILLATION ABLATION N/A 07/19/2021   Procedure: ATRIAL FIBRILLATION ABLATION;  Surgeon: [name], MD;  Location: [facility];  Service: Cardiovascular;  Laterality: N/A;   BIOPSY  08/11/2020   Procedure: BIOPSY;  Surgeon: [name], MD;  Location: [facility];  Service: Endoscopy;;   CARDIAC CATHETERIZATION     CARDIOVERSION N/A 04/17/2021   Procedure: CARDIOVERSION;  Surgeon: [name], MD;  Location: [facility];  Service: Cardiovascular;  Laterality: N/A;   ESOPHAGOGASTRODUODENOSCOPY (EGD) WITH PROPOFOL N/A 08/11/2020   Procedure: ESOPHAGOGASTRODUODENOSCOPY (EGD) WITH PROPOFOL;  Surgeon: [name], MD;  Location: [facility];  Service: Endoscopy;  Laterality: N/A;   KNEE SURGERY     PILONIDAL CYST / SINUS EXCISION     UPPER ESOPHAGEAL ENDOSCOPIC ULTRASOUND (EUS) N/A 08/11/2020   Procedure: UPPER ESOPHAGEAL ENDOSCOPIC ULTRASOUND (EUS);  Surgeon: [name], MD;  Location: [facility];  Service: Endoscopy;  Laterality: N/A;    Current Outpatient Medications  Medication Sig Dispense Refill   albuterol (PROVENTIL) (2.5 MG/3ML) 0.083% nebulizer solution Take 2.5 mg by nebulization every 6 (six) hours as needed for wheezing or shortness of breath.      albuterol (VENTOLIN HFA) 108 (90 Base) MCG/ACT inhaler Inhale 2 puffs into the lungs every 6 (six) hours as needed for wheezing or shortness of breath.      allopurinol (ZYLOPRIM) 100 MG tablet Take 100 mg by mouth in the morning.     Ascorbic Acid (VITAMIN C) 1000 MG tablet Take 1,000 mg by mouth 2 (two) times daily.     atorvastatin (LIPITOR) 20 MG tablet Take 1 tablet (20 mg total) by mouth in the morning. 90 tablet 1    Cholecalciferol (VITAMIN D3) 50 MCG (2000 UT) TABS Take 2,000 Units by mouth 2 (two) times daily.      ELIQUIS 5 MG TABS tablet Take 1 tablet (5 mg total) by mouth 2 (two) times daily. 180 tablet 1   empagliflozin (JARDIANCE) 25 MG TABS tablet Take 25 mg by mouth in the morning.     Eszopiclone 3 MG TABS Take 3 mg by mouth at bedtime.     fluticasone (FLONASE) 50 MCG/ACT nasal spray Place 2 sprays into both nostrils daily as needed for allergies or rhinitis.     furosemide (LASIX) 40 MG tablet Take 40 mg by mouth daily as needed for fluid.     gabapentin (NEURONTIN) 300 MG capsule Take 300 mg by mouth at bedtime.     Glucosamine-Chondroitin (COSAMIN DS PO) Take 1 tablet by mouth in the morning and at bedtime.     insulin lispro (HUMALOG) 100 UNIT/ML injection Inject 0-10 Units into the skin 3 (three) times daily as needed for high blood sugar. Sliding Scale     metoprolol succinate (TOPROL-XL) 100 MG 24  hr tablet Take 100 mg by mouth at bedtime.     metoprolol succinate (TOPROL-XL) 50 MG 24 hr tablet Take 1 tablet (50 mg total) by mouth daily. Take with or immediately following a meal. 90 tablet 2   montelukast (SINGULAIR) 10 MG tablet Take 10 mg by mouth at bedtime.     Omega-3 Fatty Acids (FISH OIL) 1200 MG CAPS Take 1,200 mg by mouth 2 (two) times daily.      omeprazole (PRILOSEC) 40 MG capsule Take 40 mg by mouth 2 (two) times daily.      rOPINIRole (REQUIP) 2 MG tablet Take 2 mg by mouth at bedtime.     sacubitril-valsartan (ENTRESTO) 24-26 MG Take 1 tablet by mouth 2 (two) times daily. 60 tablet 11   tamsulosin (FLOMAX) 0.4 MG CAPS capsule Take 0.4 capsules by mouth 2 (two) times daily.     TOUJEO MAX SOLOSTAR 300 UNIT/ML Solostar Pen Inject 94 Units into the skin at bedtime.     traMADol (ULTRAM) 50 MG tablet Take 50 mg by mouth daily as needed for moderate pain.      TURMERIC PO Take 1,000 mg by mouth in the morning and at bedtime.     venlafaxine XR (EFFEXOR-XR) 150 MG 24 hr capsule  Take 150 mg by mouth in the morning.     Vitamin D, Ergocalciferol, (DRISDOL) 1.25 MG (50000 UT) CAPS capsule Take 50,000 Units by mouth every Tuesday. IN THE MORNING     No current facility-administered medications for this encounter.    Allergies  Allergen Reactions   Prednisone Other (See Comments)    nervous   Victoza [Liraglutide] Diarrhea    Social History   Socioeconomic History   Marital status: Married    Spouse name: Not on file   Number of children: Not on file   Years of education: Not on file   Highest education level: Not on file  Occupational History   Not on file  Tobacco Use   Smoking status: Never   Smokeless tobacco: Never  Vaping Use   Vaping Use: Never used  Substance and Sexual Activity   Alcohol use: Not Currently   Drug use: Never   Sexual activity: Not on file  Other Topics Concern   Not on file  Social History Narrative   Not on file   Social Determinants of Health   Financial Resource Strain: Not on file  Food Insecurity: Not on file  Transportation Needs: Not on file  Physical Activity: Not on file  Stress: Not on file  Social Connections: Not on file  Intimate Partner Violence: Not on file     ROS- All systems are reviewed and negative except as per the HPI above.  Physical Exam: Vitals:   08/16/21 0947  BP: 132/80  Pulse: (!) 56  Weight: (!) 142.1 kg  Height: 6\' 2"  (1.88 m)    GEN- The patient is a well appearing obese male, alert and oriented x 3 today.   HEENT-head normocephalic, atraumatic, sclera clear, conjunctiva pink, hearing intact, trachea midline. Lungs- Clear to ausculation bilaterally, normal work of breathing Heart- Regular rate and rhythm, no murmurs, rubs or gallops  GI- soft, NT, ND, + BS Extremities- no clubbing, cyanosis, or edema MS- no significant deformity or atrophy Skin- no rash or lesion Psych- euthymic mood, full affect Neuro- strength and sensation are intact   Wt Readings from Last 3  Encounters:  08/16/21 (!) 142.1 kg  07/19/21 (!) 144.1 kg  07/18/21 Marland Kitchen)  141.2 kg    EKG today demonstrates  SB, 1st degree AV block Vent. rate 56 BPM PR interval 268 ms QRS duration 90 ms QT/QTcB 446/430 ms  Echo 03/11/20 demonstrated   1. Left ventricular ejection fraction, by estimation, is 60 to 65%. The  left ventricle has normal function. The left ventricle has no regional  wall motion abnormalities. Left ventricular diastolic parameters are  consistent with Grade I diastolic  dysfunction (impaired relaxation).   Epic records are reviewed at length today  CHA2DS2-VASc Score = 5  The patient's score is based upon: CHF History: 1 HTN History: 1 Diabetes History: 1 Stroke History: 0 Vascular Disease History: 1 Age Score: 1 Gender Score: 0        ASSESSMENT AND PLAN: 1. Persistent Atrial Fibrillation/atrial flutter The patient's CHA2DS2-VASc score is 5, indicating a 7.2% annual risk of stroke.   S/p ablation with Dr Curt Bears 12/25/19 with repeat ablation 07/20/21 Patient appears to be maintaining SR. Continue metoprolol 50 mg AM and 100 mg PM Continue Eliquis 5 mg BID with no missed doses for 3 months post ablation.   2. Secondary Hypercoagulable State (ICD10:  D68.69) The patient is at significant risk for stroke/thromboembolism based upon his CHA2DS2-VASc Score of 5.  Continue Apixaban (Eliquis).   3. Obesity Body mass index is 40.21 kg/m. Lifestyle modification was discussed and encouraged including regular physical activity and weight reduction.  4. Chronic systolic CHF NICM, normalized after ablation.  No signs or symptoms of fluid overload.  5. HTN Stable, no changes today.   Follow up with Dr Curt Bears as scheduled.    [facility] [address] [place], [place] [address] [phone] 08/16/2021 10:01 AM

## 2021-09-06 DIAGNOSIS — M545 Low back pain, unspecified: Secondary | ICD-10-CM | POA: Diagnosis not present

## 2021-09-06 DIAGNOSIS — G4700 Insomnia, unspecified: Secondary | ICD-10-CM | POA: Diagnosis not present

## 2021-09-06 DIAGNOSIS — I10 Essential (primary) hypertension: Secondary | ICD-10-CM | POA: Diagnosis not present

## 2021-09-06 DIAGNOSIS — G4730 Sleep apnea, unspecified: Secondary | ICD-10-CM | POA: Diagnosis not present

## 2021-09-25 ENCOUNTER — Encounter: Payer: Self-pay | Admitting: Cardiology

## 2021-09-25 DIAGNOSIS — E1121 Type 2 diabetes mellitus with diabetic nephropathy: Secondary | ICD-10-CM | POA: Diagnosis not present

## 2021-09-25 DIAGNOSIS — G4700 Insomnia, unspecified: Secondary | ICD-10-CM | POA: Diagnosis not present

## 2021-10-10 ENCOUNTER — Telehealth: Payer: Self-pay | Admitting: Cardiology

## 2021-10-10 NOTE — Telephone Encounter (Signed)
Pt c/o medication issue:  1. Name of Medication: sacubitril-valsartan (ENTRESTO) 24-26 MG  2. How are you currently taking this medication (dosage and times per day)? 1 tablet twice a day  3. Are you having a reaction (difficulty breathing--STAT)? no  4. What is your medication issue? Otila Kluver from [facility] calling to speak with a nurse, because the the medication requires a prior authorization.

## 2021-10-12 ENCOUNTER — Telehealth: Payer: Self-pay | Admitting: *Deleted

## 2021-10-12 NOTE — Telephone Encounter (Signed)
° °  Pre-operative Risk Assessment    Patient Name: Sean [name].  DOB: 07/22/1951 MRN: [id number]      Request for Surgical Clearance    Procedure:   LUMBAR ESI  Date of Surgery:  Clearance TBD                                 Surgeon:  NOT LISTED Surgeon's Group or Practice Name:  [facility] Phone number:  [phone] Fax number:  [phone] ATTN: CASEY   Type of Clearance Requested:   - Medical  - Pharmacy:  Hold Apixaban (Eliquis) x 48 HOURS PRIOR   Type of Anesthesia:  Not Indicated   Additional requests/questions:    Jiles Prows   10/12/2021, 5:47 PM

## 2021-10-13 NOTE — Telephone Encounter (Signed)
Clinical pharmacist to review Eliquis.  Patient underwent atrial fibrillation ablation on 07/19/2021

## 2021-10-16 NOTE — Telephone Encounter (Signed)
° °  Primary Cardiologist: Will Meredith Leeds, MD  Chart reviewed as part of pre-operative protocol coverage. Given past medical history and time since last visit, based on ACC/AHA guidelines, Sean [name]. would be at acceptable risk for the planned procedure without further cardiovascular testing.   Patient with diagnosis of atrial fibrillation on Eliquis for anticoagulation.     Procedure: lumbar ESI Date of procedure: TBD     CHA2DS2-VASc Score = 5   This indicates a 7.2% annual risk of stroke. 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  (3 days per our office protocol)   Patient will not need bridging with Lovenox (enoxaparin) around procedure.  Please resume Eliquis as soon as hemostasis achieved post-procedure, at the discretion of the surgeon.     I will route this recommendation to the requesting party via Epic fax function and remove from pre-op pool.  Please call with questions.  Lenna Sciara, NP 10/16/2021, 3:58 PM

## 2021-10-16 NOTE — Telephone Encounter (Signed)
Patient with diagnosis of atrial fibrillation on Eliquis for anticoagulation.    Procedure: lumbar ESI Date of procedure: TBD   CHA2DS2-VASc Score = 5   This indicates a 7.2% annual risk of stroke. 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  (3 days per our office protocol)  Patient will not need bridging with Lovenox (enoxaparin) around procedure.

## 2021-10-18 ENCOUNTER — Telehealth: Payer: Self-pay

## 2021-10-18 DIAGNOSIS — K3189 Other diseases of stomach and duodenum: Secondary | ICD-10-CM | POA: Diagnosis not present

## 2021-10-18 DIAGNOSIS — K760 Fatty (change of) liver, not elsewhere classified: Secondary | ICD-10-CM | POA: Diagnosis not present

## 2021-10-18 DIAGNOSIS — K2270 Barrett's esophagus without dysplasia: Secondary | ICD-10-CM | POA: Diagnosis not present

## 2021-10-18 DIAGNOSIS — K219 Gastro-esophageal reflux disease without esophagitis: Secondary | ICD-10-CM | POA: Diagnosis not present

## 2021-10-18 NOTE — Telephone Encounter (Signed)
° °  Pre-operative Risk Assessment    Patient Name: Sean [name].  DOB: Nov 26, 1950 MRN: [id number]      Request for Surgical Clearance    Procedure:   Colonoscopy  Date of Surgery:  Clearance 11/14/21                                 Surgeon:  Dr.Timothy Misenheimer Surgeon's Group or Practice Name:  [facility] Phone number:  [phone] Fax number:  [phone]   Type of Clearance Requested:   - Pharmacy:  Hold Apixaban (Eliquis) on January 15 th   Type of Anesthesia:  Not Indicated   Additional requests/questions:    Gretchen Short   10/18/2021, 3:39 PM

## 2021-10-18 NOTE — Telephone Encounter (Signed)
Patient with diagnosis of A Fib on Eliquis for anticoagulation.    Procedure: colonoscopy Date of procedure: 11/14/21   CHA2DS2-VASc Score = 5  This indicates a 7.2% annual risk of stroke. The patient's score is based upon: CHF History: 1 HTN History: 1 Diabetes History: 1 Stroke History: 0 Vascular Disease History: 1 Age Score: 1 Gender Score: 0    CrCl 85 mL/min Platelet count 187K  Per office protocol, patient can hold Eliquis for 1 days prior to procedure.

## 2021-10-19 NOTE — Telephone Encounter (Signed)
° °  Primary Cardiologist: Will Meredith Leeds, MD  Chart reviewed as part of pre-operative protocol coverage. Given past medical history and time since last visit, based [name], Sean [name]. would be at acceptable risk for the planned procedure without further cardiovascular testing.   Patient with diagnosis of A Fib [name] Eliquis for anticoagulation.     Procedure: colonoscopy Date of procedure: 11/14/21     CHA2DS2-VASc Score = 5  This indicates a 7.2% annual risk of stroke. The patient's score is based upon: CHF History: 1 HTN History: 1 Diabetes History: 1 Stroke History: 0 Vascular Disease History: 1 Age Score: 1 Gender Score: 0     CrCl 85 mL/min Platelet count 187K   Per office protocol, patient can hold Eliquis for 1 days prior to procedure.  I will route this recommendation to the requesting party via Epic fax function and remove from pre-op pool.  Please call with questions.  Sean [name]. Sean Fichera NP-C    10/19/2021, 7:14 AM Sean [name] [address] Office [phone] Fax [phone]

## 2021-11-01 DIAGNOSIS — J069 Acute upper respiratory infection, unspecified: Secondary | ICD-10-CM | POA: Diagnosis not present

## 2021-11-01 DIAGNOSIS — G4700 Insomnia, unspecified: Secondary | ICD-10-CM | POA: Diagnosis not present

## 2021-11-01 DIAGNOSIS — J019 Acute sinusitis, unspecified: Secondary | ICD-10-CM | POA: Diagnosis not present

## 2021-11-01 DIAGNOSIS — G2581 Restless legs syndrome: Secondary | ICD-10-CM | POA: Diagnosis not present

## 2021-11-01 NOTE — Telephone Encounter (Signed)
Our office receive a duplicate clearance request. I will re-fax the clearance notes from pre op provider and Pharm-D in regard to holding Eliquis recommendations.

## 2021-11-05 NOTE — Progress Notes (Signed)
Electrophysiology Office Note   Date:  11/06/2021   ID:  Sean Bevens., DOB Jun 16, 1951, MRN [id number]  PCP:  [name], MD  Cardiologist:  [name] Primary Electrophysiologist:  [name], MD    Chief Complaint: AF   History of Present Illness: Sean [name]. is a 71 y.o. male who is being seen today for the evaluation of AF at the request of [name], MD. Presenting today for electrophysiology evaluation.  He has a history significant for hypertension, hyperlipidemia, CHF, atrial fibrillation.  He had attempted cardioversion 11/05/2019 but did not go back into normal rhythm.  He was having episodes of fatigue with an ejection fraction that was found to 35 to 40%.  He had ablation 12/25/2019.  Repeat echo showed normalization of his ejection fraction.  Unfortunately he went into an atypical atrial flutter and is now status post repeat ablation 07/19/2021.  Today, denies symptoms of palpitations, chest pain, shortness of breath, orthopnea, PND, lower extremity edema, claudication, dizziness, presyncope, syncope, bleeding, or neurologic sequela. The patient is tolerating medications without difficulties.  Since his ablation he has done well.  He has had no chest pain or shortness of breath.  Is able to all of his daily activities.  He has noted no further episodes of atrial fibrillation.  He is monitoring this via apple watch.  He is planned for upcoming colonoscopy endoscopy, and spine injection.   Past Medical History:  Diagnosis Date   Allergic rhinitis    Arrhythmia    Atrial fibrillation ([place])    Benign prostatic hyperplasia with urinary obstruction 11/29/2018   CHF (congestive heart failure) ([place])    Diabetes mellitus due to underlying condition with unspecified complications ([place]) 40/0/8676   Diabetes mellitus without complication ([facility])    DOE (dyspnea on exertion) 10/29/2019   S/p PE  11/2018 > DOAC recurrent 09/14/2019 off DOAC so resumed  - onset variable doe  and orthostatic lightheadedness 06/2019 with afib - PFT's Oval Linsey  10/08/2019 :  FEV1 2.79 (73%) with ratio 75 and 6 % better p saba,  Air trapping on lung vol,  ERV 14% and dlco 22.78 (60%) corrects to 3.68 (76%) for vol with minimal concavity to f/v loop  - 10/29/2019   Walked RA x two laps =  approx 553ft @ avg   Essential hypertension 09/29/2019   Fissure in skin of foot 12/11/2016   GERD (gastroesophageal reflux disease) 11/29/2018   Hemospermia 02/22/2020   History of pulmonary embolism 09/29/2019   Hyperlipidemia    Hypertension    Hypogonadism in male 02/22/2020   IBS (irritable bowel syndrome) 11/29/2018   Impotence 02/22/2020   Incomplete emptying of bladder 02/22/2020   Mixed dyslipidemia 09/29/2019   Morbid obesity ([place]) 09/29/2019   OSA on CPAP    Overgrown toenails 12/11/2016   Persistent atrial fibrillation ([place]) 09/29/2019   Plantar fasciitis, bilateral 06/05/2016   Secondary hypercoagulable state ([place]) 01/22/2020   Upper airway cough syndrome 12/15/2019   Noted on exam 12/15/2019 onset while on Entresto, resolved with purse lip   Past Surgical History:  Procedure Laterality Date   ATRIAL FIBRILLATION ABLATION N/A 12/25/2019   Procedure: ATRIAL FIBRILLATION ABLATION;  Surgeon: [name], MD;  Location: [facility];  Service: Cardiovascular;  Laterality: N/A;   ATRIAL FIBRILLATION ABLATION N/A 07/19/2021   Procedure: ATRIAL FIBRILLATION ABLATION;  Surgeon: [name], MD;  Location: [facility];  Service: Cardiovascular;  Laterality: N/A;   BIOPSY  08/11/2020  Procedure: BIOPSY;  Surgeon: [name], MD;  Location: [facility];  Service: Endoscopy;;   CARDIAC CATHETERIZATION     CARDIOVERSION N/A 04/17/2021   Procedure: CARDIOVERSION;  Surgeon: [name], MD;  Location: [facility];  Service: Cardiovascular;  Laterality: N/A;   ESOPHAGOGASTRODUODENOSCOPY (EGD) WITH PROPOFOL N/A 08/11/2020   Procedure: ESOPHAGOGASTRODUODENOSCOPY (EGD) WITH  PROPOFOL;  Surgeon: [name], MD;  Location: [facility];  Service: Endoscopy;  Laterality: N/A;   KNEE SURGERY     PILONIDAL CYST / SINUS EXCISION     UPPER ESOPHAGEAL ENDOSCOPIC ULTRASOUND (EUS) N/A 08/11/2020   Procedure: UPPER ESOPHAGEAL ENDOSCOPIC ULTRASOUND (EUS);  Surgeon: [name], MD;  Location: [facility];  Service: Endoscopy;  Laterality: N/A;     Current Outpatient Medications  Medication Sig Dispense Refill   albuterol (PROVENTIL) (2.5 MG/3ML) 0.083% nebulizer solution Take 2.5 mg by nebulization every 6 (six) hours as needed for wheezing or shortness of breath.      albuterol (VENTOLIN HFA) 108 (90 Base) MCG/ACT inhaler Inhale 2 puffs into the lungs every 6 (six) hours as needed for wheezing or shortness of breath.      allopurinol (ZYLOPRIM) 100 MG tablet Take 100 mg by mouth in the morning.     Ascorbic Acid (VITAMIN C) 1000 MG tablet Take 1,000 mg by mouth 2 (two) times daily.     atorvastatin (LIPITOR) 20 MG tablet Take 1 tablet (20 mg total) by mouth in the morning. 90 tablet 1   Cholecalciferol (VITAMIN D3) 50 MCG (2000 UT) TABS Take 2,000 Units by mouth 2 (two) times daily.      ELIQUIS 5 MG TABS tablet Take 1 tablet (5 mg total) by mouth 2 (two) times daily. 180 tablet 1   empagliflozin (JARDIANCE) 25 MG TABS tablet Take 25 mg by mouth in the morning.     Eszopiclone 3 MG TABS Take 3 mg by mouth at bedtime.     fluticasone (FLONASE) 50 MCG/ACT nasal spray Place 2 sprays into both nostrils daily as needed for allergies or rhinitis.     furosemide (LASIX) 40 MG tablet Take 40 mg by mouth daily as needed for fluid.     gabapentin (NEURONTIN) 300 MG capsule Take 300 mg by mouth at bedtime.     Glucosamine-Chondroitin (COSAMIN DS PO) Take 1 tablet by mouth in the morning and at bedtime.     insulin lispro (HUMALOG) 100 UNIT/ML injection Inject 0-10 Units into the skin 3 (three) times daily as needed for high blood sugar. Sliding Scale     metoprolol  succinate (TOPROL-XL) 100 MG 24 hr tablet Take 100 mg by mouth at bedtime.     metoprolol succinate (TOPROL-XL) 50 MG 24 hr tablet Take 1 tablet (50 mg total) by mouth daily. Take with or immediately following a meal. 90 tablet 2   montelukast (SINGULAIR) 10 MG tablet Take 10 mg by mouth at bedtime.     Omega-3 Fatty Acids (FISH OIL) 1200 MG CAPS Take 1,200 mg by mouth 2 (two) times daily.      omeprazole (PRILOSEC) 40 MG capsule Take 40 mg by mouth 2 (two) times daily.      rOPINIRole (REQUIP) 2 MG tablet Take 2 mg by mouth at bedtime.     sacubitril-valsartan (ENTRESTO) 24-26 MG Take 1 tablet by mouth 2 (two) times daily. 60 tablet 11   tamsulosin (FLOMAX) 0.4 MG CAPS capsule Take 0.4 capsules by mouth 2 (two) times daily.     TOUJEO MAX  SOLOSTAR 300 UNIT/ML Solostar Pen Inject 94 Units into the skin at bedtime.     traMADol (ULTRAM) 50 MG tablet Take 50 mg by mouth daily as needed for moderate pain.      TURMERIC PO Take 1,000 mg by mouth in the morning and at bedtime.     venlafaxine XR (EFFEXOR-XR) 150 MG 24 hr capsule Take 150 mg by mouth in the morning.     Vitamin D, Ergocalciferol, (DRISDOL) 1.25 MG (50000 UT) CAPS capsule Take 50,000 Units by mouth every Tuesday. IN THE MORNING     No current facility-administered medications for this visit.    Allergies:   Prednisone and Victoza [liraglutide]   Social History:  The patient  reports that he has never smoked. He has never used smokeless tobacco. He reports that he does not currently use alcohol. He reports that he does not use drugs.   Family History:  The patient's family history includes Colon cancer in his father; Hyperlipidemia in his father; Hypertension in his father; Liver cancer in his father; Lung cancer in his father.    ROS:  Please see the history of present illness.   Otherwise, review of systems is positive for none.   All other systems are reviewed and negative.   PHYSICAL EXAM: VS:  BP 136/72    Pulse (!) 53    Ht  6\' 2"  (1.88 m)    Wt (!) 315 lb 3.2 oz (143 kg)    SpO2 93%    BMI 40.47 kg/m  , BMI Body mass index is 40.47 kg/m. GEN: Well nourished, well developed, in no acute distress  HEENT: normal  Neck: no JVD, carotid bruits, or masses Cardiac: RRR; no murmurs, rubs, or gallops,no edema  Respiratory:  clear to auscultation bilaterally, normal work of breathing GI: soft, nontender, nondistended, + BS MS: no deformity or atrophy  Skin: warm and dry Neuro:  Strength and sensation are intact Psych: euthymic mood, full affect  EKG:  EKG is ordered today. Personal review of the ekg ordered shows sinus rhythm, rate 53, first-degree AV block  Recent Labs: 04/12/2021: B Natriuretic Peptide 219.3 04/13/2021: Magnesium 2.2; TSH 1.090 07/10/2021: BUN 15; Creatinine, Ser 1.18; Hemoglobin 14.8; Platelets 187; Potassium 4.2; Sodium 141    Lipid Panel     Component Value Date/Time   CHOL 135 10/09/2019 0820   TRIG 159 (H) 10/09/2019 0820   HDL 47 10/09/2019 0820   CHOLHDL 2.9 10/09/2019 0820   LDLCALC 61 10/09/2019 0820     Wt Readings from Last 3 Encounters:  11/06/21 (!) 315 lb 3.2 oz (143 kg)  08/16/21 (!) 313 lb 3.2 oz (142.1 kg)  07/19/21 (!) 317 lb 10.9 oz (144.1 kg)      Other studies Reviewed: Additional studies/ records that were reviewed today include: TTE 03/11/2020 Review of the above records today demonstrates:   1. Left ventricular ejection fraction, by estimation, is 60 to 65%. The  left ventricle has normal function. The left ventricle has no regional  wall motion abnormalities. Left ventricular diastolic parameters are  consistent with Grade I diastolic  dysfunction (impaired relaxation).   ASSESSMENT AND PLAN:  1.  Persistent atrial fibrillation/atrial flutter: Status post ablation 12/25/2019 with repeat ablation 07/19/2021.  Currently on Eliquis.  CHA2DS2-VASc of 3.  His most recent ablation was for an atypical atrial flutter.  He is remained in sinus rhythm.  2.   Chronic systolic heart failure due to nonischemic cardiomyopathy: Ejection fraction 35 to 40%.  Currently on metoprolol and Entresto.  Ejection fraction has normalized.  No obvious volume overload.  3.  Hypertension: Currently well controlled  Current medicines are reviewed at length with the patient today.   The patient does not have concerns regarding his medicines.  The following changes were made today: None  Labs/ tests ordered today include:  Orders Placed This Encounter  Procedures   EKG 12-Lead      Disposition:   FU with Keynan Heffern 3 months  Signed, Modena Bellemare Meredith Leeds, MD  11/06/2021 10:11 AM     [address] [place] [id number] [phone] (office) [phone] (fax)

## 2021-11-06 ENCOUNTER — Encounter: Payer: Self-pay | Admitting: Cardiology

## 2021-11-06 ENCOUNTER — Other Ambulatory Visit: Payer: Self-pay

## 2021-11-06 ENCOUNTER — Ambulatory Visit (INDEPENDENT_AMBULATORY_CARE_PROVIDER_SITE_OTHER): Payer: Medicare Other | Admitting: Cardiology

## 2021-11-06 VITALS — BP 136/72 | HR 53 | Ht 74.0 in | Wt 315.2 lb

## 2021-11-06 DIAGNOSIS — I4819 Other persistent atrial fibrillation: Secondary | ICD-10-CM | POA: Diagnosis not present

## 2021-11-06 NOTE — Patient Instructions (Addendum)
Medication Instructions:  Your physician recommends that you continue on your current medications as directed. Please refer to the Current Medication list given to you today.  *If you need a refill on your cardiac medications before your next appointment, please call your pharmacy*   Lab Work: None ordered   Testing/Procedures: None ordered   Follow-Up: At [facility], you and your health needs are our priority.  As part of our continuing mission to provide you with exceptional heart care, we have created designated Provider Care Teams.  These Care Teams include your primary Cardiologist (physician) and Advanced Practice Providers (APPs -  Physician Assistants and Nurse Practitioners) who all work together to provide you with the care you need, when you need it.  Your next appointment:   3 month(s)  The format for your next appointment:   In Person  Provider:   Will Camnitz, MD    Thank you for choosing [facility]!!   Jayant Kriz, RN [phone]     

## 2021-11-14 DIAGNOSIS — K317 Polyp of stomach and duodenum: Secondary | ICD-10-CM | POA: Diagnosis not present

## 2021-11-14 DIAGNOSIS — K2270 Barrett's esophagus without dysplasia: Secondary | ICD-10-CM | POA: Diagnosis not present

## 2021-11-14 DIAGNOSIS — Z1211 Encounter for screening for malignant neoplasm of colon: Secondary | ICD-10-CM | POA: Diagnosis not present

## 2021-11-14 DIAGNOSIS — K219 Gastro-esophageal reflux disease without esophagitis: Secondary | ICD-10-CM | POA: Diagnosis not present

## 2021-11-14 DIAGNOSIS — Z8711 Personal history of peptic ulcer disease: Secondary | ICD-10-CM | POA: Diagnosis not present

## 2021-11-14 DIAGNOSIS — Z800 Family history of malignant neoplasm of digestive organs: Secondary | ICD-10-CM | POA: Diagnosis not present

## 2021-11-14 DIAGNOSIS — D131 Benign neoplasm of stomach: Secondary | ICD-10-CM | POA: Diagnosis not present

## 2021-11-14 DIAGNOSIS — K921 Melena: Secondary | ICD-10-CM | POA: Diagnosis not present

## 2021-11-14 DIAGNOSIS — Z86010 Personal history of colonic polyps: Secondary | ICD-10-CM | POA: Diagnosis not present

## 2021-11-14 DIAGNOSIS — E119 Type 2 diabetes mellitus without complications: Secondary | ICD-10-CM | POA: Diagnosis not present

## 2021-11-14 DIAGNOSIS — I10 Essential (primary) hypertension: Secondary | ICD-10-CM | POA: Diagnosis not present

## 2021-11-14 DIAGNOSIS — K5730 Diverticulosis of large intestine without perforation or abscess without bleeding: Secondary | ICD-10-CM | POA: Diagnosis not present

## 2021-11-16 NOTE — Progress Notes (Signed)
HPI- M never smoker followed for OSA, complicated by A. Fib/ ablation, HTN, CHF, CM, Allergic Rhinitis, NASH, GERD/Barrett's, DM2, BPH, Morbid Obesity, COVID Infection 2022 NPSG 06/23/21- CPAP titrated to 16, body weight 300 lbs. Titration started at 13 cwp      ( AHI 16/ hr). Minimum O2 saturation on 16 cwp was 89%  ================================================================== 07/18/21- 51 yoM never smoker for sleep evaluation courtesy of Dr [name] with concern of OSA Medical problem list includes OSA, AFib, HTN, CHF, CM, Allergic Rhinitis, NASH, GERD/ Barretts, DM2, BPH, Morbid Obesity, Covid infection 2022,  Original diagnostic NPSG years ago in [place], [place]. NPSG 06/23/21- CPAP titrated to 16, body weight 300 lbs. Titration started at 13 cwp      ( AHI 16/ hr). Minimum O2 saturation on 16 cwp was 89%. Meds include Neb albuterol, Ventolin hfa Epworth score- Body weight today-311 lbs Covid vax-3 Moderna Flu vax-had -----OSA, review sleep study, uses cpap nightly CPAP--  auto 5-20 Dream Station 2/ [employer]   AHI 18 He appears to be having significant breakthrough events, but need updated download. He has been using every night. Nasal mask. He admits weight gain. Not sleeping well pending repeat cardiac ablation tomorrow.   ENT surgery limited to sinus surgery.    CXR 04/12/21-  IMPRESSION: Possible mild pulmonary vascular congestion. PFT Oval Linsey) 10/08/19- minimal obstruction, no response to BD, moderate Diffusion deficit  11/17/21- 70yoM never smoker followed for OSA, complicated by A. Fib/ ablation, HTN, CHF, CM, Allergic Rhinitis, NASH, GERD/Barrett's, DM2, BPH, Morbid Obesity, COVID Infection 2022 -Neb albuterol, Ventolin hfa,  CPAP auto 10-20 Dream Station 2/ [employer]  Download-pending Body weight today-310 lbs Covid vax-3 Moderna Flu vax- had -----Patient states that he is having issues with his machine, feels like he is not getting enough air.  We have contacted  his DME company about getting download.  He is pretty clear that his new DreamStation machine has not been functioning the same for about a month.  Less airflow. Most recent cardiac ablation about a month ago seems to be working so far.  ROS-see HPI   + = positive Constitutional:    weight loss, night sweats, fevers, chills, fatigue, lassitude. HEENT:    headaches, difficulty swallowing, tooth/dental problems, sore throat,       sneezing, itching, ear ache, nasal congestion, post nasal drip, snoring CV:    chest pain, orthopnea, PND, swelling in lower extremities, anasarca,                                   dizziness, palpitations Resp:   +shortness of breath with exertion or at rest.                productive cough,   non-productive cough, coughing up of blood.              change in color of mucus.  wheezing.   Skin:    rash or lesions. GI:  No-   heartburn, indigestion, abdominal pain, nausea, vomiting, diarrhea,                 change in bowel habits, loss of appetite GU: dysuria, change in color of urine, no urgency or frequency.   flank pain. MS:   joint pain, stiffness, decreased range of motion, back pain. Neuro-     nothing unusual Psych:  change in mood or affect.  depression or anxiety.  memory loss.  OBJ- Physical Exam General- Alert, Oriented, Affect-appropriate, Distress- none acute, + obese Skin- rash-none, lesions- none, excoriation+ mild on forearms Lymphadenopathy- none Head- atraumatic            Eyes- Gross vision intact, PERRLA, conjunctivae and secretions clear            Ears- Hearing, canals-normal            Nose- Clear, no-Septal dev, mucus, polyps, erosion, perforation             Throat- Mallampati III-IV , mucosa clear , drainage- none, tonsils- atrophic Neck- flexible , trachea midline, no stridor , thyroid nl, carotid no bruit Chest - symmetrical excursion , unlabored           Heart/CV- RR/ AFib , no murmur , no gallop  , no rub, nl s1 s2                            - JVD- none , edema- none, stasis changes- none, varices- none           Lung- clear to P&A, wheeze- none, cough- none , dullness-none, rub- none           Chest wall-  Abd-  Br/ Gen/ Rectal- Not done, not indicated Extrem- cyanosis- none, clubbing, none, atrophy- none, strength- nl Neuro- grossly intact to observation

## 2021-11-17 ENCOUNTER — Other Ambulatory Visit: Payer: Self-pay

## 2021-11-17 ENCOUNTER — Ambulatory Visit (INDEPENDENT_AMBULATORY_CARE_PROVIDER_SITE_OTHER): Payer: Medicare Other | Admitting: Internal Medicine

## 2021-11-17 ENCOUNTER — Encounter: Payer: Self-pay | Admitting: Internal Medicine

## 2021-11-17 DIAGNOSIS — I4819 Other persistent atrial fibrillation: Secondary | ICD-10-CM

## 2021-11-17 DIAGNOSIS — G4733 Obstructive sleep apnea (adult) (pediatric): Secondary | ICD-10-CM | POA: Diagnosis not present

## 2021-11-17 NOTE — Assessment & Plan Note (Signed)
It sounds as if something mechanical has changed about machine function in the short time he has had this replacement DreamStation.  If servicing it does not correct the issue, we can check an overnight oximetry, but I do not believe the changes in his health.  If anything ablation should have made him better. Plan-as continue CPAP auto 10-20, service machine and install download capability

## 2021-11-17 NOTE — Assessment & Plan Note (Signed)
He continues working with cardiology.  So far ablation is holding.  He is in sinus rhythm at today's exam.

## 2021-11-17 NOTE — Patient Instructions (Signed)
Order- DME [employer] please service CPAP machine air flow has changed- not enough . Need to install download capability.   If discomfort at night isn't corrected, we will order a checck of your oxygen levels while you sleep.

## 2021-11-18 ENCOUNTER — Other Ambulatory Visit: Payer: Self-pay | Admitting: Cardiology

## 2021-11-20 DIAGNOSIS — J069 Acute upper respiratory infection, unspecified: Secondary | ICD-10-CM | POA: Diagnosis not present

## 2021-11-20 DIAGNOSIS — I10 Essential (primary) hypertension: Secondary | ICD-10-CM | POA: Diagnosis not present

## 2021-11-20 DIAGNOSIS — G4700 Insomnia, unspecified: Secondary | ICD-10-CM | POA: Diagnosis not present

## 2021-11-20 DIAGNOSIS — D649 Anemia, unspecified: Secondary | ICD-10-CM | POA: Diagnosis not present

## 2021-11-20 DIAGNOSIS — Z6841 Body Mass Index (BMI) 40.0 and over, adult: Secondary | ICD-10-CM | POA: Diagnosis not present

## 2021-11-20 DIAGNOSIS — K219 Gastro-esophageal reflux disease without esophagitis: Secondary | ICD-10-CM | POA: Diagnosis not present

## 2021-11-20 DIAGNOSIS — M1990 Unspecified osteoarthritis, unspecified site: Secondary | ICD-10-CM | POA: Diagnosis not present

## 2021-11-21 ENCOUNTER — Telehealth: Payer: Self-pay | Admitting: Cardiology

## 2021-11-21 NOTE — Telephone Encounter (Signed)
See 10/12/21 encounter.  Ollen Gross with [facility] is following up. She states the patient informed that that he came off Eliquis for his colonoscopy and she would like to discuss this with pre-op if possible.   Phone #: [phone] (ext: [phone])

## 2021-11-22 NOTE — Telephone Encounter (Signed)
Left VM for [employer]

## 2021-12-14 DIAGNOSIS — M48061 Spinal stenosis, lumbar region without neurogenic claudication: Secondary | ICD-10-CM | POA: Diagnosis not present

## 2021-12-14 DIAGNOSIS — M47817 Spondylosis without myelopathy or radiculopathy, lumbosacral region: Secondary | ICD-10-CM | POA: Diagnosis not present

## 2021-12-14 DIAGNOSIS — M4727 Other spondylosis with radiculopathy, lumbosacral region: Secondary | ICD-10-CM | POA: Diagnosis not present

## 2021-12-18 ENCOUNTER — Encounter: Payer: Self-pay | Admitting: Cardiology

## 2021-12-18 DIAGNOSIS — G4700 Insomnia, unspecified: Secondary | ICD-10-CM | POA: Diagnosis not present

## 2021-12-18 DIAGNOSIS — E1121 Type 2 diabetes mellitus with diabetic nephropathy: Secondary | ICD-10-CM | POA: Diagnosis not present

## 2021-12-18 DIAGNOSIS — I4891 Unspecified atrial fibrillation: Secondary | ICD-10-CM | POA: Diagnosis not present

## 2021-12-19 NOTE — Telephone Encounter (Signed)
Pt scheduled to see Camnitz on Monday in the [facility] office. Patient verbalized understanding and agreeable to plan.

## 2021-12-25 ENCOUNTER — Other Ambulatory Visit: Payer: Self-pay

## 2021-12-25 ENCOUNTER — Encounter: Payer: Self-pay | Admitting: Cardiology

## 2021-12-25 ENCOUNTER — Ambulatory Visit (INDEPENDENT_AMBULATORY_CARE_PROVIDER_SITE_OTHER): Payer: Medicare Other | Admitting: Cardiology

## 2021-12-25 VITALS — BP 112/78 | HR 87 | Ht 74.0 in | Wt 308.0 lb

## 2021-12-25 DIAGNOSIS — I4819 Other persistent atrial fibrillation: Secondary | ICD-10-CM | POA: Diagnosis not present

## 2021-12-25 MED ORDER — FLECAINIDE ACETATE 100 MG PO TABS: 100.0000 mg | ORAL_TABLET | Freq: Two times a day (BID) | ORAL | 3 refills | Status: DC

## 2021-12-25 NOTE — Patient Instructions (Addendum)
Medication Instructions:  Your physician has recommended you make the following change in your medication:  START Flecainide 100 mg twice daily  *If you need a refill on your cardiac medications before your next appointment, please call your pharmacy*   Lab Work: None ordered   Testing/Procedures: Your physician has recommended that you have a Cardioversion (DCCV). Electrical Cardioversion uses a jolt of electricity to your heart either through paddles or wired patches attached to your chest. This is a controlled, usually prescheduled, procedure. Defibrillation is done under light anesthesia in the hospital, and you usually go home the day of the procedure. This is done to get your heart back into a normal rhythm. You are not awake for the procedure. Please see the instruction sheet below given to you today.   Follow-Up: At [facility], you and your health needs are our priority.  As part of our continuing mission to provide you with exceptional heart care, we have created designated Provider Care Teams.  These Care Teams include your primary Cardiologist (physician) and Advanced Practice Providers (APPs -  Physician Assistants and Nurse Practitioners) who all work together to provide you with the care you need, when you need it.  Your next appointment:   3 month(s)  The format for your next appointment:   In Person  Provider:   Allegra Lai, MD    Thank you for choosing [facility]!!   Trinidad Curet, RN [phone]   Other Instructions   CARDIOVERSION INSTRUCTIONS  You are scheduled for a Cardioversion on 01/10/2022 with Dr. Johney Frame.  Please arrive at the [facility] (Main Entrance A) at [facility]: [address] [place], [place] [address] at 10:00 am  DIET: Nothing to eat or drink after midnight except a sip of water with medications (see medication instructions below)  FYI: For your safety, and to allow [place] to monitor your vital signs accurately  during the surgery/procedure we request that   if you have artificial nails, gel coating, SNS etc. Please have those removed prior to your surgery/procedure. Not having the nail coverings /polish removed may result in cancellation or delay of your surgery/procedure.   Medication Instructions: Take 1/2 your bedtime insulin the night before this procedure   Hold all morning medications  Continue your anticoagulant: Eliquis You will need to continue your anticoagulant after your procedure until you  are told by your provider that it is safe to stop   Labs: your lab work will be done at the hospital prior to your procedure  You must have a responsible person to drive you home and stay in the waiting area during your procedure. Failure to do so could result in cancellation.  Bring your insurance cards.  *Special Note: Every effort is made to have your procedure done on time. Occasionally there are emergencies that occur at the hospital that may cause delays. Please be patient if a delay does occur.

## 2021-12-25 NOTE — Progress Notes (Signed)
Electrophysiology Office Note   Date:  12/25/2021   ID:  Sean Orth., DOB 1950/11/23, MRN [id number]  PCP:  [name], MD  Cardiologist:  [name] Primary Electrophysiologist:  Glennette Galster Meredith Leeds, MD    Chief Complaint: AF   History of Present Illness: Sean [name]. is a 70 y.o. male who is being seen today for the evaluation of AF at the request of [name], MD. Presenting today for electrophysiology evaluation.  He has a history significant for hypertension, hyperlipidemia, CHF, atrial fibrillation.  He had attempted cardioversion 11/05/2019 but did not go back into normal rhythm.  He was having episodes of fatigue with an ejection fraction of 35 to 40%.  He had an ablation 12/25/2019.  Repeat echo showed normalization of his ejection fraction.  He went into an atypical atrial flutter and is now status post ablation 07/19/2021.  Today, denies symptoms of palpitations, chest pain, shortness of breath, orthopnea, PND, lower extremity edema, claudication, dizziness, presyncope, syncope, bleeding, or neurologic sequela. The patient is tolerating medications without difficulties.  Since being seen he has unfortunately gone back into atrial fibrillation.  He had a back injection in the next day, had atrial fibrillation.  He has weakness and fatigue.  He would like to get back into normal rhythm.    Past Medical History:  Diagnosis Date   Allergic rhinitis    Arrhythmia    Atrial fibrillation ([place])    Benign prostatic hyperplasia with urinary obstruction 11/29/2018   CHF (congestive heart failure) ([place])    Diabetes mellitus due to underlying condition with unspecified complications ([place]) 18/02/6313   Diabetes mellitus without complication (HCC)    DOE (dyspnea on exertion) 10/29/2019   S/p PE  11/2018 > DOAC recurrent 09/14/2019 off DOAC so resumed  - onset variable doe and orthostatic lightheadedness 06/2019 with afib - PFT's Oval Linsey  10/08/2019 :  FEV1 2.79 (73%) with ratio 75  and 6 % better p saba,  Air trapping on lung vol,  ERV 14% and dlco 22.78 (60%) corrects to 3.68 (76%) for vol with minimal concavity to f/v loop  - 10/29/2019   Walked RA x two laps =  approx 546ft @ avg   Essential hypertension 09/29/2019   Fissure in skin of foot 12/11/2016   GERD (gastroesophageal reflux disease) 11/29/2018   Hemospermia 02/22/2020   History of pulmonary embolism 09/29/2019   Hyperlipidemia    Hypertension    Hypogonadism in male 02/22/2020   IBS (irritable bowel syndrome) 11/29/2018   Impotence 02/22/2020   Incomplete emptying of bladder 02/22/2020   Mixed dyslipidemia 09/29/2019   Morbid obesity ([place]) 09/29/2019   OSA on CPAP    Overgrown toenails 12/11/2016   Persistent atrial fibrillation ([place]) 09/29/2019   Plantar fasciitis, bilateral 06/05/2016   Secondary hypercoagulable state ([place]) 01/22/2020   Upper airway cough syndrome 12/15/2019   Noted on exam 12/15/2019 onset while on Entresto, resolved with purse lip   Past Surgical History:  Procedure Laterality Date   ATRIAL FIBRILLATION ABLATION N/A 12/25/2019   Procedure: ATRIAL FIBRILLATION ABLATION;  Surgeon: [name], MD;  Location: [facility];  Service: Cardiovascular;  Laterality: N/A;   ATRIAL FIBRILLATION ABLATION N/A 07/19/2021   Procedure: ATRIAL FIBRILLATION ABLATION;  Surgeon: [name], MD;  Location: [facility];  Service: Cardiovascular;  Laterality: N/A;   BIOPSY  08/11/2020   Procedure: BIOPSY;  Surgeon: [name], MD;  Location: [facility];  Service: Endoscopy;;   CARDIAC CATHETERIZATION  CARDIOVERSION N/A 04/17/2021   Procedure: CARDIOVERSION;  Surgeon: [name], MD;  Location: [facility];  Service: Cardiovascular;  Laterality: N/A;   ESOPHAGOGASTRODUODENOSCOPY (EGD) WITH PROPOFOL N/A 08/11/2020   Procedure: ESOPHAGOGASTRODUODENOSCOPY (EGD) WITH PROPOFOL;  Surgeon: [name], MD;  Location: [facility];  Service: Endoscopy;  Laterality: N/A;   KNEE  SURGERY     PILONIDAL CYST / SINUS EXCISION     UPPER ESOPHAGEAL ENDOSCOPIC ULTRASOUND (EUS) N/A 08/11/2020   Procedure: UPPER ESOPHAGEAL ENDOSCOPIC ULTRASOUND (EUS);  Surgeon: [name], MD;  Location: [facility];  Service: Endoscopy;  Laterality: N/A;     Current Outpatient Medications  Medication Sig Dispense Refill   albuterol (PROVENTIL) (2.5 MG/3ML) 0.083% nebulizer solution Take 2.5 mg by nebulization every 6 (six) hours as needed for wheezing or shortness of breath.      albuterol (VENTOLIN HFA) 108 (90 Base) MCG/ACT inhaler Inhale 2 puffs into the lungs every 6 (six) hours as needed for wheezing or shortness of breath.      allopurinol (ZYLOPRIM) 100 MG tablet Take 100 mg by mouth in the morning.     Ascorbic Acid (VITAMIN C) 1000 MG tablet Take 1,000 mg by mouth 2 (two) times daily.     atorvastatin (LIPITOR) 20 MG tablet Take 1 tablet (20 mg total) by mouth in the morning. 90 tablet 3   Cholecalciferol (VITAMIN D3) 50 MCG (2000 UT) TABS Take 2,000 Units by mouth 2 (two) times daily.      ELIQUIS 5 MG TABS tablet Take 1 tablet (5 mg total) by mouth 2 (two) times daily. 180 tablet 1   empagliflozin (JARDIANCE) 25 MG TABS tablet Take 25 mg by mouth in the morning.     Eszopiclone 3 MG TABS Take 3 mg by mouth at bedtime.     flecainide (TAMBOCOR) 100 MG tablet Take 1 tablet (100 mg total) by mouth 2 (two) times daily. 60 tablet 3   fluticasone (FLONASE) 50 MCG/ACT nasal spray Place 2 sprays into both nostrils daily as needed for allergies or rhinitis.     furosemide (LASIX) 40 MG tablet Take 40 mg by mouth daily as needed for fluid.     gabapentin (NEURONTIN) 300 MG capsule Take 300 mg by mouth at bedtime.     Glucosamine-Chondroitin (COSAMIN DS PO) Take 1 tablet by mouth in the morning and at bedtime.     insulin lispro (HUMALOG) 100 UNIT/ML injection Inject 0-10 Units into the skin 3 (three) times daily as needed for high blood sugar. Sliding Scale     metoprolol succinate  (TOPROL-XL) 100 MG 24 hr tablet Take 100 mg by mouth at bedtime.     metoprolol succinate (TOPROL-XL) 50 MG 24 hr tablet Take 1 tablet (50 mg total) by mouth daily. Take with or immediately following a meal. 90 tablet 2   montelukast (SINGULAIR) 10 MG tablet Take 10 mg by mouth at bedtime.     Omega-3 Fatty Acids (FISH OIL) 1200 MG CAPS Take 1,200 mg by mouth 2 (two) times daily.      omeprazole (PRILOSEC) 40 MG capsule Take 40 mg by mouth 2 (two) times daily.      rOPINIRole (REQUIP) 2 MG tablet Take 2 mg by mouth at bedtime.     sacubitril-valsartan (ENTRESTO) 24-26 MG Take 1 tablet by mouth 2 (two) times daily. 60 tablet 11   tamsulosin (FLOMAX) 0.4 MG CAPS capsule Take 0.4 capsules by mouth 2 (two) times daily.     [place]  300 UNIT/ML Solostar Pen Inject 94 Units into the skin at bedtime.     traMADol (ULTRAM) 50 MG tablet Take 50 mg by mouth daily as needed for moderate pain.      TURMERIC PO Take 1,000 mg by mouth in the morning and at bedtime.     venlafaxine XR (EFFEXOR-XR) 150 MG 24 hr capsule Take 150 mg by mouth in the morning.     Vitamin D, Ergocalciferol, (DRISDOL) 1.25 MG (50000 UT) CAPS capsule Take 50,000 Units by mouth every Tuesday. IN THE MORNING     No current facility-administered medications for this visit.    Allergies:   Prednisone and Victoza [liraglutide]   Social History:  The patient  reports that he has never smoked. He has never used smokeless tobacco. He reports that he does not currently use alcohol. He reports that he does not use drugs.   Family History:  The patient's family history includes Colon cancer in his father; Hyperlipidemia in his father; Hypertension in his father; Liver cancer in his father; Lung cancer in his father.   ROS:  Please see the history of present illness.   Otherwise, review of systems is positive for none.   All other systems are reviewed and negative.   PHYSICAL EXAM: VS:  BP 112/78    Pulse 87    Ht 6\' 2"  (1.88 m)     Wt (!) 308 lb (139.7 kg)    SpO2 95%    BMI 39.54 kg/m  , BMI Body mass index is 39.54 kg/m. GEN: Well nourished, well developed, in no acute distress  HEENT: normal  Neck: no JVD, carotid bruits, or masses Cardiac: Irregular; no murmurs, rubs, or gallops,no edema  Respiratory:  clear to auscultation bilaterally, normal work of breathing GI: soft, nontender, nondistended, + BS MS: no deformity or atrophy  Skin: warm and dry Neuro:  Strength and sensation are intact Psych: euthymic mood, full affect  EKG:  EKG is ordered today. Personal review of the ekg ordered shows atrial fibrillation, rate 87  Recent Labs: 04/12/2021: B Natriuretic Peptide 219.3 04/13/2021: Magnesium 2.2; TSH 1.090 07/10/2021: BUN 15; Creatinine, Ser 1.18; Hemoglobin 14.8; Platelets 187; Potassium 4.2; Sodium 141    Lipid Panel     Component Value Date/Time   CHOL 135 10/09/2019 0820   TRIG 159 (H) 10/09/2019 0820   HDL 47 10/09/2019 0820   CHOLHDL 2.9 10/09/2019 0820   LDLCALC 61 10/09/2019 0820     Wt Readings from Last 3 Encounters:  12/25/21 (!) 308 lb (139.7 kg)  11/17/21 (!) 310 lb 12.8 oz (141 kg)  11/06/21 (!) 315 lb 3.2 oz (143 kg)      Other studies Reviewed: Additional studies/ records that were reviewed today include: TTE 03/11/2020 Review of the above records today demonstrates:   1. Left ventricular ejection fraction, by estimation, is 60 to 65%. The  left ventricle has normal function. The left ventricle has no regional  wall motion abnormalities. Left ventricular diastolic parameters are  consistent with Grade I diastolic  dysfunction (impaired relaxation).   ASSESSMENT AND PLAN:  1.  Persistent atrial fibrillation/flutter: Status post ablation 12/25/2019 with repeat ablation 07/19/2021.  Currently on Eliquis.  CHA2DS2-VASc of 3.  His most recent ablation was for an atypical atrial flutter.  Unfortunately he is back in atrial fibrillation today.  During his second ablation, his  pulmonary veins were isolated.  I feel that he needs an antiarrhythmic.  He does not have coronary artery disease  and his ejection fraction is normal.  We Sean [name] start him on flecainide 100 mg twice daily and plan for cardioversion.  2.  Chronic systolic heart failure due to nonischemic cardiomyopathy: Ejection fraction 35 to 40%.  Currently on metoprolol and Entresto.  Ejection fraction has since normalized.  No obvious volume overload.  3.  Hypertension: Currently well controlled  Current medicines are reviewed at length with the patient today.   The patient does not have concerns regarding his medicines.  The following changes were made today: None  Labs/ tests ordered today include:  Orders Placed This Encounter  Procedures   EKG 12-Lead      Disposition:   FU with Viann Nielson 3 months  Signed, Worley Radermacher Meredith Leeds, MD  12/25/2021 1:53 PM     [place] [address] [place] [phone] (office) [phone] (fax)

## 2022-01-01 ENCOUNTER — Other Ambulatory Visit: Payer: Self-pay | Admitting: *Deleted

## 2022-01-01 DIAGNOSIS — I480 Paroxysmal atrial fibrillation: Secondary | ICD-10-CM

## 2022-01-01 MED ORDER — APIXABAN 5 MG PO TABS: 5.0000 mg | ORAL_TABLET | Freq: Two times a day (BID) | ORAL | 1 refills | Status: DC

## 2022-01-01 NOTE — Telephone Encounter (Signed)
Eliquis '5mg'$  paper refill request received. Patient is 71 years old, weight-139.7kg, Crea-1.18 on 07/10/2021, Diagnosis-Afib, and last seen by Dr. Curt Bears on 12/25/2021. Dose is appropriate based on dosing criteria. Will send in refill to requested pharmacy.   ?

## 2022-01-02 ENCOUNTER — Encounter (HOSPITAL_COMMUNITY): Payer: Self-pay | Admitting: Cardiology

## 2022-01-08 ENCOUNTER — Telehealth: Payer: Self-pay | Admitting: Cardiology

## 2022-01-08 NOTE — Telephone Encounter (Signed)
Patient c/o Palpitations:  High priority if patient c/o lightheadedness, shortness of breath, or chest pain ? ?How long have you had palpitations/irregular HR/ Afib? Are you having the symptoms now? Today; Yes ? ?Are you currently experiencing lightheadedness, SOB or CP? No  ? ?Do you have a history of afib (atrial fibrillation) or irregular heart rhythm? Yes  ? ?Have you checked your BP or HR? (document readings if available): no  ? ?Are you experiencing any other symptoms? No   ?

## 2022-01-08 NOTE — Telephone Encounter (Signed)
Pt states that he has a cardioversion scheduled on Thursday and his Apple watch said he is in [place]. Pt states that he feels like he is in rhythm. I have scheduled the pt for a nurse visit and EKG on 3/14. ?

## 2022-01-09 ENCOUNTER — Ambulatory Visit (INDEPENDENT_AMBULATORY_CARE_PROVIDER_SITE_OTHER): Payer: Medicare Other

## 2022-01-09 ENCOUNTER — Other Ambulatory Visit: Payer: Self-pay

## 2022-01-09 ENCOUNTER — Telehealth: Payer: Self-pay | Admitting: Cardiology

## 2022-01-09 ENCOUNTER — Encounter: Payer: Self-pay | Admitting: Cardiology

## 2022-01-09 VITALS — BP 150/90 | HR 53 | Ht 74.0 in | Wt 312.0 lb

## 2022-01-09 DIAGNOSIS — I480 Paroxysmal atrial fibrillation: Secondary | ICD-10-CM | POA: Diagnosis not present

## 2022-01-09 NOTE — Progress Notes (Signed)
? ?  Nurse Visit  ?  ?Date of Encounter: 01/09/2022 ?ID: Sean [name]., DOB 11/06/1950, MRN [id number] ? ?PCP:  [name], MD ?  ?Cardiologist:  Will Meredith Leeds, MD [name] ?Advanced Practice Provider:  No care team member to display ?Electrophysiologist:  Will Meredith Leeds, MD  ?  Cardioversion has been cancelled per Dr. Curt Bears. Pt is aware. ? ? ?Visit Details  ? ?VS:  BP (!) 150/90 (BP Location: Right Arm, Patient Position: Sitting, Cuff Size: Normal)   Pulse (!) 53   Ht '6\' 2"'$  (1.88 m)   Wt (!) 312 lb (141.5 kg)   SpO2 95%   BMI 40.06 kg/m?  , BMI Body mass index is 40.06 kg/m?. ? ?Wt Readings from Last 3 Encounters:  ?01/09/22 (!) 312 lb (141.5 kg)  ?12/25/21 (!) 308 lb (139.7 kg)  ?11/17/21 (!) 310 lb 12.8 oz (141 kg)  ?  ? ?Reason for visit: EKG ?Performed today:   , Vitals, EKG, Provider consulted:[name] and [place], and Education ?Changes (medications, testing, etc.) : Cardioversion cancelled ?Length of Visit: 5 minutes ? ?Medications Adjustments/Labs and Tests Ordered: ?Orders Placed This Encounter  ?Procedures  ? EKG 12-Lead  ? ?No orders of the defined types were placed in this encounter. ? ? ?Signed, ?Truddie Hidden, RN  ?01/09/2022 10:02 AM ? ? ?  ?

## 2022-01-09 NOTE — Telephone Encounter (Signed)
Found EKG scanned into patient's chart from [facility] office. Showed DOD, Dr. Harrington Challenger to confirm NSR. She agreed. Will cancel cardioversion and send a message to Dr. Curt Bears to let him know. ?

## 2022-01-09 NOTE — Telephone Encounter (Signed)
Patient called and wants to talk with Dr. Curt Bears or nurse in regards to patients EKG that was sent over from [facility] location and also about the cardioversion that he is having tomorrow. Please call back to discuss ?

## 2022-01-10 ENCOUNTER — Ambulatory Visit (HOSPITAL_COMMUNITY): Admission: RE | Admit: 2022-01-10 | Payer: Medicare Other | Source: Home / Self Care | Admitting: Cardiology

## 2022-01-10 DIAGNOSIS — I480 Paroxysmal atrial fibrillation: Secondary | ICD-10-CM

## 2022-01-10 SURGERY — CARDIOVERSION
Anesthesia: General

## 2022-01-17 DIAGNOSIS — E559 Vitamin D deficiency, unspecified: Secondary | ICD-10-CM | POA: Diagnosis not present

## 2022-01-17 DIAGNOSIS — I4891 Unspecified atrial fibrillation: Secondary | ICD-10-CM | POA: Diagnosis not present

## 2022-01-17 DIAGNOSIS — I10 Essential (primary) hypertension: Secondary | ICD-10-CM | POA: Diagnosis not present

## 2022-01-17 DIAGNOSIS — E1121 Type 2 diabetes mellitus with diabetic nephropathy: Secondary | ICD-10-CM | POA: Diagnosis not present

## 2022-01-17 DIAGNOSIS — Z6841 Body Mass Index (BMI) 40.0 and over, adult: Secondary | ICD-10-CM | POA: Diagnosis not present

## 2022-01-17 DIAGNOSIS — G4700 Insomnia, unspecified: Secondary | ICD-10-CM | POA: Diagnosis not present

## 2022-01-18 DIAGNOSIS — E559 Vitamin D deficiency, unspecified: Secondary | ICD-10-CM | POA: Diagnosis not present

## 2022-01-18 DIAGNOSIS — Z79899 Other long term (current) drug therapy: Secondary | ICD-10-CM | POA: Diagnosis not present

## 2022-01-18 DIAGNOSIS — E1121 Type 2 diabetes mellitus with diabetic nephropathy: Secondary | ICD-10-CM | POA: Diagnosis not present

## 2022-01-24 DIAGNOSIS — E1121 Type 2 diabetes mellitus with diabetic nephropathy: Secondary | ICD-10-CM | POA: Diagnosis not present

## 2022-01-24 DIAGNOSIS — Z6841 Body Mass Index (BMI) 40.0 and over, adult: Secondary | ICD-10-CM | POA: Diagnosis not present

## 2022-01-24 DIAGNOSIS — I10 Essential (primary) hypertension: Secondary | ICD-10-CM | POA: Diagnosis not present

## 2022-01-25 DIAGNOSIS — E119 Type 2 diabetes mellitus without complications: Secondary | ICD-10-CM | POA: Diagnosis not present

## 2022-01-25 DIAGNOSIS — H52223 Regular astigmatism, bilateral: Secondary | ICD-10-CM | POA: Diagnosis not present

## 2022-01-25 DIAGNOSIS — Z961 Presence of intraocular lens: Secondary | ICD-10-CM | POA: Diagnosis not present

## 2022-01-25 DIAGNOSIS — H524 Presbyopia: Secondary | ICD-10-CM | POA: Diagnosis not present

## 2022-01-25 DIAGNOSIS — H26493 Other secondary cataract, bilateral: Secondary | ICD-10-CM | POA: Diagnosis not present

## 2022-01-25 DIAGNOSIS — Z7984 Long term (current) use of oral hypoglycemic drugs: Secondary | ICD-10-CM | POA: Diagnosis not present

## 2022-01-25 DIAGNOSIS — I10 Essential (primary) hypertension: Secondary | ICD-10-CM | POA: Diagnosis not present

## 2022-01-25 DIAGNOSIS — H5203 Hypermetropia, bilateral: Secondary | ICD-10-CM | POA: Diagnosis not present

## 2022-02-03 NOTE — Progress Notes (Signed)
? ?Electrophysiology Office Note ? ? ?Date:  02/05/2022  ? ?ID:  Sean [name]., DOB [birth year], MRN [id number] ? ?PCP:  Sean Johns, [name]  ?Cardiologist:  Sean [name] ?Primary Electrophysiologist:  Sean Fullen Meredith Leeds, [name]   ? ?Chief Complaint: AF ?  ?History of Present Illness: ?Sean [name]. is a 71 y.o. male who is being seen today for the evaluation of AF at the request of Sean Johns, [name]. Presenting today for electrophysiology evaluation. ? ?He has a history significant for hypertension, hyperlipidemia, CHF, atrial fibrillation.  He had attempted cardioversion 11/05/2019 but did not go back into normal rhythm.  He continued to have episodes of fatigue with an ejection fraction of 35 to 40%.  He is status post atrial fibrillation ablation 11/24/2019.  Fortunately his ejection fraction normalized after ablation.  He went into atypical atrial flutter and was status post ablation 07/19/2021.  He presented to cardiology clinic in atrial fibrillation was started on flecainide. ? ?Today, denies symptoms of palpitations, chest pain, shortness of breath, orthopnea, PND, lower extremity edema, claudication, dizziness, presyncope, syncope, bleeding, or neurologic sequela. The patient is tolerating medications without difficulties.  Since his cardioversion he has done well.  He has had no chest pain or shortness of breath.  Is able to do all of his daily activities.  He feels comfortable with how he has been feeling and does not wish for any further medication changes. ? ? ?Past Medical History:  ?Diagnosis Date  ? Allergic rhinitis   ? Arrhythmia   ? Atrial fibrillation ([place])   ? Benign prostatic hyperplasia with urinary obstruction 11/29/2018  ? CHF (congestive heart failure) ([place])   ? Diabetes mellitus due to underlying condition with unspecified complications ([place]) 43/12/2949  ? Diabetes mellitus without complication ([place])   ? DOE (dyspnea on exertion) 10/29/2019  ? S/p PE  11/2018 > DOAC recurrent 09/14/2019 off DOAC so  resumed  - onset variable doe and orthostatic lightheadedness 06/2019 with afib - PFT's Oval Linsey  10/08/2019 :  FEV1 2.79 (73%) with ratio 75 and 6 % better p saba,  Air trapping on lung vol,  ERV 14% and dlco 22.78 (60%) corrects to 3.68 (76%) for vol with minimal concavity to f/v loop  - 10/29/2019   Walked RA x two laps =  approx 538f @ avg  ? Essential hypertension 09/29/2019  ? Fissure in skin of foot 12/11/2016  ? GERD (gastroesophageal reflux disease) 11/29/2018  ? Hemospermia 02/22/2020  ? History of pulmonary embolism 09/29/2019  ? Hyperlipidemia   ? Hypertension   ? Hypogonadism in male 02/22/2020  ? IBS (irritable bowel syndrome) 11/29/2018  ? Impotence 02/22/2020  ? Incomplete emptying of bladder 02/22/2020  ? Mixed dyslipidemia 09/29/2019  ? Morbid obesity ([place] 09/29/2019  ? OSA on CPAP   ? Overgrown toenails 12/11/2016  ? Persistent atrial fibrillation ([place] 09/29/2019  ? Plantar fasciitis, bilateral 06/05/2016  ? Secondary hypercoagulable state ([place] 01/22/2020  ? Upper airway cough syndrome 12/15/2019  ? Noted on exam 12/15/2019 onset while on Entresto, resolved with purse lip  ? ?Past Surgical History:  ?Procedure Laterality Date  ? ATRIAL FIBRILLATION ABLATION N/A 12/25/2019  ? Procedure: ATRIAL FIBRILLATION ABLATION;  Surgeon: Sean [name];  [facility];  Service: Cardiovascular;  Laterality: N/A;  ? ATRIAL FIBRILLATION ABLATION N/A 07/19/2021  ? Procedure: ATRIAL FIBRILLATION ABLATION;  Surgeon: Sean [name];  [facility];  Service: Cardiovascular;  Laterality: N/A;  ? BIOPSY  08/11/2020  ?  Procedure: BIOPSY;  Surgeon: Sean [name];  [facility];  Service: Endoscopy;;  ? CARDIAC CATHETERIZATION    ? CARDIOVERSION N/A 04/17/2021  ? Procedure: CARDIOVERSION;  Surgeon: Sean [name];  Location: [facility];  Service: Cardiovascular;  Laterality: N/A;  ? ESOPHAGOGASTRODUODENOSCOPY (EGD) WITH PROPOFOL N/A 08/11/2020  ? Procedure:  ESOPHAGOGASTRODUODENOSCOPY (EGD) WITH PROPOFOL;  Surgeon: Sean [name];  [facility];  Service: Endoscopy;  Laterality: N/A;  ? KNEE SURGERY    ? PILONIDAL CYST / SINUS EXCISION    ? UPPER ESOPHAGEAL ENDOSCOPIC ULTRASOUND (EUS) N/A 08/11/2020  ? Procedure: UPPER ESOPHAGEAL ENDOSCOPIC ULTRASOUND (EUS);  Surgeon: Sean [name];  Location: [facility];  Service: Endoscopy;  Laterality: N/A;  ? ? ? ?Current Outpatient Medications  ?Medication Sig Dispense Refill  ? albuterol (PROVENTIL) (2.5 MG/3ML) 0.083% nebulizer solution Take 2.5 mg by nebulization every 6 (six) hours as needed for wheezing or shortness of breath.     ? albuterol (VENTOLIN HFA) 108 (90 Base) MCG/ACT inhaler Inhale 2 puffs into the lungs every 6 (six) hours as needed for wheezing or shortness of breath.     ? allopurinol (ZYLOPRIM) 100 MG tablet Take 100 mg by mouth in the morning.    ? apixaban (ELIQUIS) 5 MG TABS tablet Take 1 tablet (5 mg total) by mouth 2 (two) times daily. 180 tablet 1  ? Ascorbic Acid (VITAMIN C) 1000 MG tablet Take 1,000 mg by mouth 2 (two) times daily.    ? atorvastatin (LIPITOR) 20 MG tablet Take 20 mg by mouth daily.    ? Cholecalciferol (VITAMIN D3) 50 MCG (2000 UT) TABS Take 2,000 Units by mouth 2 (two) times daily.     ? empagliflozin (JARDIANCE) 25 MG TABS tablet Take 25 mg by mouth in the morning.    ? Eszopiclone 3 MG TABS Take 3 mg by mouth at bedtime.    ? flecainide (TAMBOCOR) 100 MG tablet Take 1 tablet (100 mg total) by mouth 2 (two) times daily. 60 tablet 3  ? fluticasone (FLONASE) 50 MCG/ACT nasal spray Place 2 sprays into both nostrils daily as needed for allergies or rhinitis.    ? furosemide (LASIX) 40 MG tablet Take 40 mg by mouth daily as needed for fluid.    ? Glucosamine-Chondroitin (COSAMIN DS PO) Take 1 tablet by mouth in the morning and at bedtime.    ? insulin lispro (HUMALOG) 100 UNIT/ML injection Inject 0-10 Units into the skin 3 (three) times daily as needed for high  blood sugar. Sliding Scale    ? metoprolol succinate (TOPROL-XL) 100 MG 24 hr tablet Take 100 mg by mouth at bedtime. Patient taking 50 mg in am and 50 mg in PM per PCP    ? montelukast (SINGULAIR) 10 MG tablet Take 10 mg by mouth at bedtime.    ? Omega-3 Fatty Acids (FISH OIL) 1200 MG CAPS Take 1,200 mg by mouth 2 (two) times daily.     ? omeprazole (PRILOSEC) 40 MG capsule Take 40 mg by mouth 2 (two) times daily.     ? rOPINIRole (REQUIP) 2 MG tablet Take 2 mg by mouth at bedtime.    ? sacubitril-valsartan (ENTRESTO) 49-51 MG Take 1.5 tablets by mouth 2 (two) times daily.    ? tamsulosin (FLOMAX) 0.4 MG CAPS capsule Take 0.4 capsules by mouth 2 (two) times daily.    ? TOUJEO MAX SOLOSTAR 300 UNIT/ML Solostar Pen Inject 98 Units into the skin at bedtime.    ?  TURMERIC PO Take 1,000 mg by mouth in the morning and at bedtime.    ? venlafaxine XR (EFFEXOR-XR) 150 MG 24 hr capsule Take 150 mg by mouth in the morning.    ? Vitamin D, Ergocalciferol, (DRISDOL) 1.25 MG (50000 UT) CAPS capsule Take 50,000 Units by mouth every Tuesday. IN THE MORNING    ? gabapentin (NEURONTIN) 300 MG capsule Take 300 mg by mouth at bedtime. (Patient not taking: Reported on 02/05/2022)    ? metoprolol succinate (TOPROL-XL) 50 MG 24 hr tablet Take 1 tablet (50 mg total) by mouth daily. Take with or immediately following a meal. (Patient taking differently: Take 50 mg by mouth 2 (two) times daily. Take with or immediately following a meal. Per PCP for total of '100mg'$  daily) 90 tablet 2  ? traMADol (ULTRAM) 50 MG tablet Take 50 mg by mouth daily as needed for moderate pain.  (Patient not taking: Reported on 02/05/2022)    ? ?No current facility-administered medications for this visit.  ? ? ?Allergies:   Prednisone and Victoza [liraglutide]  ? ?Social History:  The patient  reports that he has never smoked. He has never used smokeless tobacco. He reports that he does not currently use alcohol. He reports that he does not use drugs.  ? ?Family  History:  The patient's family history includes Colon cancer in his father; Hyperlipidemia in his father; Hypertension in his father; Liver cancer in his father; Lung cancer in his father.  ? ?ROS:  Please see the history of

## 2022-02-05 ENCOUNTER — Encounter: Payer: Self-pay | Admitting: Cardiology

## 2022-02-05 ENCOUNTER — Ambulatory Visit (INDEPENDENT_AMBULATORY_CARE_PROVIDER_SITE_OTHER): Payer: Medicare Other | Admitting: Cardiology

## 2022-02-05 VITALS — BP 130/80 | Ht 74.0 in | Wt 313.8 lb

## 2022-02-05 DIAGNOSIS — I4819 Other persistent atrial fibrillation: Secondary | ICD-10-CM

## 2022-02-05 NOTE — Patient Instructions (Signed)
Medication Instructions:  ?Your physician recommends that you continue on your current medications as directed. Please refer to the Current Medication list given to you today. ? ?*If you need a refill on your cardiac medications before your next appointment, please call your pharmacy* ? ? ?Lab Work: ?None ordered. ? ?If you have labs (blood work) drawn today and your tests are completely normal, you will receive your results only by: ?MyChart Message (if you have MyChart) OR ?A paper copy in the mail ?If you have any lab test that is abnormal or we need to change your treatment, we will call you to review the results. ? ? ?Testing/Procedures: ?None ordered. ? ? ? ?Follow-Up: ?At [facility], you and your health needs are our priority.  As part of our continuing mission to provide you with exceptional heart care, we have created designated Provider Care Teams.  These Care Teams include your primary Cardiologist (physician) and Advanced Practice Providers (APPs -  Physician Assistants and Nurse Practitioners) who all work together to provide you with the care you need, when you need it. ? ?We recommend signing up for the patient portal called "MyChart".  Sign up information is provided on this After Visit Summary.  MyChart is used to connect with patients for Virtual Visits (Telemedicine).  Patients are able to view lab/test results, encounter notes, upcoming appointments, etc.  Non-urgent messages can be sent to your provider as well.   ?To learn more about what you can do with MyChart, go to [email or web address].   ? ?Your next appointment:   ?6 month(s) ? ?The format for your next appointment:   ?In Person ? ?Provider:   ?Allegra Lai, MD{ ? ? ?Important Information About Sugar ? ? ? ? ?  ?

## 2022-02-21 DIAGNOSIS — Z6841 Body Mass Index (BMI) 40.0 and over, adult: Secondary | ICD-10-CM | POA: Diagnosis not present

## 2022-02-21 DIAGNOSIS — G4700 Insomnia, unspecified: Secondary | ICD-10-CM | POA: Diagnosis not present

## 2022-02-21 DIAGNOSIS — I10 Essential (primary) hypertension: Secondary | ICD-10-CM | POA: Diagnosis not present

## 2022-02-21 DIAGNOSIS — N400 Enlarged prostate without lower urinary tract symptoms: Secondary | ICD-10-CM | POA: Diagnosis not present

## 2022-03-12 DIAGNOSIS — S93401A Sprain of unspecified ligament of right ankle, initial encounter: Secondary | ICD-10-CM | POA: Diagnosis not present

## 2022-03-12 DIAGNOSIS — M1711 Unilateral primary osteoarthritis, right knee: Secondary | ICD-10-CM | POA: Diagnosis not present

## 2022-03-20 DIAGNOSIS — Z0000 Encounter for general adult medical examination without abnormal findings: Secondary | ICD-10-CM | POA: Diagnosis not present

## 2022-03-20 DIAGNOSIS — Z1331 Encounter for screening for depression: Secondary | ICD-10-CM | POA: Diagnosis not present

## 2022-03-20 DIAGNOSIS — Z9181 History of falling: Secondary | ICD-10-CM | POA: Diagnosis not present

## 2022-03-20 DIAGNOSIS — Z6841 Body Mass Index (BMI) 40.0 and over, adult: Secondary | ICD-10-CM | POA: Diagnosis not present

## 2022-03-20 DIAGNOSIS — E669 Obesity, unspecified: Secondary | ICD-10-CM | POA: Diagnosis not present

## 2022-03-20 DIAGNOSIS — E785 Hyperlipidemia, unspecified: Secondary | ICD-10-CM | POA: Diagnosis not present

## 2022-03-21 DIAGNOSIS — G4700 Insomnia, unspecified: Secondary | ICD-10-CM | POA: Diagnosis not present

## 2022-03-21 DIAGNOSIS — K219 Gastro-esophageal reflux disease without esophagitis: Secondary | ICD-10-CM | POA: Diagnosis not present

## 2022-03-21 DIAGNOSIS — Z9181 History of falling: Secondary | ICD-10-CM | POA: Diagnosis not present

## 2022-03-21 DIAGNOSIS — G2581 Restless legs syndrome: Secondary | ICD-10-CM | POA: Diagnosis not present

## 2022-03-21 DIAGNOSIS — N401 Enlarged prostate with lower urinary tract symptoms: Secondary | ICD-10-CM | POA: Diagnosis not present

## 2022-04-02 ENCOUNTER — Ambulatory Visit: Payer: Medicare Other | Admitting: Cardiology

## 2022-04-06 ENCOUNTER — Other Ambulatory Visit: Payer: Self-pay

## 2022-04-06 MED ORDER — FLECAINIDE ACETATE 100 MG PO TABS: 100.0000 mg | ORAL_TABLET | Freq: Two times a day (BID) | ORAL | 10 refills | Status: DC

## 2022-04-18 DIAGNOSIS — E559 Vitamin D deficiency, unspecified: Secondary | ICD-10-CM | POA: Diagnosis not present

## 2022-04-18 DIAGNOSIS — I4891 Unspecified atrial fibrillation: Secondary | ICD-10-CM | POA: Diagnosis not present

## 2022-04-18 DIAGNOSIS — Z6841 Body Mass Index (BMI) 40.0 and over, adult: Secondary | ICD-10-CM | POA: Diagnosis not present

## 2022-04-18 DIAGNOSIS — M159 Polyosteoarthritis, unspecified: Secondary | ICD-10-CM | POA: Diagnosis not present

## 2022-04-18 DIAGNOSIS — E1121 Type 2 diabetes mellitus with diabetic nephropathy: Secondary | ICD-10-CM | POA: Diagnosis not present

## 2022-04-18 DIAGNOSIS — J309 Allergic rhinitis, unspecified: Secondary | ICD-10-CM | POA: Diagnosis not present

## 2022-04-18 DIAGNOSIS — G4700 Insomnia, unspecified: Secondary | ICD-10-CM | POA: Diagnosis not present

## 2022-04-25 DIAGNOSIS — N401 Enlarged prostate with lower urinary tract symptoms: Secondary | ICD-10-CM | POA: Diagnosis not present

## 2022-04-25 DIAGNOSIS — Z125 Encounter for screening for malignant neoplasm of prostate: Secondary | ICD-10-CM | POA: Diagnosis not present

## 2022-04-25 DIAGNOSIS — E1121 Type 2 diabetes mellitus with diabetic nephropathy: Secondary | ICD-10-CM | POA: Diagnosis not present

## 2022-06-05 ENCOUNTER — Encounter: Payer: Self-pay | Admitting: Internal Medicine

## 2022-06-14 ENCOUNTER — Other Ambulatory Visit: Payer: Self-pay | Admitting: Cardiology

## 2022-06-14 DIAGNOSIS — I480 Paroxysmal atrial fibrillation: Secondary | ICD-10-CM

## 2022-06-14 NOTE — Telephone Encounter (Signed)
Prescription refill request for Eliquis received. Indication:Afib Last office visit:4/23 Scr:1.1 Age: 71 Weight:142.3 kg  Prescription refilled

## 2022-06-18 ENCOUNTER — Telehealth: Payer: Self-pay

## 2022-06-18 ENCOUNTER — Other Ambulatory Visit: Payer: Self-pay

## 2022-06-18 ENCOUNTER — Telehealth: Payer: Self-pay | Admitting: Internal Medicine

## 2022-06-18 DIAGNOSIS — G4733 Obstructive sleep apnea (adult) (pediatric): Secondary | ICD-10-CM

## 2022-06-18 NOTE — Telephone Encounter (Signed)
The current CPAP download shows that Mr Sean [name] is using his CPAP every night with very good control. His machine is set on fixed pressure of 16. Please order:           1) change setting to autopap 5-15. This will allow lower airflow through the humidifier.           2) Ask DME to reach out to [name] to discuss humidifier settings and comfort issues.

## 2022-06-18 NOTE — Telephone Encounter (Signed)
The current download shows that Sean [name] is using his macchine and meeting goals 100% of nights, with e

## 2022-06-18 NOTE — Telephone Encounter (Signed)
This is being addressed in mychart! Closing encounter.

## 2022-06-20 DIAGNOSIS — D649 Anemia, unspecified: Secondary | ICD-10-CM | POA: Diagnosis not present

## 2022-06-20 DIAGNOSIS — G4700 Insomnia, unspecified: Secondary | ICD-10-CM | POA: Diagnosis not present

## 2022-06-20 DIAGNOSIS — E785 Hyperlipidemia, unspecified: Secondary | ICD-10-CM | POA: Diagnosis not present

## 2022-06-20 DIAGNOSIS — G4730 Sleep apnea, unspecified: Secondary | ICD-10-CM | POA: Diagnosis not present

## 2022-06-20 DIAGNOSIS — E538 Deficiency of other specified B group vitamins: Secondary | ICD-10-CM | POA: Diagnosis not present

## 2022-06-21 NOTE — Telephone Encounter (Signed)
Md. Please advies. Pt states he cant breathe

## 2022-06-21 NOTE — Telephone Encounter (Signed)
Called [employer] and relayed the message from pt and was told by [employer] that they would reach out to pt.

## 2022-06-22 DIAGNOSIS — E559 Vitamin D deficiency, unspecified: Secondary | ICD-10-CM | POA: Diagnosis not present

## 2022-06-22 DIAGNOSIS — Z79899 Other long term (current) drug therapy: Secondary | ICD-10-CM | POA: Diagnosis not present

## 2022-06-22 DIAGNOSIS — E785 Hyperlipidemia, unspecified: Secondary | ICD-10-CM | POA: Diagnosis not present

## 2022-06-22 DIAGNOSIS — E538 Deficiency of other specified B group vitamins: Secondary | ICD-10-CM | POA: Diagnosis not present

## 2022-07-11 ENCOUNTER — Telehealth: Payer: Self-pay | Admitting: *Deleted

## 2022-07-11 NOTE — Patient Outreach (Addendum)
  Care Coordination   Initial Visit Note   07/11/2022 Name: Sean [name]. MRN: [id number] DOB: [birth year]  Sean [name]. is a 71 y.o. year old male who sees [name], MD for primary care. I spoke with  [name] by phone today.  What matters to the patients health and wellness today?  Care Coordination intake scheduled- pt currently has COVID per report but is interested in [facility] and SW support for medical management, social/activities to include swimming, etc.    Goals Addressed             This Visit's Progress    Care Coordination Activated- scheduled for intake       Care Coordination Interventions:  Active listening / Reflection utilized  Discussed caregiver resources and support:   Discussed Care Coordination program and pt would like to consider/participate- agrees to follow up call next week for intake          SDOH assessments and interventions completed:  Yes  SDOH Interventions Today    Flowsheet Row Most Recent Value  SDOH Interventions   Food Insecurity Interventions Intervention Not Indicated  Housing Interventions Intervention Not Indicated  Transportation Interventions Intervention Not Indicated  Utilities Interventions Intervention Not Indicated  Financial Strain Interventions Intervention Not Indicated        Care Coordination Interventions Activated:  Yes  Care Coordination Interventions:  Yes, provided   Follow up plan: Follow up call scheduled for 07/17/22    Encounter Outcome:  Pt. Visit Completed   Eduard Clos MSW, LCSW Licensed Clinical Social Worker      [phone]

## 2022-07-17 ENCOUNTER — Ambulatory Visit: Payer: Self-pay | Admitting: *Deleted

## 2022-07-17 NOTE — Patient Instructions (Signed)
Visit Information  Thank you for taking time to visit with me today. Please don't hesitate to contact me if I can be of assistance to you.   Following are the goals we discussed today:   Goals Addressed             This Visit's Progress    COMPLETED: Care Coordination Activated- scheduled for intake       Care Coordination Interventions:  Active listening / Reflection utilized  Discussed caregiver resources and support:   Discussed Care Coordination program and pt would like to consider/participate- agrees to follow up call next week for intake           If you are experiencing a Mental Health or [place] or need someone to talk to, please call 911   The patient verbalized understanding of instructions, educational materials, and care plan provided today and DECLINED offer to receive copy of patient instructions, educational materials, and care plan.   No further follow up required:    Eduard Clos MSW, LCSW Licensed Clinical Social Worker      [phone]

## 2022-07-17 NOTE — Patient Outreach (Signed)
  Care Coordination   Follow Up Visit Note   07/17/2022 Name: Sean [name]. MRN: [id number] DOB: 29-Mar-1951  Sean [name]. is a 71 y.o. year old male who sees Sean Johns, MD for primary care. I spoke with  Sean [name]. by phone today.  What matters to the patients health and wellness today?  Pt reports receiving resource info emailed to him. He feels at this time his wife having procedure/surgery and needing to prioritize those needs now. CSW validated this and encouraged him to reach out if needs arise we can assist with in the future.     Goals Addressed             This Visit's Progress    COMPLETED: Care Coordination Activated- scheduled for intake       Care Coordination Interventions:  Active listening / Reflection utilized  Discussed caregiver resources and support:   Discussed Care Coordination program and pt would like to consider/participate- agrees to follow up call next week for intake          SDOH assessments and interventions completed:  Yes     Care Coordination Interventions Activated:  Yes  Care Coordination Interventions:  Yes, provided   Follow up plan: No further intervention required.   Encounter Outcome:  Pt. Visit Completed   Eduard Clos MSW, LCSW Licensed Clinical Social Worker      [phone]

## 2022-07-20 DIAGNOSIS — K219 Gastro-esophageal reflux disease without esophagitis: Secondary | ICD-10-CM | POA: Diagnosis not present

## 2022-07-20 DIAGNOSIS — N401 Enlarged prostate with lower urinary tract symptoms: Secondary | ICD-10-CM | POA: Diagnosis not present

## 2022-07-20 DIAGNOSIS — G4700 Insomnia, unspecified: Secondary | ICD-10-CM | POA: Diagnosis not present

## 2022-07-20 DIAGNOSIS — G2581 Restless legs syndrome: Secondary | ICD-10-CM | POA: Diagnosis not present

## 2022-07-20 DIAGNOSIS — U099 Post covid-19 condition, unspecified: Secondary | ICD-10-CM | POA: Diagnosis not present

## 2022-07-20 DIAGNOSIS — G9332 Myalgic encephalomyelitis/chronic fatigue syndrome: Secondary | ICD-10-CM | POA: Diagnosis not present

## 2022-08-07 ENCOUNTER — Other Ambulatory Visit: Payer: Self-pay

## 2022-08-07 ENCOUNTER — Emergency Department (HOSPITAL_BASED_OUTPATIENT_CLINIC_OR_DEPARTMENT_OTHER): Payer: Medicare Other | Admitting: Certified Registered Nurse Anesthetist

## 2022-08-07 ENCOUNTER — Emergency Department (HOSPITAL_COMMUNITY): Payer: Medicare Other | Admitting: Certified Registered Nurse Anesthetist

## 2022-08-07 ENCOUNTER — Encounter (HOSPITAL_COMMUNITY): Payer: Self-pay | Admitting: Emergency Medicine

## 2022-08-07 ENCOUNTER — Emergency Department (HOSPITAL_COMMUNITY): Payer: Medicare Other

## 2022-08-07 ENCOUNTER — Ambulatory Visit (HOSPITAL_COMMUNITY)
Admission: EM | Admit: 2022-08-07 | Discharge: 2022-08-07 | Disposition: A | Discharge status: Home / Self Care | Payer: Medicare Other | Attending: Emergency Medicine | Admitting: Emergency Medicine

## 2022-08-07 ENCOUNTER — Encounter (HOSPITAL_COMMUNITY)
Admission: EM | Disposition: A | Discharge status: Home / Self Care | Payer: Self-pay | Source: Home / Self Care | Attending: Emergency Medicine

## 2022-08-07 DIAGNOSIS — S92352B Displaced fracture of fifth metatarsal bone, left foot, initial encounter for open fracture: Secondary | ICD-10-CM | POA: Diagnosis present

## 2022-08-07 DIAGNOSIS — I10 Essential (primary) hypertension: Secondary | ICD-10-CM | POA: Diagnosis not present

## 2022-08-07 DIAGNOSIS — G4730 Sleep apnea, unspecified: Secondary | ICD-10-CM

## 2022-08-07 DIAGNOSIS — E119 Type 2 diabetes mellitus without complications: Secondary | ICD-10-CM | POA: Insufficient documentation

## 2022-08-07 DIAGNOSIS — Z23 Encounter for immunization: Secondary | ICD-10-CM | POA: Diagnosis not present

## 2022-08-07 DIAGNOSIS — S68127A Partial traumatic metacarpophalangeal amputation of left little finger, initial encounter: Secondary | ICD-10-CM | POA: Diagnosis not present

## 2022-08-07 DIAGNOSIS — W3400XA Accidental discharge from unspecified firearms or gun, initial encounter: Secondary | ICD-10-CM | POA: Insufficient documentation

## 2022-08-07 DIAGNOSIS — G4733 Obstructive sleep apnea (adult) (pediatric): Secondary | ICD-10-CM | POA: Insufficient documentation

## 2022-08-07 DIAGNOSIS — S61432A Puncture wound without foreign body of left hand, initial encounter: Secondary | ICD-10-CM | POA: Diagnosis not present

## 2022-08-07 DIAGNOSIS — I4891 Unspecified atrial fibrillation: Secondary | ICD-10-CM | POA: Insufficient documentation

## 2022-08-07 DIAGNOSIS — S61239A Puncture wound without foreign body of unspecified finger without damage to nail, initial encounter: Secondary | ICD-10-CM

## 2022-08-07 DIAGNOSIS — I110 Hypertensive heart disease with heart failure: Secondary | ICD-10-CM

## 2022-08-07 DIAGNOSIS — S61237A Puncture wound without foreign body of left little finger without damage to nail, initial encounter: Secondary | ICD-10-CM | POA: Diagnosis not present

## 2022-08-07 DIAGNOSIS — Y9389 Activity, other specified: Secondary | ICD-10-CM | POA: Diagnosis not present

## 2022-08-07 DIAGNOSIS — I509 Heart failure, unspecified: Secondary | ICD-10-CM | POA: Diagnosis not present

## 2022-08-07 DIAGNOSIS — S92352A Displaced fracture of fifth metatarsal bone, left foot, initial encounter for closed fracture: Secondary | ICD-10-CM | POA: Diagnosis not present

## 2022-08-07 DIAGNOSIS — Z794 Long term (current) use of insulin: Secondary | ICD-10-CM | POA: Diagnosis not present

## 2022-08-07 DIAGNOSIS — Z6839 Body mass index (BMI) 39.0-39.9, adult: Secondary | ICD-10-CM | POA: Insufficient documentation

## 2022-08-07 DIAGNOSIS — Y249XXA Unspecified firearm discharge, undetermined intent, initial encounter: Secondary | ICD-10-CM

## 2022-08-07 DIAGNOSIS — K219 Gastro-esophageal reflux disease without esophagitis: Secondary | ICD-10-CM | POA: Diagnosis not present

## 2022-08-07 DIAGNOSIS — R58 Hemorrhage, not elsewhere classified: Secondary | ICD-10-CM | POA: Diagnosis not present

## 2022-08-07 HISTORY — PX: I & D EXTREMITY: SHX5045

## 2022-08-07 HISTORY — PX: AMPUTATION: SHX166

## 2022-08-07 LAB — CBC
HCT: 45.7 % (ref 39.0–52.0)
Hemoglobin: 14.8 g/dL (ref 13.0–17.0)
MCH: 31.3 pg (ref 26.0–34.0)
MCHC: 32.4 g/dL (ref 30.0–36.0)
MCV: 96.6 fL (ref 80.0–100.0)
Platelets: 185 10*3/uL (ref 150–400)
RBC: 4.73 MIL/uL (ref 4.22–5.81)
RDW: 15 % (ref 11.5–15.5)
WBC: 5.4 10*3/uL (ref 4.0–10.5)
nRBC: 0 % (ref 0.0–0.2)

## 2022-08-07 LAB — PROTIME-INR
INR: 1.2 (ref 0.8–1.2)
Prothrombin Time: 14.7 seconds (ref 11.4–15.2)

## 2022-08-07 LAB — I-STAT CHEM 8, ED
BUN: 18 mg/dL (ref 8–23)
Calcium, Ion: 1.12 mmol/L — ABNORMAL LOW (ref 1.15–1.40)
Chloride: 105 mmol/L (ref 98–111)
Creatinine, Ser: 0.9 mg/dL (ref 0.61–1.24)
Glucose, Bld: 131 mg/dL — ABNORMAL HIGH (ref 70–99)
HCT: 44 % (ref 39.0–52.0)
Hemoglobin: 15 g/dL (ref 13.0–17.0)
Potassium: 4 mmol/L (ref 3.5–5.1)
Sodium: 139 mmol/L (ref 135–145)
TCO2: 26 mmol/L (ref 22–32)

## 2022-08-07 LAB — COMPREHENSIVE METABOLIC PANEL
ALT: 45 U/L — ABNORMAL HIGH (ref 0–44)
AST: 36 U/L (ref 15–41)
Albumin: 3.5 g/dL (ref 3.5–5.0)
Alkaline Phosphatase: 60 U/L (ref 38–126)
Anion gap: 8 (ref 5–15)
BUN: 14 mg/dL (ref 8–23)
CO2: 25 mmol/L (ref 22–32)
Calcium: 9 mg/dL (ref 8.9–10.3)
Chloride: 106 mmol/L (ref 98–111)
Creatinine, Ser: 1.02 mg/dL (ref 0.61–1.24)
GFR, Estimated: >60 mL/min (ref >60)
Glucose, Bld: 131 mg/dL — ABNORMAL HIGH (ref 70–99)
Potassium: 4.2 mmol/L (ref 3.5–5.1)
Sodium: 139 mmol/L (ref 135–145)
Total Bilirubin: 1.3 mg/dL — ABNORMAL HIGH (ref 0.3–1.2)
Total Protein: 6.5 g/dL (ref 6.5–8.1)

## 2022-08-07 LAB — LACTIC ACID, PLASMA: Lactic Acid, Venous: 1.4 mmol/L (ref 0.5–1.9)

## 2022-08-07 LAB — GLUCOSE, CAPILLARY: Glucose-Capillary: 137 mg/dL — ABNORMAL HIGH (ref 70–99)

## 2022-08-07 LAB — SAMPLE TO BLOOD BANK

## 2022-08-07 LAB — ETHANOL: Alcohol, Ethyl (B): <10 mg/dL (ref <10)

## 2022-08-07 SURGERY — IRRIGATION AND DEBRIDEMENT EXTREMITY
Anesthesia: General | Site: Hand | Laterality: Left

## 2022-08-07 MED ORDER — SUGAMMADEX SODIUM 200 MG/2ML IV SOLN
INTRAVENOUS | Status: DC | PRN
  Administered 2022-08-07: 200 mg via INTRAVENOUS

## 2022-08-07 MED ORDER — FENTANYL CITRATE (PF) 250 MCG/5ML IJ SOLN
INTRAMUSCULAR | Status: AC
  Filled 2022-08-07: qty 5

## 2022-08-07 MED ORDER — FENTANYL CITRATE (PF) 100 MCG/2ML IJ SOLN
INTRAMUSCULAR | Status: AC
  Filled 2022-08-07: qty 2

## 2022-08-07 MED ORDER — FENTANYL CITRATE (PF) 250 MCG/5ML IJ SOLN
INTRAMUSCULAR | Status: DC | PRN
  Administered 2022-08-07 (×5): 50 ug via INTRAVENOUS
  Administered 2022-08-07: 100 ug via INTRAVENOUS

## 2022-08-07 MED ORDER — LIDOCAINE 2% (20 MG/ML) 5 ML SYRINGE
INTRAMUSCULAR | Status: DC | PRN
  Administered 2022-08-07: 100 mg via INTRAVENOUS

## 2022-08-07 MED ORDER — SUCCINYLCHOLINE CHLORIDE 200 MG/10ML IV SOSY
PREFILLED_SYRINGE | INTRAVENOUS | Status: AC
  Filled 2022-08-07: qty 10

## 2022-08-07 MED ORDER — CEFAZOLIN SODIUM-DEXTROSE 2-4 GM/100ML-% IV SOLN
2.0000 g | Freq: Once | INTRAVENOUS | Status: AC
  Administered 2022-08-07: 2 g via INTRAVENOUS
  Filled 2022-08-07: qty 100

## 2022-08-07 MED ORDER — ACETAMINOPHEN 500 MG PO TABS
ORAL_TABLET | ORAL | Status: AC
  Filled 2022-08-07: qty 2

## 2022-08-07 MED ORDER — ACETAMINOPHEN 160 MG/5ML PO SOLN: 1000.0000 mg | Freq: Once | ORAL | Status: AC | PRN

## 2022-08-07 MED ORDER — MORPHINE SULFATE (PF) 4 MG/ML IV SOLN
4.0000 mg | Freq: Once | INTRAVENOUS | Status: AC
  Administered 2022-08-07: 4 mg via INTRAVENOUS
  Filled 2022-08-07: qty 1

## 2022-08-07 MED ORDER — FENTANYL CITRATE (PF) 100 MCG/2ML IJ SOLN
25.0000 ug | INTRAMUSCULAR | Status: DC | PRN
  Administered 2022-08-07: 50 ug via INTRAVENOUS
  Administered 2022-08-07 (×2): 25 ug via INTRAVENOUS
  Administered 2022-08-07 (×2): 50 ug via INTRAVENOUS

## 2022-08-07 MED ORDER — ACETAMINOPHEN 500 MG PO TABS
1000.0000 mg | ORAL_TABLET | Freq: Once | ORAL | Status: AC | PRN
  Administered 2022-08-07: 1000 mg via ORAL

## 2022-08-07 MED ORDER — PHENYLEPHRINE 80 MCG/ML (10ML) SYRINGE FOR IV PUSH (FOR BLOOD PRESSURE SUPPORT)
PREFILLED_SYRINGE | INTRAVENOUS | Status: AC
  Filled 2022-08-07: qty 10

## 2022-08-07 MED ORDER — OXYCODONE HCL 5 MG PO TABS
ORAL_TABLET | ORAL | Status: AC
  Filled 2022-08-07: qty 1

## 2022-08-07 MED ORDER — CEFAZOLIN SODIUM-DEXTROSE 2-3 GM-%(50ML) IV SOLR
INTRAVENOUS | Status: DC | PRN
  Administered 2022-08-07: 2 g via INTRAVENOUS

## 2022-08-07 MED ORDER — BACITRACIN ZINC 500 UNIT/GM EX OINT
TOPICAL_OINTMENT | CUTANEOUS | Status: AC
  Filled 2022-08-07: qty 28.35

## 2022-08-07 MED ORDER — BUPIVACAINE HCL (PF) 0.25 % IJ SOLN
INTRAMUSCULAR | Status: AC
  Filled 2022-08-07: qty 30

## 2022-08-07 MED ORDER — ROCURONIUM BROMIDE 10 MG/ML (PF) SYRINGE
PREFILLED_SYRINGE | INTRAVENOUS | Status: AC
  Filled 2022-08-07: qty 10

## 2022-08-07 MED ORDER — TETANUS-DIPHTH-ACELL PERTUSSIS 5-2.5-18.5 LF-MCG/0.5 IM SUSY
0.5000 mL | PREFILLED_SYRINGE | Freq: Once | INTRAMUSCULAR | Status: AC
  Administered 2022-08-07: 0.5 mL via INTRAMUSCULAR
  Filled 2022-08-07: qty 0.5

## 2022-08-07 MED ORDER — ALBUMIN HUMAN 5 % IV SOLN: INTRAVENOUS | Status: DC | PRN

## 2022-08-07 MED ORDER — OXYCODONE HCL 5 MG PO TABS
5.0000 mg | ORAL_TABLET | Freq: Once | ORAL | Status: AC | PRN
  Administered 2022-08-07: 5 mg via ORAL

## 2022-08-07 MED ORDER — PROPOFOL 10 MG/ML IV BOLUS
INTRAVENOUS | Status: DC | PRN
  Administered 2022-08-07: 200 mg via INTRAVENOUS

## 2022-08-07 MED ORDER — OXYCODONE-ACETAMINOPHEN 5-325 MG PO TABS: 1.0000 | ORAL_TABLET | Freq: Four times a day (QID) | ORAL | 0 refills | Status: AC | PRN

## 2022-08-07 MED ORDER — ROCURONIUM BROMIDE 10 MG/ML (PF) SYRINGE
PREFILLED_SYRINGE | INTRAVENOUS | Status: DC | PRN
  Administered 2022-08-07: 30 mg via INTRAVENOUS

## 2022-08-07 MED ORDER — EPHEDRINE 5 MG/ML INJ
INTRAVENOUS | Status: AC
  Filled 2022-08-07: qty 5

## 2022-08-07 MED ORDER — INSULIN ASPART 100 UNIT/ML IJ SOLN: 0.0000 [IU] | INTRAMUSCULAR | Status: DC | PRN

## 2022-08-07 MED ORDER — CEFAZOLIN SODIUM-DEXTROSE 2-4 GM/100ML-% IV SOLN
INTRAVENOUS | Status: AC
  Filled 2022-08-07: qty 100

## 2022-08-07 MED ORDER — SODIUM CHLORIDE 0.9 % IR SOLN
Status: DC | PRN
  Administered 2022-08-07 (×2): 3000 mL

## 2022-08-07 MED ORDER — ONDANSETRON HCL 4 MG/2ML IJ SOLN
INTRAMUSCULAR | Status: AC
  Filled 2022-08-07: qty 2

## 2022-08-07 MED ORDER — LIDOCAINE 2% (20 MG/ML) 5 ML SYRINGE
INTRAMUSCULAR | Status: AC
  Filled 2022-08-07: qty 5

## 2022-08-07 MED ORDER — PHENYLEPHRINE 80 MCG/ML (10ML) SYRINGE FOR IV PUSH (FOR BLOOD PRESSURE SUPPORT)
PREFILLED_SYRINGE | INTRAVENOUS | Status: DC | PRN
  Administered 2022-08-07 (×5): 160 ug via INTRAVENOUS

## 2022-08-07 MED ORDER — EPHEDRINE SULFATE-NACL 50-0.9 MG/10ML-% IV SOSY
PREFILLED_SYRINGE | INTRAVENOUS | Status: DC | PRN
  Administered 2022-08-07 (×5): 5 mg via INTRAVENOUS

## 2022-08-07 MED ORDER — ARTIFICIAL TEARS OPHTHALMIC OINT
TOPICAL_OINTMENT | OPHTHALMIC | Status: AC
  Filled 2022-08-07: qty 3.5

## 2022-08-07 MED ORDER — ORAL CARE MOUTH RINSE: 15.0000 mL | Freq: Once | OROMUCOSAL | Status: AC

## 2022-08-07 MED ORDER — BACITRACIN ZINC 500 UNIT/GM EX OINT
TOPICAL_OINTMENT | CUTANEOUS | Status: DC | PRN
  Administered 2022-08-07: 1 via TOPICAL

## 2022-08-07 MED ORDER — 0.9 % SODIUM CHLORIDE (POUR BTL) OPTIME
TOPICAL | Status: DC | PRN
  Administered 2022-08-07: 1000 mL

## 2022-08-07 MED ORDER — SUCCINYLCHOLINE CHLORIDE 200 MG/10ML IV SOSY
PREFILLED_SYRINGE | INTRAVENOUS | Status: DC | PRN
  Administered 2022-08-07: 160 mg via INTRAVENOUS

## 2022-08-07 MED ORDER — OXYCODONE HCL 5 MG/5ML PO SOLN: 5.0000 mg | Freq: Once | ORAL | Status: AC | PRN

## 2022-08-07 MED ORDER — PROPOFOL 10 MG/ML IV BOLUS
INTRAVENOUS | Status: AC
  Filled 2022-08-07: qty 20

## 2022-08-07 MED ORDER — ACETAMINOPHEN 10 MG/ML IV SOLN: 1000.0000 mg | Freq: Once | INTRAVENOUS | Status: DC | PRN

## 2022-08-07 MED ORDER — LACTATED RINGERS IV SOLN: INTRAVENOUS | Status: DC

## 2022-08-07 MED ORDER — CHLORHEXIDINE GLUCONATE 0.12 % MT SOLN
15.0000 mL | Freq: Once | OROMUCOSAL | Status: AC
  Administered 2022-08-07: 15 mL via OROMUCOSAL

## 2022-08-07 MED ORDER — ONDANSETRON HCL 4 MG/2ML IJ SOLN
INTRAMUSCULAR | Status: DC | PRN
  Administered 2022-08-07: 4 mg via INTRAVENOUS

## 2022-08-07 SURGICAL SUPPLY — 60 items
BAG COUNTER SPONGE SURGICOUNT (BAG) ×2 IMPLANT
BNDG COHESIVE 1X5 TAN STRL LF (GAUZE/BANDAGES/DRESSINGS) IMPLANT
BNDG CONFORM 2 STRL LF (GAUZE/BANDAGES/DRESSINGS) IMPLANT
BNDG ELASTIC 3X5.8 VLCR STR LF (GAUZE/BANDAGES/DRESSINGS) ×2 IMPLANT
BNDG ELASTIC 4X5.8 VLCR STR LF (GAUZE/BANDAGES/DRESSINGS) ×2 IMPLANT
BNDG ESMARK 4X9 LF (GAUZE/BANDAGES/DRESSINGS) ×2 IMPLANT
BNDG GAUZE DERMACEA FLUFF 4 (GAUZE/BANDAGES/DRESSINGS) ×2 IMPLANT
CORD BIPOLAR FORCEPS 12FT (ELECTRODE) ×2 IMPLANT
COVER SURGICAL LIGHT HANDLE (MISCELLANEOUS) ×2 IMPLANT
CUFF TOURN SGL QUICK 18X4 (TOURNIQUET CUFF) ×2 IMPLANT
CUFF TOURN SGL QUICK 24 (TOURNIQUET CUFF)
CUFF TRNQT CYL 24X4X16.5-23 (TOURNIQUET CUFF) IMPLANT
DRAIN PENROSE 1/4X12 LTX STRL (WOUND CARE) IMPLANT
DRAPE SURG 17X23 STRL (DRAPES) ×2 IMPLANT
DRSG ADAPTIC 3X8 NADH LF (GAUZE/BANDAGES/DRESSINGS) ×2 IMPLANT
ELECT REM PT RETURN 9FT ADLT (ELECTROSURGICAL) ×2
ELECTRODE REM PT RTRN 9FT ADLT (ELECTROSURGICAL) IMPLANT
GAUZE SPONGE 4X4 12PLY STRL (GAUZE/BANDAGES/DRESSINGS) ×2 IMPLANT
GAUZE XEROFORM 1X8 LF (GAUZE/BANDAGES/DRESSINGS) ×2 IMPLANT
GAUZE XEROFORM 5X9 LF (GAUZE/BANDAGES/DRESSINGS) IMPLANT
GLOVE BIO SURGEON STRL SZ7.5 (GLOVE) ×2 IMPLANT
GLOVE BIOGEL PI IND STRL 8.5 (GLOVE) ×2 IMPLANT
GLOVE SURG ORTHO 8.0 STRL STRW (GLOVE) ×2 IMPLANT
GLOVE SURG UNDER POLY LF SZ7.5 (GLOVE) ×4 IMPLANT
GOWN STRL REUS W/ TWL LRG LVL3 (GOWN DISPOSABLE) ×6 IMPLANT
GOWN STRL REUS W/ TWL XL LVL3 (GOWN DISPOSABLE) ×2 IMPLANT
GOWN STRL REUS W/TWL LRG LVL3 (GOWN DISPOSABLE) ×6
GOWN STRL REUS W/TWL XL LVL3 (GOWN DISPOSABLE) ×2
HANDPIECE INTERPULSE COAX TIP (DISPOSABLE)
KIT BASIN OR (CUSTOM PROCEDURE TRAY) ×2 IMPLANT
KIT TURNOVER KIT B (KITS) ×2 IMPLANT
MANIFOLD NEPTUNE II (INSTRUMENTS) ×2 IMPLANT
MAT PREVALON FULL STRYKER (MISCELLANEOUS) IMPLANT
NDL HYPO 25GX1X1/2 BEV (NEEDLE) IMPLANT
NEEDLE HYPO 25GX1X1/2 BEV (NEEDLE) IMPLANT
NS IRRIG 1000ML POUR BTL (IV SOLUTION) ×2 IMPLANT
PACK ORTHO EXTREMITY (CUSTOM PROCEDURE TRAY) ×2 IMPLANT
PAD ARMBOARD 7.5X6 YLW CONV (MISCELLANEOUS) ×4 IMPLANT
PAD CAST 4YDX4 CTTN HI CHSV (CAST SUPPLIES) ×2 IMPLANT
PADDING CAST COTTON 4X4 STRL (CAST SUPPLIES) ×2
PILLOW ARM CARTER ADULT (MISCELLANEOUS) IMPLANT
SET CYSTO W/LG BORE CLAMP LF (SET/KITS/TRAYS/PACK) IMPLANT
SET HNDPC FAN SPRY TIP SCT (DISPOSABLE) IMPLANT
SOAP 2 % CHG 4 OZ (WOUND CARE) ×2 IMPLANT
SPONGE T-LAP 18X18 ~~LOC~~+RFID (SPONGE) ×2 IMPLANT
SPONGE T-LAP 4X18 ~~LOC~~+RFID (SPONGE) ×2 IMPLANT
SUT CHROMIC 4 0 PS 2 18 (SUTURE) IMPLANT
SUT ETHILON 4 0 PS 2 18 (SUTURE) IMPLANT
SUT ETHILON 5 0 P 3 18 (SUTURE)
SUT NYLON ETHILON 5-0 P-3 1X18 (SUTURE) IMPLANT
SUT PDS AB 3-0 SH 27 (SUTURE) IMPLANT
SWAB COLLECTION DEVICE MRSA (MISCELLANEOUS) ×2 IMPLANT
SWAB CULTURE ESWAB REG 1ML (MISCELLANEOUS) IMPLANT
SYR CONTROL 10ML LL (SYRINGE) IMPLANT
TOWEL GREEN STERILE (TOWEL DISPOSABLE) ×2 IMPLANT
TOWEL GREEN STERILE FF (TOWEL DISPOSABLE) ×2 IMPLANT
TUBE CONNECTING 12X1/4 (SUCTIONS) ×2 IMPLANT
UNDERPAD 30X36 HEAVY ABSORB (UNDERPADS AND DIAPERS) ×2 IMPLANT
WATER STERILE IRR 1000ML POUR (IV SOLUTION) ×2 IMPLANT
YANKAUER SUCT BULB TIP NO VENT (SUCTIONS) ×2 IMPLANT

## 2022-08-07 NOTE — ED Notes (Signed)
Quick clot and pressure dressing applied. Hand still bleeding. EDP aware.
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
## 2022-08-07 NOTE — Anesthesia Preprocedure Evaluation (Signed)
Anesthesia Evaluation  Patient identified by MRN, date of birth, ID band Patient awake    Reviewed: Allergy & Precautions, NPO status , Patient's Chart, lab work & pertinent test results  History of Anesthesia Complications Negative for: history of anesthetic complications  Airway Mallampati: III  TM Distance: >3 FB Neck ROM: Full    Dental  (+) Teeth Intact, Dental Advisory Given   Pulmonary neg shortness of breath, sleep apnea , neg COPD, neg recent URI,    breath sounds clear to auscultation       Cardiovascular hypertension, Pt. on medications and Pt. on home beta blockers (-) angina+CHF  (-) Past MI  Rhythm:Regular  S/p ablation for afib   Neuro/Psych negative neurological ROS  negative psych ROS   GI/Hepatic Neg liver ROS, GERD  Medicated and Controlled,  Endo/Other  diabetes, Insulin DependentLab Results      Component                Value               Date                      HGBA1C                   7.9 (H)             07/19/2021             Renal/GU negative Renal ROSLab Results      Component                Value               Date                      CREATININE               0.90                08/07/2022                Musculoskeletal   Abdominal   Peds  Hematology  (+) Blood dyscrasia, , Lab Results      Component                Value               Date                      WBC                      5.4                 08/07/2022                HGB                      15.0                08/07/2022                HCT                      44.0                08/07/2022                MCV  96.6                08/07/2022                PLT                      185                 08/07/2022            eliquis   Anesthesia Other Findings   Reproductive/Obstetrics                             Anesthesia Physical Anesthesia Plan  ASA: 3  Anesthesia  Plan: General   Post-op Pain Management: Ofirmev IV (intra-op)*   Induction: Intravenous, Rapid sequence and Cricoid pressure planned  PONV Risk Score and Plan: 2 and Ondansetron and Dexamethasone  Airway Management Planned: Oral ETT  Additional Equipment: None  Intra-op Plan:   Post-operative Plan: Extubation in OR  Informed Consent: I have reviewed the patients History and Physical, chart, labs and discussed the procedure including the risks, benefits and alternatives for the proposed anesthesia with the patient or authorized representative who has indicated his/her understanding and acceptance.     Dental advisory given  Plan Discussed with: CRNA  Anesthesia Plan Comments:         Anesthesia Quick Evaluation
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
## 2022-08-07 NOTE — Discharge Instructions (Signed)
  Orthopaedic Hand Surgery Discharge Instructions  WEIGHT BEARING STATUS: Non weight bearing on operative extremity  DRESSING CARE: Please keep your dressing/splint/cast clean and dry until your follow-up appointment. You may shower by placing a waterproof covering over your dressing/splint/cast. Contact your surgeon if your splint/cast gets wet. It will need to be changed to prevent skin breakdown.  PAIN CONTROL: First line medications for post operative pain control are Tylenol (acetaminophen) and Motrin (ibuprofen) if you are able to take these medications. If you have been prescribed a medication these can be taken as breakthrough pain medications. Please note that some narcotic pain medication has acetaminophen added and you should never consume more than 4,000mg of acetaminophen in 24-hour period. Please note that if you are given Toradol (ketorolac) you should not take similar medications such as ibuprofen or naproxen.  DISCHARGE MEDICATIONS: If you have been prescribed medication it was sent electronically to your pharmacy. No changes have been made to your home medications.  ICE/ELEVATION: Ice and elevate your injured extremity as needed. Avoid direct contact of ice with skin.   BANDAGE FEELS TOO TIGHT: If your bandage feels too tight, first make sure you are elevating your fingers as much as possible. The outer layer of the bandage can be unwrapped and reapplied more loosely. If no improvement, you may carefully cut the inner layer longitudinally until the pressure has resolved and then rewrap the outer layer. If you are not comfortable with these instructions, please call the office and the bandage can be changed for you.   FOLLOW UP: You will be called after surgery with an appointment date and time, however if you have not received a phone call within 3 days, please call during regular office hours at [phone] to schedule a post operative appointment.  Please Seek Medical Attention  if: Call MD for: pain or pressure in chest, jaw, arm, back, neck  Call MD for: temperature greater than 101 F for more than 24 hrs Call MD for: difficulty breathing Call MD for: incision redness, bleeding, drainage  Call MD for: palpitations or feeling that the heart is racing  Call MD for: increased swelling in arm, leg, ankle, or abdomen  Call MD for: lightheadedness, dizziness, fainting Call 911 or go to ER for any medical emergency if you are not able to get in touch with your doctor   J. Reid Nayshawn Mesta, MD Orthopaedic Hand Surgeon [facility] Office number: [phone] [address] [place], [place] [address]  

## 2022-08-07 NOTE — Transfer of Care (Signed)
Immediate Anesthesia Transfer of Care Note  Patient: Sean [name].  Procedure(s) Performed: IRRIGATION AND DEBRIDEMENT (Left: Hand) AMPUTATION FIFTH DIGIT (Left: Finger)  Patient Location: PACU  Anesthesia Type:General  Level of Consciousness: awake, alert  and oriented  Airway & Oxygen Therapy: Patient Spontanous Breathing and Patient connected to face mask oxygen  Post-op Assessment: Report given to RN and Post -op Vital signs reviewed and stable  Post vital signs: Reviewed and stable  Last Vitals:  Vitals Value Taken Time  BP 191/114 08/07/22 2137  Temp    Pulse 73 08/07/22 2142  Resp 18 08/07/22 2142  SpO2 100 % 08/07/22 2142  Vitals shown include unvalidated device data.  Last Pain:  Vitals:   08/07/22 1929  TempSrc:   PainSc: 3          Complications: No notable events documented.

## 2022-08-07 NOTE — Anesthesia Procedure Notes (Signed)
Procedure Name: Intubation Date/Time: 08/07/2022 8:10 PM  Performed by: Alain Marion, CRNAPre-anesthesia Checklist: Patient identified, Emergency Drugs available, Suction available and Patient being monitored Patient Re-evaluated:Patient Re-evaluated prior to induction Oxygen Delivery Method: Circle System Utilized Preoxygenation: Pre-oxygenation with 100% oxygen Induction Type: IV induction and Rapid sequence Laryngoscope Size: Miller and 3 Grade View: Grade I Tube type: Oral Tube size: 7.5 mm Number of attempts: 1 Airway Equipment and Method: Stylet Placement Confirmation: ETT inserted through vocal cords under direct vision, positive ETCO2 and breath sounds checked- equal and bilateral Secured at: 22 cm Tube secured with: Tape Dental Injury: Teeth and Oropharynx as per pre-operative assessment

## 2022-08-08 ENCOUNTER — Encounter (HOSPITAL_COMMUNITY): Payer: Self-pay | Admitting: Orthopedic Surgery

## 2022-08-08 LAB — GLUCOSE, CAPILLARY: Glucose-Capillary: 141 mg/dL — ABNORMAL HIGH (ref 70–99)

## 2022-08-09 DIAGNOSIS — M79642 Pain in left hand: Secondary | ICD-10-CM | POA: Insufficient documentation

## 2022-08-09 HISTORY — DX: Pain in left hand: M79.642

## 2022-08-09 LAB — SURGICAL PATHOLOGY

## 2022-08-09 NOTE — Anesthesia Postprocedure Evaluation (Signed)
Anesthesia Post Note  Patient: Sean [name].  Procedure(s) Performed: IRRIGATION AND DEBRIDEMENT (Left: Hand) AMPUTATION FIFTH DIGIT (Left: Finger)     Patient location during evaluation: PACU Anesthesia Type: General Level of consciousness: awake and alert Pain management: pain level controlled Vital Signs Assessment: post-procedure vital signs reviewed and stable Respiratory status: spontaneous breathing, nonlabored ventilation, respiratory function stable and patient connected to nasal cannula oxygen Cardiovascular status: blood pressure returned to baseline and stable Postop Assessment: no apparent nausea or vomiting Anesthetic complications: no   No notable events documented.  Last Vitals:  Vitals:   08/07/22 2245 08/07/22 2300  BP: 139/85 125/76  Pulse: 79 81  Resp: 17 19  Temp:  (!) 36.1 C  SpO2: 95% 95%    Last Pain:  Vitals:   08/07/22 2300  TempSrc:   PainSc: 2                  Mare Ludtke

## 2022-08-10 DIAGNOSIS — J209 Acute bronchitis, unspecified: Secondary | ICD-10-CM | POA: Diagnosis not present

## 2022-08-10 DIAGNOSIS — J069 Acute upper respiratory infection, unspecified: Secondary | ICD-10-CM | POA: Diagnosis not present

## 2022-08-10 DIAGNOSIS — Z09 Encounter for follow-up examination after completed treatment for conditions other than malignant neoplasm: Secondary | ICD-10-CM | POA: Diagnosis not present

## 2022-08-10 DIAGNOSIS — S6992XA Unspecified injury of left wrist, hand and finger(s), initial encounter: Secondary | ICD-10-CM | POA: Diagnosis not present

## 2022-08-10 DIAGNOSIS — Z6841 Body Mass Index (BMI) 40.0 and over, adult: Secondary | ICD-10-CM | POA: Diagnosis not present

## 2022-08-11 DIAGNOSIS — S62307A Unspecified fracture of fifth metacarpal bone, left hand, initial encounter for closed fracture: Secondary | ICD-10-CM

## 2022-08-11 DIAGNOSIS — S62329B Displaced fracture of shaft of unspecified metacarpal bone, initial encounter for open fracture: Secondary | ICD-10-CM

## 2022-08-11 HISTORY — DX: Displaced fracture of shaft of unspecified metacarpal bone, initial encounter for open fracture: S62.329B

## 2022-08-11 HISTORY — DX: Unspecified fracture of fifth metacarpal bone, left hand, initial encounter for closed fracture: S62.307A

## 2022-08-20 DIAGNOSIS — G4700 Insomnia, unspecified: Secondary | ICD-10-CM | POA: Diagnosis not present

## 2022-08-20 DIAGNOSIS — J309 Allergic rhinitis, unspecified: Secondary | ICD-10-CM | POA: Diagnosis not present

## 2022-08-20 DIAGNOSIS — E1121 Type 2 diabetes mellitus with diabetic nephropathy: Secondary | ICD-10-CM | POA: Diagnosis not present

## 2022-08-20 DIAGNOSIS — E559 Vitamin D deficiency, unspecified: Secondary | ICD-10-CM | POA: Diagnosis not present

## 2022-08-20 DIAGNOSIS — I4891 Unspecified atrial fibrillation: Secondary | ICD-10-CM | POA: Diagnosis not present

## 2022-08-20 DIAGNOSIS — Z6841 Body Mass Index (BMI) 40.0 and over, adult: Secondary | ICD-10-CM | POA: Diagnosis not present

## 2022-08-23 DIAGNOSIS — S62327B Displaced fracture of shaft of fifth metacarpal bone, left hand, initial encounter for open fracture: Secondary | ICD-10-CM | POA: Diagnosis not present

## 2022-08-23 DIAGNOSIS — M79642 Pain in left hand: Secondary | ICD-10-CM | POA: Diagnosis not present

## 2022-08-23 DIAGNOSIS — Y249XXA Unspecified firearm discharge, undetermined intent, initial encounter: Secondary | ICD-10-CM | POA: Diagnosis not present

## 2022-09-13 DIAGNOSIS — M25642 Stiffness of left hand, not elsewhere classified: Secondary | ICD-10-CM | POA: Diagnosis not present

## 2022-09-19 DIAGNOSIS — E538 Deficiency of other specified B group vitamins: Secondary | ICD-10-CM | POA: Diagnosis not present

## 2022-09-19 DIAGNOSIS — I10 Essential (primary) hypertension: Secondary | ICD-10-CM | POA: Diagnosis not present

## 2022-09-19 DIAGNOSIS — E785 Hyperlipidemia, unspecified: Secondary | ICD-10-CM | POA: Diagnosis not present

## 2022-09-19 DIAGNOSIS — E1121 Type 2 diabetes mellitus with diabetic nephropathy: Secondary | ICD-10-CM | POA: Diagnosis not present

## 2022-09-19 DIAGNOSIS — E559 Vitamin D deficiency, unspecified: Secondary | ICD-10-CM | POA: Diagnosis not present

## 2022-09-19 DIAGNOSIS — G4730 Sleep apnea, unspecified: Secondary | ICD-10-CM | POA: Diagnosis not present

## 2022-09-19 DIAGNOSIS — G4700 Insomnia, unspecified: Secondary | ICD-10-CM | POA: Diagnosis not present

## 2022-09-19 DIAGNOSIS — D649 Anemia, unspecified: Secondary | ICD-10-CM | POA: Diagnosis not present

## 2022-10-01 ENCOUNTER — Ambulatory Visit: Payer: PPO | Admitting: Cardiology

## 2022-10-12 DIAGNOSIS — I10 Essential (primary) hypertension: Secondary | ICD-10-CM | POA: Diagnosis not present

## 2022-10-12 DIAGNOSIS — G9332 Myalgic encephalomyelitis/chronic fatigue syndrome: Secondary | ICD-10-CM | POA: Diagnosis not present

## 2022-10-12 DIAGNOSIS — K219 Gastro-esophageal reflux disease without esophagitis: Secondary | ICD-10-CM | POA: Diagnosis not present

## 2022-10-12 DIAGNOSIS — U099 Post covid-19 condition, unspecified: Secondary | ICD-10-CM | POA: Diagnosis not present

## 2022-10-12 DIAGNOSIS — G4700 Insomnia, unspecified: Secondary | ICD-10-CM | POA: Diagnosis not present

## 2022-10-12 DIAGNOSIS — N401 Enlarged prostate with lower urinary tract symptoms: Secondary | ICD-10-CM | POA: Diagnosis not present

## 2022-10-12 DIAGNOSIS — G2581 Restless legs syndrome: Secondary | ICD-10-CM | POA: Diagnosis not present

## 2022-10-18 DIAGNOSIS — Z6841 Body Mass Index (BMI) 40.0 and over, adult: Secondary | ICD-10-CM | POA: Diagnosis not present

## 2022-10-18 DIAGNOSIS — L02212 Cutaneous abscess of back [any part, except buttock]: Secondary | ICD-10-CM | POA: Diagnosis not present

## 2022-10-19 ENCOUNTER — Other Ambulatory Visit: Payer: Self-pay

## 2022-10-19 DIAGNOSIS — I509 Heart failure, unspecified: Secondary | ICD-10-CM | POA: Insufficient documentation

## 2022-10-19 DIAGNOSIS — J309 Allergic rhinitis, unspecified: Secondary | ICD-10-CM | POA: Insufficient documentation

## 2022-10-19 DIAGNOSIS — E119 Type 2 diabetes mellitus without complications: Secondary | ICD-10-CM | POA: Insufficient documentation

## 2022-10-19 DIAGNOSIS — E785 Hyperlipidemia, unspecified: Secondary | ICD-10-CM | POA: Insufficient documentation

## 2022-10-19 DIAGNOSIS — I4819 Other persistent atrial fibrillation: Secondary | ICD-10-CM | POA: Insufficient documentation

## 2022-10-19 DIAGNOSIS — I499 Cardiac arrhythmia, unspecified: Secondary | ICD-10-CM | POA: Insufficient documentation

## 2022-10-19 DIAGNOSIS — I10 Essential (primary) hypertension: Secondary | ICD-10-CM | POA: Insufficient documentation

## 2022-10-23 ENCOUNTER — Ambulatory Visit: Payer: Medicare Other | Attending: Cardiology | Admitting: Cardiology

## 2022-10-23 ENCOUNTER — Encounter: Payer: Self-pay | Admitting: Cardiology

## 2022-10-23 VITALS — BP 180/82 | HR 69 | Ht 74.0 in | Wt 307.2 lb

## 2022-10-23 DIAGNOSIS — E782 Mixed hyperlipidemia: Secondary | ICD-10-CM | POA: Insufficient documentation

## 2022-10-23 DIAGNOSIS — I480 Paroxysmal atrial fibrillation: Secondary | ICD-10-CM | POA: Insufficient documentation

## 2022-10-23 DIAGNOSIS — R0989 Other specified symptoms and signs involving the circulatory and respiratory systems: Secondary | ICD-10-CM

## 2022-10-23 DIAGNOSIS — G4733 Obstructive sleep apnea (adult) (pediatric): Secondary | ICD-10-CM | POA: Diagnosis not present

## 2022-10-23 DIAGNOSIS — Z8679 Personal history of other diseases of the circulatory system: Secondary | ICD-10-CM | POA: Diagnosis not present

## 2022-10-23 DIAGNOSIS — I10 Essential (primary) hypertension: Secondary | ICD-10-CM | POA: Insufficient documentation

## 2022-10-23 DIAGNOSIS — Z9889 Other specified postprocedural states: Secondary | ICD-10-CM | POA: Insufficient documentation

## 2022-10-23 DIAGNOSIS — Z86711 Personal history of pulmonary embolism: Secondary | ICD-10-CM | POA: Insufficient documentation

## 2022-10-23 HISTORY — DX: Other specified symptoms and signs involving the circulatory and respiratory systems: R09.89

## 2022-10-23 MED ORDER — SPIRONOLACTONE 25 MG PO TABS: 25.0000 mg | ORAL_TABLET | Freq: Every day | ORAL | 3 refills | Status: DC

## 2022-10-23 MED ORDER — METOPROLOL SUCCINATE ER 50 MG PO TB24: 50.0000 mg | ORAL_TABLET | Freq: Two times a day (BID) | ORAL | 2 refills | Status: DC

## 2022-10-23 NOTE — Progress Notes (Signed)
Cardiology Office Note:    Date:  10/23/2022   ID:  Sean [name]., DOB [birth year], MRN [id number]  PCP:  Sean Johns, MD  Cardiologist:  Sean Lindau, MD   Referring MD: Sean Johns, MD    ASSESSMENT:    1. Essential hypertension   2. PAF (paroxysmal atrial fibrillation) ([place])   3. OSA (obstructive sleep apnea)   4. Morbid obesity ([place])   5. S/P ablation of atrial fibrillation   6. Mixed hyperlipidemia   7. History of pulmonary embolism    PLAN:    In order of problems listed above:  Primary prevention stressed with the patient.  Importance of compliance with diet medication stressed and he vocalized understanding.  He was advised to walk to the best of his ability on a regular basis. Paroxysmal atrial fibrillation: Post ablation:I discussed with the patient atrial fibrillation, disease process. Management and therapy including rate and rhythm control, anticoagulation benefits and potential risks were discussed extensively with the patient. Patient had multiple questions which were answered to patient's satisfaction. Essential hypertension: Blood pressure is elevated.  Diet and salt intake issues were discussed.  I added spironolactone 50 mg half tablet daily.  He will keep a track of pulse blood pressure.  He will have a Chem-7 today.  He will be back in a week for a nurse visit will have a Chem-7 and pulse blood pressure check at that time. Mixed dyslipidemia: On lipid-lowering medications followed by primary care. Diabetes mellitus and obesity: Weight reduction stressed risks of obesity explained and he promises to do better. Uncontrolled hypertension: Abdominal bruit: We will do a renal arterial ultrasound to rule out renal artery stenosis. Flecainide therapy: I discussed this with him at length.  Will do a stress test when he sees me in follow-up appointment in a month.  By this time hopefully his blood pressure issues would have settled.   Medication Adjustments/Labs  and Tests Ordered: Current medicines are reviewed at length with the patient today.  Concerns regarding medicines are outlined above.  No orders of the defined types were placed in this encounter.  No orders of the defined types were placed in this encounter.    No chief complaint on file.    History of Present Illness:    Sean [name]. is a 71 y.o. male.  Patient has past medical history of essential hypertension, diabetes mellitus, dyslipidemia, history of PE, atrial fibrillation post ablation.  He denies any problems at this time and takes care of activities of daily living.  No chest pain orthopnea or PND.  His only issue is about elevated blood pressure.  At the time of my evaluation, the patient is alert awake oriented and in no distress.  Past Medical History:  Diagnosis Date   Allergic rhinitis    Arrhythmia    Atrial fibrillation ([place])    Atrial flutter ([place])    Barrett's esophagus 08/03/2020   Benign prostatic hyperplasia with urinary obstruction 11/29/2018   Cardiomyopathy ([place]) 02/23/2020   CHF (congestive heart failure) ([place])    Closed fracture of fifth metacarpal bone of left hand 08/11/2022   Diabetes mellitus due to underlying condition with unspecified complications ([place]) 41/96/2229   Diabetes mellitus without complication ([place])    DOE (dyspnea on exertion) 10/29/2019   S/p PE  11/2018 > DOAC recurrent 09/14/2019 off DOAC so resumed  - onset variable doe and orthostatic lightheadedness 06/2019 with afib - PFT's [place]  10/08/2019 :  FEV1 2.79 (73%) with ratio  75 and 6 % better p saba,  Air trapping on lung vol,  ERV 14% and dlco 22.78 (60%) corrects to 3.68 (76%) for vol with minimal concavity to f/v loop  - 10/29/2019   Walked RA x two laps =  approx 554f @ avg   Essential hypertension 09/29/2019   Fissure in skin of foot 12/11/2016   GERD (gastroesophageal reflux disease) 11/29/2018   Hemospermia 02/22/2020   History of colonic polyps 08/03/2020   History of  pulmonary embolism 09/29/2019   Hyperlipidemia    Hypertension    Hypogonadism in male 02/22/2020   IBS (irritable bowel syndrome) 11/29/2018   Impotence 02/22/2020   Incomplete emptying of bladder 02/22/2020   Melena 08/03/2020   Mixed dyslipidemia 09/29/2019   Morbid obesity ([place] 181/07/3158  Nonalcoholic fatty liver disease 08/03/2020   Open fracture of shaft of metacarpal bone 08/11/2022   OSA (obstructive sleep apnea)    Overgrown toenails 12/11/2016   PAF ([place]oxysmal atrial fibrillation) ([place] 02/23/2020   Pain of left hand 08/09/2022   Persistent atrial fibrillation ([place] 09/29/2019   Plantar fasciitis, bilateral 06/05/2016   S/P ablation of atrial fibrillation 02/23/2020   Secondary hypercoagulable state ([place] 01/22/2020   Submucosal lesion of stomach 08/03/2020   Upper airway cough syndrome 12/15/2019   Noted on exam 12/15/2019 onset while on Entresto, resolved with purse lip    Past Surgical History:  Procedure Laterality Date   AMPUTATION Left 08/07/2022   Procedure: AMPUTATION FIFTH DIGIT;  Surgeon: SOrene Desanctis MD;  [facility]  Service: Orthopedics;  Laterality: Left;   ATRIAL FIBRILLATION ABLATION N/A 12/25/2019   Procedure: ATRIAL FIBRILLATION ABLATION;  Surgeon: CConstance Haw MD;  [facility];  Service: Cardiovascular;  Laterality: N/A;   ATRIAL FIBRILLATION ABLATION N/A 07/19/2021   Procedure: ATRIAL FIBRILLATION ABLATION;  Surgeon: CConstance Haw MD;  [facility];  Service: Cardiovascular;  Laterality: N/A;   BIOPSY  08/11/2020   Procedure: BIOPSY;  Surgeon: JMilus Banister MD;  [facility];  Service: Endoscopy;;   CARDIAC CATHETERIZATION     CARDIOVERSION N/A 04/17/2021   Procedure: CARDIOVERSION;  Surgeon: ENelva Bush MD;  Location: [facility];  Service: Cardiovascular;  Laterality: N/A;   ESOPHAGOGASTRODUODENOSCOPY (EGD) WITH PROPOFOL N/A 08/11/2020   Procedure:  ESOPHAGOGASTRODUODENOSCOPY (EGD) WITH PROPOFOL;  Surgeon: JMilus Banister MD;  [facility];  Service: Endoscopy;  Laterality: N/A;   I & D EXTREMITY Left 08/07/2022   Procedure: IRRIGATION AND DEBRIDEMENT;  Surgeon: SOrene Desanctis MD;  [facility]  Service: Orthopedics;  Laterality: Left;   KNEE SURGERY     PILONIDAL CYST / SINUS EXCISION     UPPER ESOPHAGEAL ENDOSCOPIC ULTRASOUND (EUS) N/A 08/11/2020   Procedure: UPPER ESOPHAGEAL ENDOSCOPIC ULTRASOUND (EUS);  Surgeon: JMilus Banister MD;  [facility];  Service: Endoscopy;  Laterality: N/A;    Current Medications: Current Meds  Medication Sig   albuterol (VENTOLIN HFA) 108 (90 Base) MCG/ACT inhaler Inhale 2 puffs into the lungs every 6 (six) hours as needed for wheezing or shortness of breath.    allopurinol (ZYLOPRIM) 100 MG tablet Take 100 mg by mouth in the morning.   amLODipine (NORVASC) 5 MG tablet Take 5 mg by mouth daily.   Ascorbic Acid (VITAMIN C) 1000 MG tablet Take 1,000 mg by mouth 2 (two) times daily.   atorvastatin (LIPITOR) 20 MG tablet Take 20 mg by mouth daily.   Cholecalciferol (VITAMIN D3) 50 MCG (2000 UT)  TABS Take 2,000 Units by mouth 2 (two) times daily.    doxycycline (VIBRAMYCIN) 100 MG capsule Take 100 mg by mouth daily.   ELIQUIS 5 MG TABS tablet Take 1 tablet (5 mg total) by mouth 2 (two) times daily.   empagliflozin (JARDIANCE) 25 MG TABS tablet Take 25 mg by mouth in the morning.   ENTRESTO 97-103 MG Take 1 tablet by mouth 2 (two) times daily.   Eszopiclone 3 MG TABS Take 3 mg by mouth at bedtime.   flecainide (TAMBOCOR) 100 MG tablet Take 1 tablet (100 mg total) by mouth 2 (two) times daily.   fluticasone (FLONASE) 50 MCG/ACT nasal spray Place 2 sprays into both nostrils daily as needed for allergies or rhinitis.   furosemide (LASIX) 40 MG tablet Take 40 mg by mouth daily as needed for fluid.   insulin lispro (HUMALOG) 100 UNIT/ML injection Inject 0-10 Units into the skin 3  (three) times daily as needed for high blood sugar. Sliding Scale   metoprolol succinate (TOPROL-XL) 100 MG 24 hr tablet Take 100 mg by mouth at bedtime. Patient taking 50 mg in am and 50 mg in PM per PCP   montelukast (SINGULAIR) 10 MG tablet Take 10 mg by mouth at bedtime.   Omega-3 Fatty Acids (FISH OIL) 1200 MG CAPS Take 1,200 mg by mouth 2 (two) times daily.    omeprazole (PRILOSEC) 40 MG capsule Take 40 mg by mouth 2 (two) times daily.    rOPINIRole (REQUIP) 4 MG tablet Take 4 mg by mouth daily.   tamsulosin (FLOMAX) 0.4 MG CAPS capsule Take 0.4 capsules by mouth 2 (two) times daily.   TOUJEO MAX SOLOSTAR 300 UNIT/ML Solostar Pen Inject 98 Units into the skin at bedtime.   venlafaxine XR (EFFEXOR-XR) 150 MG 24 hr capsule Take 150 mg by mouth in the morning.   Vitamin D, Ergocalciferol, (DRISDOL) 1.25 MG (50000 UT) CAPS capsule Take 50,000 Units by mouth every Tuesday. IN THE MORNING     Allergies:   Sitagliptin, Prednisone, Victoza [liraglutide], and Sulfa antibiotics   Social History   Socioeconomic History   Marital status: Married    Spouse name: Not on file   Number of children: Not on file   Years of education: Not on file   Highest education level: Not on file  Occupational History   Not on file  Tobacco Use   Smoking status: Never   Smokeless tobacco: Never  Vaping Use   Vaping Use: Never used  Substance and Sexual Activity   Alcohol use: Not Currently   Drug use: Never   Sexual activity: Not on file  Other Topics Concern   Not on file  Social History Narrative   Not on file   Social Determinants of Health   Financial Resource Strain: Low Risk  (07/11/2022)   Overall Financial Resource Strain (CARDIA)    Difficulty of Paying Living Expenses: Not hard at all  Food Insecurity: No Food Insecurity (07/11/2022)   Hunger Vital Sign    Worried About Running Out of Food in the Last Year: Never true    [place] in the Last Year: Never true  Transportation  Needs: No Transportation Needs (07/11/2022)   PRAPARE - Hydrologist (Medical): No    Lack of Transportation (Non-Medical): No  Physical Activity: Not on file  Stress: Not on file  Social Connections: Not on file     Family History: The patient's family history includes Colon  cancer in his father; Hyperlipidemia in his father; Hypertension in his father; Liver cancer in his father; Lung cancer in his father.  ROS:   Please see the history of present illness.    All other systems reviewed and are negative.  EKGs/Labs/Other Studies Reviewed:    The following studies were reviewed today: EKG reveals sinus rhythm and nonspecific ST-T changes.  QRS was unremarkable.   Recent Labs: 08/07/2022: ALT 45; BUN 18; Creatinine, Ser 0.90; Hemoglobin 15.0; Platelets 185; Potassium 4.0; Sodium 139  Recent Lipid Panel    Component Value Date/Time   CHOL 135 10/09/2019 0820   TRIG 159 (H) 10/09/2019 0820   HDL 47 10/09/2019 0820   CHOLHDL 2.9 10/09/2019 0820   LDLCALC 61 10/09/2019 0820    Physical Exam:    VS:  BP (!) 180/82   Pulse 69   Ht '6\' 2"'$  (1.88 m)   Wt (!) 307 lb 3.2 oz (139.3 kg)   SpO2 94%   BMI 39.44 kg/m     Wt Readings from Last 3 Encounters:  10/23/22 (!) 307 lb 3.2 oz (139.3 kg)  08/07/22 (!) 310 lb (140.6 kg)  02/05/22 (!) 313 lb 12.8 oz (142.3 kg)     GEN: Patient is in no acute distress HEENT: Normal NECK: No JVD; No carotid bruits LYMPHATICS: No lymphadenopathy CARDIAC: Hear sounds regular, 2/6 systolic murmur at the apex. RESPIRATORY:  Clear to auscultation without rales, wheezing or rhonchi  ABDOMEN: Soft, non-tender, non-distended MUSCULOSKELETAL:  No edema; No deformity  SKIN: Warm and dry NEUROLOGIC:  Alert and oriented x 3 PSYCHIATRIC:  Normal affect   Signed, Sean Lindau, MD  10/23/2022 2:39 PM    [place]

## 2022-10-23 NOTE — Patient Instructions (Signed)
Medication Instructions:  Your physician has recommended you make the following change in your medication:   Start Spironolactone 25 mg daily.  Continue your Toprol XL 50 mg twice daily.  Keep a BP/HR log and return in 2 weeks for nurse visit, review log and BMET.  *If you need a refill on your cardiac medications before your next appointment, please call your pharmacy*   Lab Work: Your physician has recommended you have a BMET today in the office.  If you have labs (blood work) drawn today and your tests are completely normal, you will receive your results only by: [place] (if you have MyChart) OR A paper copy in the mail If you have any lab test that is abnormal or we need to change your treatment, we will call you to review the results.   Testing/Procedures: Your physician has requested that you have a renal artery duplex. During this test, an ultrasound is used to evaluate blood flow to the kidneys. Allow one hour for this exam. Do not eat after midnight the day before and avoid carbonated beverages. Take your medications as you usually do.    Follow-Up: At [facility], you and your health needs are our priority.  As part of our continuing mission to provide you with exceptional heart care, we have created designated Provider Care Teams.  These Care Teams include your primary Cardiologist (physician) and Advanced Practice Providers (APPs -  Physician Assistants and Nurse Practitioners) who all work together to provide you with the care you need, when you need it.  We recommend signing up for the patient portal called "MyChart".  Sign up information is provided on this After Visit Summary.  MyChart is used to connect with patients for Virtual Visits (Telemedicine).  Patients are able to view lab/test results, encounter notes, upcoming appointments, etc.  Non-urgent messages can be sent to your provider as well.   To learn more about what you can do with MyChart, go  to [email or web address].    Your next appointment:   1 month(s)  The format for your next appointment:   In Person  Provider:   Jyl Heinz, MD    Other Instructions none  Important Information About Sugar

## 2022-10-24 LAB — BASIC METABOLIC PANEL
BUN/Creatinine Ratio: 16 (ref 10–24)
BUN: 15 mg/dL (ref 8–27)
CO2: 22 mmol/L (ref 20–29)
Calcium: 9.3 mg/dL (ref 8.6–10.2)
Chloride: 103 mmol/L (ref 96–106)
Creatinine, Ser: 0.94 mg/dL (ref 0.76–1.27)
Glucose: 198 mg/dL — ABNORMAL HIGH (ref 70–99)
Potassium: 4.1 mmol/L (ref 3.5–5.2)
Sodium: 140 mmol/L (ref 134–144)
eGFR: 86 mL/min/{1.73_m2} (ref >59)

## 2022-10-31 ENCOUNTER — Ambulatory Visit: Payer: Medicare Other | Attending: Cardiology

## 2022-10-31 DIAGNOSIS — I10 Essential (primary) hypertension: Secondary | ICD-10-CM | POA: Diagnosis not present

## 2022-11-06 ENCOUNTER — Ambulatory Visit: Payer: Medicare Other | Attending: Cardiology

## 2022-11-06 VITALS — BP 128/72 | HR 84 | Wt 304.0 lb

## 2022-11-06 DIAGNOSIS — I429 Cardiomyopathy, unspecified: Secondary | ICD-10-CM | POA: Diagnosis not present

## 2022-11-06 NOTE — Progress Notes (Signed)
   Nurse Visit   Date of Encounter: 11/06/2022 ID: Sean [name]., DOB 12-Nov-1950, MRN [id number]  PCP:  [name], [place] [facility] Cardiologist:  Will Meredith Leeds, MD Electrophysiologist:  [name], MD      Visit Details   VS:  BP 128/72 (BP Location: Right Arm, Patient Position: Sitting, Cuff Size: Normal)   Pulse 84   Wt (!) 304 lb (137.9 kg)   BMI 39.03 kg/m  , BMI Body mass index is 39.03 kg/m.  Wt Readings from Last 3 Encounters:  11/06/22 (!) 304 lb (137.9 kg)  10/23/22 (!) 307 lb 3.2 oz (139.3 kg)  08/07/22 (!) 310 lb (140.6 kg)     Reason for visit: Check Vital sign log and have lab draw Performed today: Vitals, Provider consulted and Education Changes (medications, testing, etc.) : No new orders at this time Length of Visit: 25 minutes    Medications Adjustments/Labs and Tests Ordered: Orders Placed This Encounter  Procedures   Basic Metabolic Panel (BMET)   No orders of the defined types were placed in this encounter.    Signed, Louie Casa, RN  11/06/2022 9:07 AM

## 2022-11-07 LAB — BASIC METABOLIC PANEL
BUN/Creatinine Ratio: 15 (ref 10–24)
BUN: 15 mg/dL (ref 8–27)
CO2: 23 mmol/L (ref 20–29)
Calcium: 9.2 mg/dL (ref 8.6–10.2)
Chloride: 107 mmol/L — ABNORMAL HIGH (ref 96–106)
Creatinine, Ser: 1.02 mg/dL (ref 0.76–1.27)
Glucose: 194 mg/dL — ABNORMAL HIGH (ref 70–99)
Potassium: 4.1 mmol/L (ref 3.5–5.2)
Sodium: 143 mmol/L (ref 134–144)
eGFR: 78 mL/min/{1.73_m2} (ref >59)

## 2022-11-13 ENCOUNTER — Other Ambulatory Visit: Payer: Self-pay

## 2022-11-19 ENCOUNTER — Ambulatory Visit (INDEPENDENT_AMBULATORY_CARE_PROVIDER_SITE_OTHER): Payer: Medicare Other | Admitting: Cardiology

## 2022-11-19 ENCOUNTER — Encounter: Payer: Self-pay | Admitting: Cardiology

## 2022-11-19 ENCOUNTER — Ambulatory Visit: Payer: Medicare Other | Attending: Cardiology | Admitting: Cardiology

## 2022-11-19 VITALS — BP 120/78 | HR 63 | Ht 74.0 in | Wt 304.4 lb

## 2022-11-19 DIAGNOSIS — I480 Paroxysmal atrial fibrillation: Secondary | ICD-10-CM | POA: Insufficient documentation

## 2022-11-19 DIAGNOSIS — Z9889 Other specified postprocedural states: Secondary | ICD-10-CM

## 2022-11-19 DIAGNOSIS — Z79899 Other long term (current) drug therapy: Secondary | ICD-10-CM

## 2022-11-19 DIAGNOSIS — L02212 Cutaneous abscess of back [any part, except buttock]: Secondary | ICD-10-CM | POA: Diagnosis not present

## 2022-11-19 DIAGNOSIS — Z86711 Personal history of pulmonary embolism: Secondary | ICD-10-CM

## 2022-11-19 DIAGNOSIS — E782 Mixed hyperlipidemia: Secondary | ICD-10-CM | POA: Insufficient documentation

## 2022-11-19 DIAGNOSIS — Z6841 Body Mass Index (BMI) 40.0 and over, adult: Secondary | ICD-10-CM | POA: Diagnosis not present

## 2022-11-19 DIAGNOSIS — E088 Diabetes mellitus due to underlying condition with unspecified complications: Secondary | ICD-10-CM | POA: Insufficient documentation

## 2022-11-19 DIAGNOSIS — D6869 Other thrombophilia: Secondary | ICD-10-CM

## 2022-11-19 DIAGNOSIS — G4700 Insomnia, unspecified: Secondary | ICD-10-CM | POA: Diagnosis not present

## 2022-11-19 DIAGNOSIS — I10 Essential (primary) hypertension: Secondary | ICD-10-CM

## 2022-11-19 DIAGNOSIS — I4891 Unspecified atrial fibrillation: Secondary | ICD-10-CM | POA: Diagnosis not present

## 2022-11-19 DIAGNOSIS — I484 Atypical atrial flutter: Secondary | ICD-10-CM | POA: Diagnosis not present

## 2022-11-19 DIAGNOSIS — I4819 Other persistent atrial fibrillation: Secondary | ICD-10-CM | POA: Diagnosis not present

## 2022-11-19 DIAGNOSIS — Z8679 Personal history of other diseases of the circulatory system: Secondary | ICD-10-CM | POA: Diagnosis not present

## 2022-11-19 NOTE — Patient Instructions (Signed)

## 2022-11-19 NOTE — Progress Notes (Signed)
Electrophysiology Office Note   Date:  11/19/2022   ID:  Sean [name]., DOB 1951-08-10, MRN [id number]  PCP:  [name], MD  Cardiologist:  [name] Primary Electrophysiologist:  [name] Meredith Leeds, MD    Chief Complaint: AF   History of Present Illness: Sean [name]. is a 72 y.o. male who is being seen today for the evaluation of AF at the request of [name], MD. Presenting today for electrophysiology evaluation.  He has a history stated for hypertension, hyperlipidemia, CHF, atrial fibrillation.  He had attempted cardioversion on 11/05/2019 but did not go back into normal rhythm.  He continued to have episodes of fatigue with an ejection fraction of 35 to 40%.  He is post atrial fibrillation ablation 11/24/2019.  Ejection fraction normalized after ablation.  He went into atypical atrial flutter and had ablation 07/19/2021.  He presented to cardiology clinic in atrial fibrillation, though his pulmonary veins were isolated.  He has been started on flecainide.  Today, denies symptoms of palpitations, chest pain, shortness of breath, orthopnea, PND, lower extremity edema, claudication, dizziness, presyncope, syncope, bleeding, or neurologic sequela. The patient is tolerating medications without difficulties.  He continues to do well.  He previously was having elevated blood pressures.  He saw his primary cardiologist and had medications adjusted.  Blood pressure is no longer are elevated.  He has noted no further arrhythmias.    Past Medical History:  Diagnosis Date   Abdominal bruit 10/23/2022   Allergic rhinitis    Arrhythmia    Atrial fibrillation (HCC)    Atrial flutter ([place])    Barrett's esophagus 08/03/2020   Benign prostatic hyperplasia with urinary obstruction 11/29/2018   Cardiomyopathy ([place]) 02/23/2020   CHF (congestive heart failure) (HCC)    Closed fracture of fifth metacarpal bone of left hand 08/11/2022   Diabetes mellitus due to underlying condition with  unspecified complications ([place]) 78/29/5621   Diabetes mellitus without complication (HCC)    DOE (dyspnea on exertion) 10/29/2019   S/p PE  11/2018 > DOAC recurrent 09/14/2019 off DOAC so resumed  - onset variable doe and orthostatic lightheadedness 06/2019 with afib - PFT's [place]  10/08/2019 :  FEV1 2.79 (73%) with ratio 75 and 6 % better p saba,  Air trapping on lung vol,  ERV 14% and dlco 22.78 (60%) corrects to 3.68 (76%) for vol with minimal concavity to f/v loop  - 10/29/2019   Walked RA x two laps =  approx 538f @ avg   Essential hypertension 09/29/2019   Fissure in skin of foot 12/11/2016   GERD (gastroesophageal reflux disease) 11/29/2018   Hemospermia 02/22/2020   History of colonic polyps 08/03/2020   History of pulmonary embolism 09/29/2019   Hyperlipidemia    Hypertension    Hypogonadism in male 02/22/2020   IBS (irritable bowel syndrome) 11/29/2018   Impotence 02/22/2020   Incomplete emptying of bladder 02/22/2020   Melena 08/03/2020   Mixed dyslipidemia 09/29/2019   Morbid obesity ([facility] 130/86/5784  Nonalcoholic fatty liver disease 08/03/2020   Open fracture of shaft of metacarpal bone 08/11/2022   OSA (obstructive sleep apnea)    Overgrown toenails 12/11/2016   PAF (paroxysmal atrial fibrillation) ([facility] 02/23/2020   Pain of left hand 08/09/2022   Persistent atrial fibrillation [place] 09/29/2019   Plantar fasciitis, bilateral 06/05/2016   S/P ablation of atrial fibrillation 02/23/2020   Secondary hypercoagulable state ([place] 01/22/2020   Submucosal lesion of stomach 08/03/2020   Upper airway cough syndrome 12/15/2019  Noted on exam 12/15/2019 onset while on Entresto, resolved with purse lip   Past Surgical History:  Procedure Laterality Date   AMPUTATION Left 08/07/2022   Procedure: AMPUTATION FIFTH DIGIT;  Surgeon: Orene Desanctis, MD;  Location: [facility];  Service: Orthopedics;  Laterality: Left;   ATRIAL FIBRILLATION ABLATION N/A 12/25/2019   Procedure: ATRIAL  FIBRILLATION ABLATION;  Surgeon: [name], MD;  Location: [facility];  Service: Cardiovascular;  Laterality: N/A;   ATRIAL FIBRILLATION ABLATION N/A 07/19/2021   Procedure: ATRIAL FIBRILLATION ABLATION;  Surgeon: [name], MD;  Location: [facility];  Service: Cardiovascular;  Laterality: N/A;   BIOPSY  08/11/2020   Procedure: BIOPSY;  Surgeon: [name], MD;  Location: [facility];  Service: Endoscopy;;   CARDIAC CATHETERIZATION     CARDIOVERSION N/A 04/17/2021   Procedure: CARDIOVERSION;  Surgeon: [name], MD;  Location: [facility];  Service: Cardiovascular;  Laterality: N/A;   ESOPHAGOGASTRODUODENOSCOPY (EGD) WITH PROPOFOL N/A 08/11/2020   Procedure: ESOPHAGOGASTRODUODENOSCOPY (EGD) WITH PROPOFOL;  Surgeon: [name], MD;  Location: [facility];  Service: Endoscopy;  Laterality: N/A;   I & D EXTREMITY Left 08/07/2022   Procedure: IRRIGATION AND DEBRIDEMENT;  Surgeon: Orene Desanctis, MD;  Location: [facility];  Service: Orthopedics;  Laterality: Left;   KNEE SURGERY     PILONIDAL CYST / SINUS EXCISION     UPPER ESOPHAGEAL ENDOSCOPIC ULTRASOUND (EUS) N/A 08/11/2020   Procedure: UPPER ESOPHAGEAL ENDOSCOPIC ULTRASOUND (EUS);  Surgeon: [name], MD;  Location: [facility];  Service: Endoscopy;  Laterality: N/A;     Current Outpatient Medications  Medication Sig Dispense Refill   albuterol (VENTOLIN HFA) 108 (90 Base) MCG/ACT inhaler Inhale 2 puffs into the lungs every 6 (six) hours as needed for wheezing or shortness of breath.      allopurinol (ZYLOPRIM) 100 MG tablet Take 100 mg by mouth in the morning.     amLODipine (NORVASC) 10 MG tablet Take 10 mg by mouth daily.     Ascorbic Acid (VITAMIN C) 1000 MG tablet Take 1,000 mg by mouth 2 (two) times daily.     atorvastatin (LIPITOR) 20 MG tablet Take 20 mg by mouth daily.     Cholecalciferol (VITAMIN D3) 50 MCG (2000 UT) TABS Take 2,000 Units by mouth 2 (two) times daily.       doxycycline (VIBRAMYCIN) 100 MG capsule Take 100 mg by mouth daily.     ELIQUIS 5 MG TABS tablet Take 1 tablet (5 mg total) by mouth 2 (two) times daily. 180 tablet 1   empagliflozin (JARDIANCE) 25 MG TABS tablet Take 25 mg by mouth in the morning.     ENTRESTO 97-103 MG Take 1 tablet by mouth 2 (two) times daily.     Eszopiclone 3 MG TABS Take 3 mg by mouth at bedtime.     finasteride (PROSCAR) 5 MG tablet Take 5 mg by mouth daily.     flecainide (TAMBOCOR) 100 MG tablet Take 1 tablet (100 mg total) by mouth 2 (two) times daily. 60 tablet 10   fluticasone (FLONASE) 50 MCG/ACT nasal spray Place 2 sprays into both nostrils daily as needed for allergies or rhinitis.     furosemide (LASIX) 40 MG tablet Take 40 mg by mouth daily as needed for fluid.     insulin lispro (HUMALOG) 100 UNIT/ML injection Inject 0-10 Units into the skin 3 (three) times daily as needed for high blood sugar. Sliding Scale     metoprolol succinate (  TOPROL-XL) 50 MG 24 hr tablet Take 1 tablet (50 mg total) by mouth 2 (two) times daily. Take with or immediately following a meal. 90 tablet 2   montelukast (SINGULAIR) 10 MG tablet Take 10 mg by mouth at bedtime.     Omega-3 Fatty Acids (FISH OIL) 1200 MG CAPS Take 1,200 mg by mouth 2 (two) times daily.      omeprazole (PRILOSEC) 40 MG capsule Take 40 mg by mouth 2 (two) times daily.      rOPINIRole (REQUIP) 4 MG tablet Take 4 mg by mouth daily.     spironolactone (ALDACTONE) 25 MG tablet Take 1 tablet (25 mg total) by mouth daily. 90 tablet 3   tamsulosin (FLOMAX) 0.4 MG CAPS capsule Take 0.4 capsules by mouth 2 (two) times daily.     TOUJEO MAX SOLOSTAR 300 UNIT/ML Solostar Pen Inject 98 Units into the skin at bedtime.     venlafaxine XR (EFFEXOR-XR) 150 MG 24 hr capsule Take 150 mg by mouth in the morning.     Vitamin D, Ergocalciferol, (DRISDOL) 1.25 MG (50000 UT) CAPS capsule Take 50,000 Units by mouth every Tuesday. IN THE MORNING     No current facility-administered  medications for this visit.    Allergies:   Sitagliptin, Prednisone, Victoza [liraglutide], and Sulfa antibiotics   Social History:  The patient  reports that he has never smoked. He has never used smokeless tobacco. He reports that he does not currently use alcohol. He reports that he does not use drugs.   Family History:  The patient's family history includes Colon cancer in his father; Hyperlipidemia in his father; Hypertension in his father; Liver cancer in his father; Lung cancer in his father.   ROS:  Please see the history of present illness.   Otherwise, review of systems is positive for none.   All other systems are reviewed and negative.   PHYSICAL EXAM: VS:  BP 120/78   Pulse 63   Ht '6\' 2"'$  (1.88 m)   Wt (!) 304 lb 6.4 oz (138.1 kg)   SpO2 95%   BMI 39.08 kg/m  , BMI Body mass index is 39.08 kg/m. GEN: Well nourished, well developed, in no acute distress  HEENT: normal  Neck: no JVD, carotid bruits, or masses Cardiac: RRR; no murmurs, rubs, or gallops,no edema  Respiratory:  clear to auscultation bilaterally, normal work of breathing GI: soft, nontender, nondistended, + BS MS: no deformity or atrophy  Skin: warm and dry Neuro:  Strength and sensation are intact Psych: euthymic mood, full affect  EKG:  EKG is ordered today. Personal review of the ekg ordered shows sinus rhythm, 1dAVB   Recent Labs: 08/07/2022: ALT 45; Hemoglobin 15.0; Platelets 185 11/06/2022: BUN 15; Creatinine, Ser 1.02; Potassium 4.1; Sodium 143    Lipid Panel     Component Value Date/Time   CHOL 135 10/09/2019 0820   TRIG 159 (H) 10/09/2019 0820   HDL 47 10/09/2019 0820   CHOLHDL 2.9 10/09/2019 0820   LDLCALC 61 10/09/2019 0820     Wt Readings from Last 3 Encounters:  11/19/22 (!) 304 lb 6.4 oz (138.1 kg)  11/06/22 (!) 304 lb (137.9 kg)  10/23/22 (!) 307 lb 3.2 oz (139.3 kg)      Other studies Reviewed: Additional studies/ records that were reviewed today include: TTE  03/11/2020 Review of the above records today demonstrates:   1. Left ventricular ejection fraction, by estimation, is 60 to 65%. The  left ventricle has normal function.  The left ventricle has no regional  wall motion abnormalities. Left ventricular diastolic parameters are  consistent with Grade I diastolic  dysfunction (impaired relaxation).   ASSESSMENT AND PLAN:  1.  Persistent atrial fibrillation/flutter: Status post ablation 12/25/2019 with repeat ablation 07/19/2021.  Currently on Eliquis 5 mg twice daily, flecainide 100 mg twice daily.  CHA2DS2-VASc of 3.  Recent ablation was for atypical atrial flutter but he did go into atrial fibrillation with isolated pulmonary veins.  He was started on flecainide at that time.  No further episodes of atrial fibrillation or atrial flutter.  Currently feeling well.  No changes.  2.  Chronic systolic heart failure: Due to nonischemic cardiomyopathy.  Currently on Entresto and metoprolol.  Ejection fraction since normalized.  No volume overload.  3.  Hypertension: Blood pressure is elevated at home.  4.  Secondary hypercoagulable state: Currently on Eliquis for atrial fibrillation as above  5.  High risk medication monitoring: Currently on flecainide for atrial fibrillation as above.  QRS remains narrow.  Has a long first-degree AV block.  Vinod Mikesell continue to monitor with ECG sitting to visit.  Current medicines are reviewed at length with the patient today.   The patient does not have concerns regarding his medicines.  The following changes were made today: none  Labs/ tests ordered today include:  No orders of the defined types were placed in this encounter.     Disposition:   FU 6 months  Signed, Jari Dipasquale Meredith Leeds, MD  11/19/2022 10:09 AM     [facility] [address] [place] [place] [address] [phone] (office) [phone] (fax)

## 2022-11-19 NOTE — Patient Instructions (Signed)
Medication Instructions:  Your physician recommends that you continue on your current medications as directed. Please refer to the Current Medication list given to you today.  *If you need a refill on your cardiac medications before your next appointment, please call your pharmacy*   Lab Work: None ordered If you have labs (blood work) drawn today and your tests are completely normal, you will receive your results only by: [place] (if you have MyChart) OR A paper copy in the mail If you have any lab test that is abnormal or we need to change your treatment, we will call you to review the results.   Testing/Procedures: None ordered   Follow-Up: At [facility], you and your health needs are our priority.  As part of our continuing mission to provide you with exceptional heart care, we have created designated Provider Care Teams.  These Care Teams include your primary Cardiologist (physician) and Advanced Practice Providers (APPs -  Physician Assistants and Nurse Practitioners) who all work together to provide you with the care you need, when you need it.  We recommend signing up for the patient portal called "MyChart".  Sign up information is provided on this After Visit Summary.  MyChart is used to connect with patients for Virtual Visits (Telemedicine).  Patients are able to view lab/test results, encounter notes, upcoming appointments, etc.  Non-urgent messages can be sent to your provider as well.   To learn more about what you can do with MyChart, go to [email or web address].    Your next appointment:   6 month(s)  Provider:   Dr. Curt Bears

## 2022-11-19 NOTE — Progress Notes (Signed)
Cardiology Office Note:    Date:  11/19/2022   ID:  Sean Prude., DOB Dec 26, 1950, MRN [id number]  PCP:  [name], MD  Cardiologist:  Jenean Lindau, MD   Referring MD: [name], MD    ASSESSMENT:    1. PAF (paroxysmal atrial fibrillation) ([place])   2. S/P ablation of atrial fibrillation   3. Morbid obesity ([place])   4. Mixed dyslipidemia   5. History of pulmonary embolism   6. Diabetes mellitus due to underlying condition with unspecified complications ([place])    PLAN:    In order of problems listed above:  Primary prevention stressed with the patient.  Importance of compliance with diet medication stressed and vocalized understanding. Essential hypertension: Blood pressure stable and diet was emphasized.  He is doing well with diet and exercise with his blood pressure has stabilized. Atrial fibrillation post ablation: Now has paroxysmal atrial fibrillation well-controlled and asymptomatic on flecainide.  I discussed this with the patient at length.I discussed with the patient atrial fibrillation, disease process. Management and therapy including rate and rhythm control, anticoagulation benefits and potential risks were discussed extensively with the patient. Patient had multiple questions which were answered to patient's satisfaction.  Patient will be evaluated by Dr. Curt Bears today. Mixed dyslipidemia and diabetes mellitus: Diet was emphasized.  Lifestyle modification urged. Morbid obesity: Weight reduction stressed and diet was emphasized and he promises to do better.Patient will be seen in follow-up appointment in 6 months or earlier if the patient has any concerns    Medication Adjustments/Labs and Tests Ordered: Current medicines are reviewed at length with the patient today.  Concerns regarding medicines are outlined above.  Orders Placed This Encounter  Procedures   EKG 12-Lead   No orders of the defined types were placed in this encounter.    No chief  complaint on file.    History of Present Illness:    Sean Hyams. is a 72 y.o. male.  Patient has past medical history of atrial fibrillation post ablation.  He now has paroxysmal atrial fibrillation, history of cardiomyopathy, essential hypertension, mixed dyslipidemia and diabetes mellitus.  He denies any problems at this time and takes care of activities of daily living.  No chest pain orthopnea or PND.  At the time of my evaluation, the patient is alert awake oriented and in no distress.  Past Medical History:  Diagnosis Date   Abdominal bruit 10/23/2022   Allergic rhinitis    Arrhythmia    Atrial fibrillation (HCC)    Atrial flutter ([place])    Barrett's esophagus 08/03/2020   Benign prostatic hyperplasia with urinary obstruction 11/29/2018   Cardiomyopathy ([place]) 02/23/2020   CHF (congestive heart failure) (HCC)    Closed fracture of fifth metacarpal bone of left hand 08/11/2022   Diabetes mellitus due to underlying condition with unspecified complications ([place]) 30/86/5784   Diabetes mellitus without complication (HCC)    DOE (dyspnea on exertion) 10/29/2019   S/p PE  11/2018 > DOAC recurrent 09/14/2019 off DOAC so resumed  - onset variable doe and orthostatic lightheadedness 06/2019 with afib - PFT's [place]  10/08/2019 :  FEV1 2.79 (73%) with ratio 75 and 6 % better p saba,  Air trapping on lung vol,  ERV 14% and dlco 22.78 (60%) corrects to 3.68 (76%) for vol with minimal concavity to f/v loop  - 10/29/2019   Walked RA x two laps =  approx 565f @ avg   Essential hypertension 09/29/2019   Fissure in skin of  foot 12/11/2016   GERD (gastroesophageal reflux disease) 11/29/2018   Hemospermia 02/22/2020   History of colonic polyps 08/03/2020   History of pulmonary embolism 09/29/2019   Hyperlipidemia    Hypertension    Hypogonadism in male 02/22/2020   IBS (irritable bowel syndrome) 11/29/2018   Impotence 02/22/2020   Incomplete emptying of bladder 02/22/2020   Melena  08/03/2020   Mixed dyslipidemia 09/29/2019   Morbid obesity ([place]) 87/86/7672   Nonalcoholic fatty liver disease 08/03/2020   Open fracture of shaft of metacarpal bone 08/11/2022   OSA (obstructive sleep apnea)    Overgrown toenails 12/11/2016   PAF (paroxysmal atrial fibrillation) ([place]) 02/23/2020   Pain of left hand 08/09/2022   Persistent atrial fibrillation ([place]) 09/29/2019   Plantar fasciitis, bilateral 06/05/2016   S/P ablation of atrial fibrillation 02/23/2020   Secondary hypercoagulable state ([place]) 01/22/2020   Submucosal lesion of stomach 08/03/2020   Upper airway cough syndrome 12/15/2019   Noted on exam 12/15/2019 onset while on Entresto, resolved with purse lip    Past Surgical History:  Procedure Laterality Date   AMPUTATION Left 08/07/2022   Procedure: AMPUTATION FIFTH DIGIT;  Surgeon: Orene Desanctis, MD;  Location: [facility];  Service: Orthopedics;  Laterality: Left;   ATRIAL FIBRILLATION ABLATION N/A 12/25/2019   Procedure: ATRIAL FIBRILLATION ABLATION;  Surgeon: [name], MD;  Location: [facility];  Service: Cardiovascular;  Laterality: N/A;   ATRIAL FIBRILLATION ABLATION N/A 07/19/2021   Procedure: ATRIAL FIBRILLATION ABLATION;  Surgeon: [name], MD;  Location: [facility];  Service: Cardiovascular;  Laterality: N/A;   BIOPSY  08/11/2020   Procedure: BIOPSY;  Surgeon: [name], MD;  Location: [facility];  Service: Endoscopy;;   CARDIAC CATHETERIZATION     CARDIOVERSION N/A 04/17/2021   Procedure: CARDIOVERSION;  Surgeon: [name], MD;  Location: [facility];  Service: Cardiovascular;  Laterality: N/A;   ESOPHAGOGASTRODUODENOSCOPY (EGD) WITH PROPOFOL N/A 08/11/2020   Procedure: ESOPHAGOGASTRODUODENOSCOPY (EGD) WITH PROPOFOL;  Surgeon: [name], MD;  Location: [facility];  Service: Endoscopy;  Laterality: N/A;   I & D EXTREMITY Left 08/07/2022   Procedure: IRRIGATION AND DEBRIDEMENT;  Surgeon: Orene Desanctis, MD;   Location: [facility];  Service: Orthopedics;  Laterality: Left;   KNEE SURGERY     PILONIDAL CYST / SINUS EXCISION     UPPER ESOPHAGEAL ENDOSCOPIC ULTRASOUND (EUS) N/A 08/11/2020   Procedure: UPPER ESOPHAGEAL ENDOSCOPIC ULTRASOUND (EUS);  Surgeon: [name], MD;  Location: [facility];  Service: Endoscopy;  Laterality: N/A;    Current Medications: Current Meds  Medication Sig   albuterol (VENTOLIN HFA) 108 (90 Base) MCG/ACT inhaler Inhale 2 puffs into the lungs every 6 (six) hours as needed for wheezing or shortness of breath.    allopurinol (ZYLOPRIM) 100 MG tablet Take 100 mg by mouth in the morning.   amLODipine (NORVASC) 10 MG tablet Take 10 mg by mouth daily.   Ascorbic Acid (VITAMIN C) 1000 MG tablet Take 1,000 mg by mouth 2 (two) times daily.   atorvastatin (LIPITOR) 20 MG tablet Take 20 mg by mouth daily.   Cholecalciferol (VITAMIN D3) 50 MCG (2000 UT) TABS Take 2,000 Units by mouth 2 (two) times daily.    doxycycline (VIBRAMYCIN) 100 MG capsule Take 100 mg by mouth daily.   ELIQUIS 5 MG TABS tablet Take 1 tablet (5 mg total) by mouth 2 (two) times daily.   empagliflozin (JARDIANCE) 25 MG TABS tablet Take 25 mg by mouth in the morning.  ENTRESTO 97-103 MG Take 1 tablet by mouth 2 (two) times daily.   Eszopiclone 3 MG TABS Take 3 mg by mouth at bedtime.   finasteride (PROSCAR) 5 MG tablet Take 5 mg by mouth daily.   flecainide (TAMBOCOR) 100 MG tablet Take 1 tablet (100 mg total) by mouth 2 (two) times daily.   fluticasone (FLONASE) 50 MCG/ACT nasal spray Place 2 sprays into both nostrils daily as needed for allergies or rhinitis.   furosemide (LASIX) 40 MG tablet Take 40 mg by mouth daily as needed for fluid.   insulin lispro (HUMALOG) 100 UNIT/ML injection Inject 0-10 Units into the skin 3 (three) times daily as needed for high blood sugar. Sliding Scale   metoprolol succinate (TOPROL-XL) 50 MG 24 hr tablet Take 1 tablet (50 mg total) by mouth 2 (two) times daily. Take with or  immediately following a meal.   montelukast (SINGULAIR) 10 MG tablet Take 10 mg by mouth at bedtime.   Omega-3 Fatty Acids (FISH OIL) 1200 MG CAPS Take 1,200 mg by mouth 2 (two) times daily.    omeprazole (PRILOSEC) 40 MG capsule Take 40 mg by mouth 2 (two) times daily.    rOPINIRole (REQUIP) 4 MG tablet Take 4 mg by mouth daily.   spironolactone (ALDACTONE) 25 MG tablet Take 1 tablet (25 mg total) by mouth daily.   tamsulosin (FLOMAX) 0.4 MG CAPS capsule Take 0.4 capsules by mouth 2 (two) times daily.   TOUJEO MAX SOLOSTAR 300 UNIT/ML Solostar Pen Inject 98 Units into the skin at bedtime.   venlafaxine XR (EFFEXOR-XR) 150 MG 24 hr capsule Take 150 mg by mouth in the morning.   Vitamin D, Ergocalciferol, (DRISDOL) 1.25 MG (50000 UT) CAPS capsule Take 50,000 Units by mouth every Tuesday. IN THE MORNING     Allergies:   Sitagliptin, Prednisone, Victoza [liraglutide], and Sulfa antibiotics   Social History   Socioeconomic History   Marital status: Married    Spouse name: Not on file   Number of children: Not on file   Years of education: Not on file   Highest education level: Not on file  Occupational History   Not on file  Tobacco Use   Smoking status: Never   Smokeless tobacco: Never  Vaping Use   Vaping Use: Never used  Substance and Sexual Activity   Alcohol use: Not Currently   Drug use: Never   Sexual activity: Not on file  Other Topics Concern   Not on file  Social History Narrative   Not on file   Social Determinants of Health   Financial Resource Strain: Low Risk  (07/11/2022)   Overall Financial Resource Strain (CARDIA)    Difficulty of Paying Living Expenses: Not hard at all  Food Insecurity: No Food Insecurity (07/11/2022)   Hunger Vital Sign    Worried About Running Out of Food in the Last Year: Never true    Albers in the Last Year: Never true  Transportation Needs: No Transportation Needs (07/11/2022)   PRAPARE - Radiographer, therapeutic (Medical): No    Lack of Transportation (Non-Medical): No  Physical Activity: Not on file  Stress: Not on file  Social Connections: Not on file     Family History: The patient's family history includes Colon cancer in his father; Hyperlipidemia in his father; Hypertension in his father; Liver cancer in his father; Lung cancer in his father.  ROS:   Please see the history of present illness.  All other systems reviewed and are negative.  EKGs/Labs/Other Studies Reviewed:    The following studies were reviewed today: EKG reveals sinus rhythm first-degree AV block and nonspecific ST changes   Recent Labs: 08/07/2022: ALT 45; Hemoglobin 15.0; Platelets 185 11/06/2022: BUN 15; Creatinine, Ser 1.02; Potassium 4.1; Sodium 143  Recent Lipid Panel    Component Value Date/Time   CHOL 135 10/09/2019 0820   TRIG 159 (H) 10/09/2019 0820   HDL 47 10/09/2019 0820   CHOLHDL 2.9 10/09/2019 0820   LDLCALC 61 10/09/2019 0820    Physical Exam:    VS:  BP 120/78   Pulse 63   Ht '6\' 2"'$  (1.88 m)   Wt (!) 304 lb 6.4 oz (138.1 kg)   SpO2 95%   BMI 39.08 kg/m     Wt Readings from Last 3 Encounters:  11/19/22 (!) 304 lb 6.4 oz (138.1 kg)  11/06/22 (!) 304 lb (137.9 kg)  10/23/22 (!) 307 lb 3.2 oz (139.3 kg)     GEN: Patient is in no acute distress HEENT: Normal NECK: No JVD; No carotid bruits LYMPHATICS: No lymphadenopathy CARDIAC: Hear sounds regular, 2/6 systolic murmur at the apex. RESPIRATORY:  Clear to auscultation without rales, wheezing or rhonchi  ABDOMEN: Soft, non-tender, non-distended MUSCULOSKELETAL:  No edema; No deformity  SKIN: Warm and dry NEUROLOGIC:  Alert and oriented x 3 PSYCHIATRIC:  Normal affect   Signed, Jenean Lindau, MD  11/19/2022 9:57 AM    [facility]

## 2022-12-10 ENCOUNTER — Other Ambulatory Visit: Payer: Self-pay | Admitting: Cardiology

## 2022-12-12 ENCOUNTER — Other Ambulatory Visit: Payer: Self-pay | Admitting: Cardiology

## 2022-12-12 DIAGNOSIS — I480 Paroxysmal atrial fibrillation: Secondary | ICD-10-CM

## 2022-12-12 NOTE — Telephone Encounter (Signed)
Prescription refill request for Eliquis received. Indication: a fib Last office visit: 11/19/22 Scr: 1.02 11/06/22 Age: 72 Weight: 138kg

## 2022-12-17 DIAGNOSIS — E785 Hyperlipidemia, unspecified: Secondary | ICD-10-CM | POA: Diagnosis not present

## 2022-12-17 DIAGNOSIS — D649 Anemia, unspecified: Secondary | ICD-10-CM | POA: Diagnosis not present

## 2022-12-17 DIAGNOSIS — G4730 Sleep apnea, unspecified: Secondary | ICD-10-CM | POA: Diagnosis not present

## 2022-12-17 DIAGNOSIS — E1121 Type 2 diabetes mellitus with diabetic nephropathy: Secondary | ICD-10-CM | POA: Diagnosis not present

## 2022-12-24 DIAGNOSIS — K259 Gastric ulcer, unspecified as acute or chronic, without hemorrhage or perforation: Secondary | ICD-10-CM | POA: Diagnosis not present

## 2022-12-24 DIAGNOSIS — K219 Gastro-esophageal reflux disease without esophagitis: Secondary | ICD-10-CM | POA: Diagnosis not present

## 2022-12-24 DIAGNOSIS — K2270 Barrett's esophagus without dysplasia: Secondary | ICD-10-CM | POA: Diagnosis not present

## 2022-12-24 DIAGNOSIS — K760 Fatty (change of) liver, not elsewhere classified: Secondary | ICD-10-CM | POA: Diagnosis not present

## 2022-12-31 DIAGNOSIS — K760 Fatty (change of) liver, not elsewhere classified: Secondary | ICD-10-CM | POA: Diagnosis not present

## 2022-12-31 DIAGNOSIS — R935 Abnormal findings on diagnostic imaging of other abdominal regions, including retroperitoneum: Secondary | ICD-10-CM | POA: Diagnosis not present

## 2023-01-15 DIAGNOSIS — G2581 Restless legs syndrome: Secondary | ICD-10-CM | POA: Diagnosis not present

## 2023-01-15 DIAGNOSIS — E559 Vitamin D deficiency, unspecified: Secondary | ICD-10-CM | POA: Diagnosis not present

## 2023-01-15 DIAGNOSIS — E1121 Type 2 diabetes mellitus with diabetic nephropathy: Secondary | ICD-10-CM | POA: Diagnosis not present

## 2023-01-15 DIAGNOSIS — Z6839 Body mass index (BMI) 39.0-39.9, adult: Secondary | ICD-10-CM | POA: Diagnosis not present

## 2023-01-15 DIAGNOSIS — K219 Gastro-esophageal reflux disease without esophagitis: Secondary | ICD-10-CM | POA: Diagnosis not present

## 2023-01-15 DIAGNOSIS — I10 Essential (primary) hypertension: Secondary | ICD-10-CM | POA: Diagnosis not present

## 2023-01-15 DIAGNOSIS — N401 Enlarged prostate with lower urinary tract symptoms: Secondary | ICD-10-CM | POA: Diagnosis not present

## 2023-01-15 DIAGNOSIS — M19011 Primary osteoarthritis, right shoulder: Secondary | ICD-10-CM | POA: Diagnosis not present

## 2023-01-15 DIAGNOSIS — Z139 Encounter for screening, unspecified: Secondary | ICD-10-CM | POA: Diagnosis not present

## 2023-01-15 DIAGNOSIS — Z79899 Other long term (current) drug therapy: Secondary | ICD-10-CM | POA: Diagnosis not present

## 2023-02-13 DIAGNOSIS — G4700 Insomnia, unspecified: Secondary | ICD-10-CM | POA: Diagnosis not present

## 2023-02-13 DIAGNOSIS — I4891 Unspecified atrial fibrillation: Secondary | ICD-10-CM | POA: Diagnosis not present

## 2023-02-13 DIAGNOSIS — E1121 Type 2 diabetes mellitus with diabetic nephropathy: Secondary | ICD-10-CM | POA: Diagnosis not present

## 2023-02-13 DIAGNOSIS — M159 Polyosteoarthritis, unspecified: Secondary | ICD-10-CM | POA: Diagnosis not present

## 2023-02-13 DIAGNOSIS — Z6839 Body mass index (BMI) 39.0-39.9, adult: Secondary | ICD-10-CM | POA: Diagnosis not present

## 2023-03-18 DIAGNOSIS — E1121 Type 2 diabetes mellitus with diabetic nephropathy: Secondary | ICD-10-CM | POA: Diagnosis not present

## 2023-03-18 DIAGNOSIS — G4730 Sleep apnea, unspecified: Secondary | ICD-10-CM | POA: Diagnosis not present

## 2023-03-18 DIAGNOSIS — D649 Anemia, unspecified: Secondary | ICD-10-CM | POA: Diagnosis not present

## 2023-03-18 DIAGNOSIS — E785 Hyperlipidemia, unspecified: Secondary | ICD-10-CM | POA: Diagnosis not present

## 2023-04-17 DIAGNOSIS — E1121 Type 2 diabetes mellitus with diabetic nephropathy: Secondary | ICD-10-CM | POA: Diagnosis not present

## 2023-04-17 DIAGNOSIS — M19011 Primary osteoarthritis, right shoulder: Secondary | ICD-10-CM | POA: Diagnosis not present

## 2023-04-17 DIAGNOSIS — Z6839 Body mass index (BMI) 39.0-39.9, adult: Secondary | ICD-10-CM | POA: Diagnosis not present

## 2023-04-17 DIAGNOSIS — G4700 Insomnia, unspecified: Secondary | ICD-10-CM | POA: Diagnosis not present

## 2023-04-17 DIAGNOSIS — N401 Enlarged prostate with lower urinary tract symptoms: Secondary | ICD-10-CM | POA: Diagnosis not present

## 2023-04-17 DIAGNOSIS — K219 Gastro-esophageal reflux disease without esophagitis: Secondary | ICD-10-CM | POA: Diagnosis not present

## 2023-04-17 DIAGNOSIS — G2581 Restless legs syndrome: Secondary | ICD-10-CM | POA: Diagnosis not present

## 2023-04-17 DIAGNOSIS — I10 Essential (primary) hypertension: Secondary | ICD-10-CM | POA: Diagnosis not present

## 2023-05-15 DIAGNOSIS — G4700 Insomnia, unspecified: Secondary | ICD-10-CM | POA: Diagnosis not present

## 2023-05-15 DIAGNOSIS — Z6839 Body mass index (BMI) 39.0-39.9, adult: Secondary | ICD-10-CM | POA: Diagnosis not present

## 2023-05-15 DIAGNOSIS — E1121 Type 2 diabetes mellitus with diabetic nephropathy: Secondary | ICD-10-CM | POA: Diagnosis not present

## 2023-05-15 DIAGNOSIS — I4891 Unspecified atrial fibrillation: Secondary | ICD-10-CM | POA: Diagnosis not present

## 2023-05-27 ENCOUNTER — Telehealth: Payer: Self-pay | Admitting: *Deleted

## 2023-05-27 ENCOUNTER — Encounter: Payer: Self-pay | Admitting: Cardiology

## 2023-05-27 ENCOUNTER — Ambulatory Visit: Payer: Medicare Other | Attending: Cardiology | Admitting: Cardiology

## 2023-05-27 VITALS — BP 94/60 | HR 57 | Ht 74.0 in | Wt 311.4 lb

## 2023-05-27 DIAGNOSIS — I10 Essential (primary) hypertension: Secondary | ICD-10-CM

## 2023-05-27 DIAGNOSIS — I5022 Chronic systolic (congestive) heart failure: Secondary | ICD-10-CM | POA: Diagnosis not present

## 2023-05-27 DIAGNOSIS — D6869 Other thrombophilia: Secondary | ICD-10-CM

## 2023-05-27 DIAGNOSIS — Z79899 Other long term (current) drug therapy: Secondary | ICD-10-CM | POA: Diagnosis not present

## 2023-05-27 DIAGNOSIS — I4819 Other persistent atrial fibrillation: Secondary | ICD-10-CM | POA: Diagnosis not present

## 2023-05-27 MED ORDER — ENTRESTO 49-51 MG PO TABS: 1.0000 | ORAL_TABLET | Freq: Two times a day (BID) | ORAL | 6 refills | Status: DC

## 2023-05-27 NOTE — Telephone Encounter (Signed)
error 

## 2023-05-27 NOTE — Patient Instructions (Addendum)
Medication Instructions:  Your physician has recommended you make the following change in your medication:  DECREASE Entresto to 49/51 mg twice daily  *If you need a refill on your cardiac medications before your next appointment, please call your pharmacy*   Lab Work: None ordered   Testing/Procedures: None ordered   Follow-Up: At [facility], you and your health needs are our priority.  As part of our continuing mission to provide you with exceptional heart care, we have created designated Provider Care Teams.  These Care Teams include your primary Cardiologist (physician) and Advanced Practice Providers (APPs -  Physician Assistants and Nurse Practitioners) who all work together to provide you with the care you need, when you need it.  Your next appointment:   6 month(s)  The format for your next appointment:   In Person  Provider:   Loman Brooklyn, MD{   Thank you for choosing [facility]!!   Dory Horn, RN [phone]  Other Instructions

## 2023-05-27 NOTE — Progress Notes (Signed)
Electrophysiology Office Note:   Date:  7/29/[name]  ID:  Sean Sean., DOB [birth year], MRN [id number]  Primary Cardiologist: Alylah Blakney Jorja Loa, MD Electrophysiologist: Regan Lemming, MD      History of Present Illness:   Sean [name]. is a 72 y.o. male with h/o atrial fibrillation seen today for routine electrophysiology followup.  Since last being seen in our clinic the patient reports doing well.  He notes that he has been sleepier over the last few weeks.  His blood pressure has been low at home and is low today.  Aside from that he has no complaints.  He does not think that he has had any more atrial fibrillation..  he denies chest pain, palpitations, dyspnea, PND, orthopnea, nausea, vomiting, dizziness, syncope, edema, weight gain, or early satiety.     He has a history stated for hypertension, hyperlipidemia, CHF, atrial fibrillation. He had attempted cardioversion on 11/05/2019 but did not go back into normal rhythm. He continued to have episodes of fatigue with an ejection fraction of 35 to 40%. He is post atrial fibrillation ablation 11/24/2019. Ejection fraction normalized after ablation. He went into atypical atrial flutter and had ablation 07/19/2021. He presented to cardiology clinic in atrial fibrillation, though his pulmonary veins were isolated. He has been started on flecainide.      Review of systems complete and found to be negative unless listed in HPI.   EP Information / Studies Reviewed:    EKG is ordered today. Personal review as below.  EKG Interpretation Date/Time:  Sean [name] 12:01:20 EDT Ventricular Rate:  57 PR Interval:    QRS Duration:  104 QT Interval:  424 QTC Calculation: 412 R Axis:   21  Text Interpretation: Sinus rhythm with First degree A-V block Low voltage QRS Septal infarct , age undetermined Abnormal ECG When compared with ECG of 16-Aug-2021 09:49, No significant change was found Confirmed by Lilyanah Celestin ([id number]) on  7/29/[name] 12:17:12 PM     Risk Assessment/Calculations:    CHA2DS2-VASc Score = 4   This indicates a 4.8% annual risk of stroke. The patient's score is based upon: CHF History: 1 HTN History: 1 Diabetes History: 1 Stroke History: 0 Vascular Disease History: 0 Age Score: 1 Gender Score: 0             Physical Exam:   VS:  BP 94/60   Pulse (!) 57   Ht 6\' 2"  (1.88 m)   Wt (!) 311 lb 6.4 oz (141.3 kg)   SpO2 92%   BMI 39.98 kg/m    Wt Readings from Last 3 Encounters:  05/27/23 (!) 311 lb 6.4 oz (141.3 kg)  11/19/22 (!) 304 lb 6.4 oz (138.1 kg)  11/19/22 (!) 304 lb 6.4 oz (138.1 kg)     GEN: Well nourished, well developed in no acute distress NECK: No JVD; No carotid bruits CARDIAC: Regular rate and rhythm, no murmurs, rubs, gallops RESPIRATORY:  Clear to auscultation without rales, wheezing or rhonchi  ABDOMEN: Soft, non-tender, non-distended EXTREMITIES:  No edema; No deformity   ASSESSMENT AND PLAN:    1.  Persistent atrial fibrillation/flutter: Status post ablation 10/23/2020 with repeat ablation 07/19/2021.  Currently on Eliquis and flecainide.  Had an ablation for atypical atrial flutter but he did go back into atrial fibrillation with isolated pulmonary veins and thus flecainide was started.  No further episodes.  No changes.  2.  Chronic systolic heart failure: Due to nonischemic cardiomyopathy.  Currently on Entresto and  metoprolol.  Ejection fraction is normalized.  3.  Hypertension: Low today.  [name] reduce his Entresto by half.  4.  Secondary hypercoagulable state: Currently on Eliquis for atrial fibrillation  5.  High risk medication monitoring: Currently on flecainide.  QRS remains narrow.  As long first-degree AV block.  [name] continue to monitor.  Follow up with Dr. Elberta Fortis in 6 months  Signed, Damarious Holtsclaw Jorja Loa, MD

## 2023-06-03 DIAGNOSIS — K449 Diaphragmatic hernia without obstruction or gangrene: Secondary | ICD-10-CM | POA: Diagnosis not present

## 2023-06-03 DIAGNOSIS — R32 Unspecified urinary incontinence: Secondary | ICD-10-CM | POA: Diagnosis not present

## 2023-06-03 DIAGNOSIS — N200 Calculus of kidney: Secondary | ICD-10-CM | POA: Diagnosis not present

## 2023-06-03 DIAGNOSIS — M545 Low back pain, unspecified: Secondary | ICD-10-CM | POA: Diagnosis not present

## 2023-06-03 DIAGNOSIS — R109 Unspecified abdominal pain: Secondary | ICD-10-CM | POA: Diagnosis not present

## 2023-06-03 DIAGNOSIS — R1030 Lower abdominal pain, unspecified: Secondary | ICD-10-CM | POA: Diagnosis not present

## 2023-06-05 DIAGNOSIS — Z79899 Other long term (current) drug therapy: Secondary | ICD-10-CM | POA: Diagnosis not present

## 2023-06-05 DIAGNOSIS — I10 Essential (primary) hypertension: Secondary | ICD-10-CM | POA: Diagnosis not present

## 2023-06-05 DIAGNOSIS — I4891 Unspecified atrial fibrillation: Secondary | ICD-10-CM | POA: Diagnosis not present

## 2023-06-05 DIAGNOSIS — R32 Unspecified urinary incontinence: Secondary | ICD-10-CM | POA: Diagnosis not present

## 2023-06-05 DIAGNOSIS — Z125 Encounter for screening for malignant neoplasm of prostate: Secondary | ICD-10-CM | POA: Diagnosis not present

## 2023-06-05 DIAGNOSIS — Z6839 Body mass index (BMI) 39.0-39.9, adult: Secondary | ICD-10-CM | POA: Diagnosis not present

## 2023-06-05 DIAGNOSIS — Z09 Encounter for follow-up examination after completed treatment for conditions other than malignant neoplasm: Secondary | ICD-10-CM | POA: Diagnosis not present

## 2023-06-05 DIAGNOSIS — N400 Enlarged prostate without lower urinary tract symptoms: Secondary | ICD-10-CM | POA: Diagnosis not present

## 2023-06-05 DIAGNOSIS — N3941 Urge incontinence: Secondary | ICD-10-CM | POA: Diagnosis not present

## 2023-06-05 DIAGNOSIS — N3281 Overactive bladder: Secondary | ICD-10-CM | POA: Diagnosis not present

## 2023-06-05 DIAGNOSIS — N200 Calculus of kidney: Secondary | ICD-10-CM | POA: Diagnosis not present

## 2023-06-08 ENCOUNTER — Other Ambulatory Visit: Payer: Self-pay | Admitting: Cardiology

## 2023-06-08 DIAGNOSIS — I480 Paroxysmal atrial fibrillation: Secondary | ICD-10-CM

## 2023-06-10 ENCOUNTER — Telehealth: Payer: Self-pay

## 2023-06-10 NOTE — Telephone Encounter (Signed)
Transition Care Management Follow-up Telephone Call//// Date of discharge and from where: 06/03/2023 [facility] How have you been since you were released from the hospital? Patient stated he is feeling fine, but still having some kidney pain. Any questions or concerns? No  Items Reviewed: Did the pt receive and understand the discharge instructions provided? Yes  Medications obtained and verified? Yes  Other? No  Any new allergies since your discharge? No  Dietary orders reviewed? Yes Do you have support at home? Yes   Follow up appointments reviewed:  PCP Hospital f/u appt confirmed? No  Scheduled to see  on  @ . Specialist Hospital f/u appt confirmed?  Patient stated he has seen his urologist.  Scheduled to see  on  @ . Are transportation arrangements needed? No  If their condition worsens, is the pt aware to call PCP or go to the Emergency Dept.? Yes Was the patient provided with contact information for the PCP's office or ED? Yes Was to pt encouraged to call back with questions or concerns? Yes   [facility]  [facility] Population Health Community Resource Care Guide   ??[email or web address]  ?? [phone]   Website: [email or web address]

## 2023-06-10 NOTE — Telephone Encounter (Signed)
Prescription refill request for Eliquis received. Indication:afib Last office visit:7/24 Scr:1.02  1/24 Age: 72 Weight:141.3  kg  Prescription refilled

## 2023-06-12 DIAGNOSIS — E1121 Type 2 diabetes mellitus with diabetic nephropathy: Secondary | ICD-10-CM | POA: Diagnosis not present

## 2023-06-12 DIAGNOSIS — G4730 Sleep apnea, unspecified: Secondary | ICD-10-CM | POA: Diagnosis not present

## 2023-06-12 DIAGNOSIS — E785 Hyperlipidemia, unspecified: Secondary | ICD-10-CM | POA: Diagnosis not present

## 2023-06-12 DIAGNOSIS — G4700 Insomnia, unspecified: Secondary | ICD-10-CM | POA: Diagnosis not present

## 2023-06-12 DIAGNOSIS — R32 Unspecified urinary incontinence: Secondary | ICD-10-CM | POA: Diagnosis not present

## 2023-07-04 DIAGNOSIS — R32 Unspecified urinary incontinence: Secondary | ICD-10-CM | POA: Diagnosis not present

## 2023-07-04 DIAGNOSIS — E1169 Type 2 diabetes mellitus with other specified complication: Secondary | ICD-10-CM | POA: Diagnosis not present

## 2023-07-04 DIAGNOSIS — I10 Essential (primary) hypertension: Secondary | ICD-10-CM | POA: Diagnosis not present

## 2023-07-04 DIAGNOSIS — Z9181 History of falling: Secondary | ICD-10-CM | POA: Diagnosis not present

## 2023-07-04 DIAGNOSIS — Z6839 Body mass index (BMI) 39.0-39.9, adult: Secondary | ICD-10-CM | POA: Diagnosis not present

## 2023-07-04 DIAGNOSIS — K529 Noninfective gastroenteritis and colitis, unspecified: Secondary | ICD-10-CM | POA: Diagnosis not present

## 2023-07-16 DIAGNOSIS — K921 Melena: Secondary | ICD-10-CM | POA: Diagnosis not present

## 2023-07-16 DIAGNOSIS — N401 Enlarged prostate with lower urinary tract symptoms: Secondary | ICD-10-CM | POA: Diagnosis not present

## 2023-07-16 DIAGNOSIS — R195 Other fecal abnormalities: Secondary | ICD-10-CM | POA: Diagnosis not present

## 2023-07-16 DIAGNOSIS — Z6839 Body mass index (BMI) 39.0-39.9, adult: Secondary | ICD-10-CM | POA: Diagnosis not present

## 2023-07-16 DIAGNOSIS — G4700 Insomnia, unspecified: Secondary | ICD-10-CM | POA: Diagnosis not present

## 2023-07-17 DIAGNOSIS — D649 Anemia, unspecified: Secondary | ICD-10-CM | POA: Diagnosis not present

## 2023-07-17 DIAGNOSIS — K921 Melena: Secondary | ICD-10-CM | POA: Diagnosis not present

## 2023-07-17 DIAGNOSIS — E119 Type 2 diabetes mellitus without complications: Secondary | ICD-10-CM | POA: Diagnosis not present

## 2023-07-17 DIAGNOSIS — K2270 Barrett's esophagus without dysplasia: Secondary | ICD-10-CM | POA: Diagnosis not present

## 2023-07-17 DIAGNOSIS — K259 Gastric ulcer, unspecified as acute or chronic, without hemorrhage or perforation: Secondary | ICD-10-CM | POA: Diagnosis not present

## 2023-07-17 DIAGNOSIS — K760 Fatty (change of) liver, not elsewhere classified: Secondary | ICD-10-CM | POA: Diagnosis not present

## 2023-07-18 DIAGNOSIS — I10 Essential (primary) hypertension: Secondary | ICD-10-CM | POA: Diagnosis not present

## 2023-07-18 DIAGNOSIS — K921 Melena: Secondary | ICD-10-CM | POA: Diagnosis not present

## 2023-07-18 DIAGNOSIS — D508 Other iron deficiency anemias: Secondary | ICD-10-CM | POA: Diagnosis not present

## 2023-07-18 DIAGNOSIS — K317 Polyp of stomach and duodenum: Secondary | ICD-10-CM | POA: Diagnosis not present

## 2023-07-18 DIAGNOSIS — K449 Diaphragmatic hernia without obstruction or gangrene: Secondary | ICD-10-CM | POA: Diagnosis not present

## 2023-07-18 DIAGNOSIS — K9289 Other specified diseases of the digestive system: Secondary | ICD-10-CM | POA: Diagnosis not present

## 2023-07-18 DIAGNOSIS — D131 Benign neoplasm of stomach: Secondary | ICD-10-CM | POA: Diagnosis not present

## 2023-07-22 ENCOUNTER — Telehealth: Payer: Self-pay | Admitting: Cardiology

## 2023-07-22 NOTE — Telephone Encounter (Signed)
Pt c/o BP issue: STAT if pt c/o blurred vision, one-sided weakness or slurred speech  1. What are your last 5 BP readings?  100/60 this morning  91/50 yesterday evening 107/62 yesterday morning   2. Are you having any other symptoms (ex. Dizziness, headache, blurred vision, passed out)? Lightheaded and dizziness especially when trying to get up and he's also fell 3 times within the last few weeks.  3. What is your BP issue? Pt is requesting a callback regarding him having concerns about his BP being low lately since. He stated he sent a message as well yesterday through MyChart. Please advise

## 2023-07-22 NOTE — Telephone Encounter (Signed)
Spoke with pt who states that a week ago he was having a GI bleed and has seen Dr. Tama Headings for the same. Pt was advised to stop his Metoprolol and Amlodipine as his BP was very low. Pt states he has been off the medications but his bp remains low. Pt states that he has an appointment on 07/25/23 for [facility] Infusion. Advised to add extra salt in his diet and keep himself hydrated. Please advise.  Pt verbalized understanding and had no additional questions.

## 2023-07-25 ENCOUNTER — Telehealth: Payer: Self-pay | Admitting: *Deleted

## 2023-07-25 DIAGNOSIS — D509 Iron deficiency anemia, unspecified: Secondary | ICD-10-CM | POA: Diagnosis not present

## 2023-07-25 NOTE — Telephone Encounter (Signed)
Pt sent this message on 9/22  Comments: Blood pressure cyclic especially after recent upper scope preformed by Dr Derry Skill.  Reason for upper scope was upper GI bleeding. Found: Bleeding stomach polyps discovered and removed during procedure. Prior to procedure, light headed and fell multiple times.  Visited Dr Lucianne Lei. Discovered very low blood pressure (101/60). UPPIN sent me to see Dr Braulio Conte.  (Result scope) Meisenheimer removed all heart meds after procedure. Procedure was on Thursday, Sept 19th.  Since that time,

## 2023-07-25 NOTE — Telephone Encounter (Signed)
Spent 30 min on phone w/ pt. Requested records from PCP (Dr. Mathis Bud) and GI (Dr. Jennye Boroughs).  Pt is stating that his GI MD verbally told him to "to stop all of my heart medications", and he  was told this on 9/19, "verbally" after his GI procedure.  Said it was b/c of his BPs. (GI scoped pt d/t melena) I did speak w/ GI's NP Morrie Sheldon) about this statement.  She did discuss this w/ MD who did not tell pt to stop his heart medications. He did tell pt to continue Omeprazole BID and restart Eliquis tomorrow.  Pt says he stopped his amlodipine, flecainide, metoprolol and spironolactone.  He did not stop his Entresto. Educated pt to these medications and that Sherryll Burger is a heart medication.  Says his BPs have been low, it was  102/58 at GI OV 9/18,  99/? At PCP 9/17 Today at home 110/60. The MDs have been concerned because he has fallen several times recently, and only slight light headedness occasionally.  Explained that Dr. Elberta Fortis decreased his Entresto dose on 7/29 d/t low BPs.   Aware that I am not sure what medications he should restart, explained MD may hold off on restarting Flecainide and/or Metoprolol. Informed that I would discuss this more w/ Dr. Elberta Fortis next week and let him know his recommendation. Pt agreeable to plan.

## 2023-07-29 ENCOUNTER — Encounter: Payer: Self-pay | Admitting: Cardiology

## 2023-07-31 DIAGNOSIS — N401 Enlarged prostate with lower urinary tract symptoms: Secondary | ICD-10-CM | POA: Diagnosis not present

## 2023-07-31 DIAGNOSIS — G4700 Insomnia, unspecified: Secondary | ICD-10-CM | POA: Diagnosis not present

## 2023-07-31 DIAGNOSIS — Z9181 History of falling: Secondary | ICD-10-CM | POA: Diagnosis not present

## 2023-07-31 DIAGNOSIS — R195 Other fecal abnormalities: Secondary | ICD-10-CM | POA: Diagnosis not present

## 2023-07-31 DIAGNOSIS — Z0000 Encounter for general adult medical examination without abnormal findings: Secondary | ICD-10-CM | POA: Diagnosis not present

## 2023-08-01 DIAGNOSIS — D509 Iron deficiency anemia, unspecified: Secondary | ICD-10-CM | POA: Diagnosis not present

## 2023-08-06 DIAGNOSIS — K921 Melena: Secondary | ICD-10-CM | POA: Diagnosis not present

## 2023-08-13 DIAGNOSIS — M159 Polyosteoarthritis, unspecified: Secondary | ICD-10-CM | POA: Diagnosis not present

## 2023-08-13 DIAGNOSIS — Z6839 Body mass index (BMI) 39.0-39.9, adult: Secondary | ICD-10-CM | POA: Diagnosis not present

## 2023-08-13 DIAGNOSIS — G4700 Insomnia, unspecified: Secondary | ICD-10-CM | POA: Diagnosis not present

## 2023-08-13 DIAGNOSIS — I4891 Unspecified atrial fibrillation: Secondary | ICD-10-CM | POA: Diagnosis not present

## 2023-08-13 DIAGNOSIS — K625 Hemorrhage of anus and rectum: Secondary | ICD-10-CM | POA: Diagnosis not present

## 2023-08-14 ENCOUNTER — Telehealth: Payer: Self-pay | Admitting: Cardiology

## 2023-08-14 NOTE — Telephone Encounter (Signed)
Patient is requesting provider switch from Dr. Tomie China to Dr. Vincent Gros.

## 2023-08-19 DIAGNOSIS — K317 Polyp of stomach and duodenum: Secondary | ICD-10-CM | POA: Diagnosis not present

## 2023-08-19 DIAGNOSIS — K760 Fatty (change of) liver, not elsewhere classified: Secondary | ICD-10-CM | POA: Diagnosis not present

## 2023-08-19 DIAGNOSIS — Z7901 Long term (current) use of anticoagulants: Secondary | ICD-10-CM | POA: Diagnosis not present

## 2023-08-19 DIAGNOSIS — K2270 Barrett's esophagus without dysplasia: Secondary | ICD-10-CM | POA: Diagnosis not present

## 2023-08-19 DIAGNOSIS — D509 Iron deficiency anemia, unspecified: Secondary | ICD-10-CM | POA: Diagnosis not present

## 2023-08-19 DIAGNOSIS — K921 Melena: Secondary | ICD-10-CM | POA: Diagnosis not present

## 2023-08-19 DIAGNOSIS — I4891 Unspecified atrial fibrillation: Secondary | ICD-10-CM | POA: Diagnosis not present

## 2023-08-21 ENCOUNTER — Encounter: Payer: Self-pay | Admitting: Cardiology

## 2023-08-28 ENCOUNTER — Ambulatory Visit: Payer: Medicare Other

## 2023-08-28 VITALS — BP 134/76 | HR 97 | Ht 74.0 in | Wt 304.2 lb

## 2023-08-28 DIAGNOSIS — I2510 Atherosclerotic heart disease of native coronary artery without angina pectoris: Secondary | ICD-10-CM | POA: Diagnosis not present

## 2023-08-28 DIAGNOSIS — I5032 Chronic diastolic (congestive) heart failure: Secondary | ICD-10-CM | POA: Diagnosis not present

## 2023-08-28 DIAGNOSIS — I10 Essential (primary) hypertension: Secondary | ICD-10-CM | POA: Insufficient documentation

## 2023-08-28 DIAGNOSIS — Z6839 Body mass index (BMI) 39.0-39.9, adult: Secondary | ICD-10-CM | POA: Diagnosis not present

## 2023-08-28 DIAGNOSIS — R0609 Other forms of dyspnea: Secondary | ICD-10-CM | POA: Insufficient documentation

## 2023-08-28 DIAGNOSIS — J309 Allergic rhinitis, unspecified: Secondary | ICD-10-CM | POA: Diagnosis not present

## 2023-08-28 DIAGNOSIS — E669 Obesity, unspecified: Secondary | ICD-10-CM | POA: Diagnosis not present

## 2023-08-28 DIAGNOSIS — I4819 Other persistent atrial fibrillation: Secondary | ICD-10-CM | POA: Diagnosis not present

## 2023-08-28 HISTORY — DX: Atherosclerotic heart disease of native coronary artery without angina pectoris: I25.10

## 2023-08-28 HISTORY — DX: Body mass index (BMI) 39.0-39.9, adult: Z68.39

## 2023-08-28 MED ORDER — FUROSEMIDE 40 MG PO TABS: 40.0000 mg | ORAL_TABLET | Freq: Every day | ORAL | 3 refills | Status: DC | PRN

## 2023-08-28 NOTE — Progress Notes (Signed)
Cardiology Consultation:    Date:  08/28/2023   ID:  Alvira Monday., DOB [birth year], MRN [id number]  PCP:  Lucianne Lei, MD  Cardiologist:  Marlyn Corporal Akaiya Touchette, MD   Referring MD: Lucianne Lei, MD   Chief Complaint  Patient presents with   Establish Care    Previous Cardiologist Dr. Tomie China     ASSESSMENT AND PLAN:   72 year old male patient with history of persistent atrial fibrillation s/p ablations in the past, chronic CHF diastolic, hypertension, hyperlipidemia obesity, diabetes mellitus, obstructive sleep apnea uses CPAP, BPH here for follow-up visit.  Problem List Items Addressed This Visit     CHF (congestive heart failure) (HCC)    Chronic diastolic CHF with recovered LVEF, from previous EF of 35 to 40% in November 2019, last echocardiogram May 2021 with EF 60 to 65%.  Nonischemic cardiomyopathy based on ischemic workup with stress MPI in December 2020 and CT coronary angiogram February 2021.  Continue with salt and fluid restriction to below 2 g/day and below 2 L/day respectively.  Today appears mildly volume overloaded. Recommended starting furosemide 40 mg once a day for the next 3 days, subsequently use on an as-needed basis for weight gain over 2 to 3 pounds within a day or 5 pounds within a week.  Will have him return for a nurse visit in 1 week for weight check and for basic metabolic panel, magnesium, proBNP check.  From guideline-directed medical therapy standpoint continue with metoprolol succinate 50 mg twice daily, Entresto 49 mg / 50 mg dose twice daily Spironolactone 25 mg once daily He remains on diabetic dose of Jardiance 25 mg once daily being managed by PCP.  Will obtain a transthoracic echocardiogram repeat for baseline assessment of cardiac structure function and valve function.       Hypertension    Near optimal at this time. Target below 130 over 80 mmHg.  Continue his current medications metoprolol succinate 50 mg twice daily,  Entresto 49 mg / 51 mg twice daily, spironolactone 25 mg once daily.       Persistent atrial fibrillation [facility]) - Primary    Ablation  December 2021 and redo ablation in September 2022. Remains on rhythm control with flecainide 100 mg twice daily and metoprolol succinate 50 mg twice daily.  CHADS2 Vascor 4, on anticoagulation with Eliquis 5 mg twice daily. Good adherence. Remains in sinus rhythm today with prolonged PR interval.  Able to monitor for symptoms and rhythm with the apple smart watch at home. Continue to follow-up with Dr. Elberta Fortis.        Relevant Orders   EKG 12-Lead (Completed)   BMI 39.0-39.9,adult   Coronary atherosclerosis calcium score 65 on CT coronary angiogram September 2022    Continue with lipid-lowering therapy using atorvastatin 20 mg once daily.  Continue with Eliquis which is on for A-fib. If ever anticoagulation is interrupted he should be on aspirin 81 mg once daily if no contraindication.       Return to clinic in 3 months. Will obtain a transthoracic echocardiogram to be done prior to the next visit. He will be returning in about 1 week for nurse visit to follow-up on weight, blood work.   History of Present Illness:    Sean [name]. is a 72 y.o. male who is being seen today for follow-up about A-fib, heart failure. His PCP is Lucianne Lei, MD.  Pleasant gentleman last visit with our office was May 27, 2023 with Dr. Elberta Fortis in [place] .  History of atrial fibrillation, CHF diastolic, hypertension, hyperlipidemia, obesity, diabetes mellitus, obstructive sleep apnea-uses CPAP, BPH  has history of atrial fibrillation CHADS2 Vascor of 4, persistent A-fib s/p ablation in December 2021 and redo ablation in September 2022.  Remains on Eliquis for anticoagulation and on rhythm control with flecainide. Also has history of chronic systolic heart failure with nonischemic cardiomyopathy and recovered LVEF [35 to 40% in November 2019, last echo May 2021  EF 60 to 65%], hypertension.   History of stress test with Lexiscan nuclear imaging December 2020 showed no evidence of ischemia. CT coronary angiogram February 2021 showed no obstructive coronary artery disease, calcium score was 2.3. Repeat CT coronary angiogram September 2022 with calcium score 65. Last echocardiogram May 2021 reported LVEF 60 to 65%, borderline LVH, normal RV function.  Mentions has been generally doing well.  Over the last week he has not had symptoms of upper airway congestion and used budesonide formoterol inhaler once on the weekend and has felt poorly with the medication, notably for a sensation of lightheadedness with positional change.  He has follow-up visit with his PCP to further discuss this medication.  From cardiac standpoint he mentions he can tell if he is in A-fib.  Has not felt the same many months.  Uses apple smart watch and able to tell if he is in A-fib regular rhythm.  Denies any chest pain.  Mentions mild tiredness recently.  He did not observe but he does have bilateral lower extremity edema over the last few days.  Does not have a scale at home to weigh himself regularly but thinks he was about 300 pounds at home.  Weight here in the office today 304 pounds. Mentions his good with dietary salt and fluid restriction.  He has furosemide pills at home which she has not used in a long time. Compliance with the rest of his medications.  EKG in the clinic today shows sinus rhythm with heart rate 97/min, is noted to have prolonged PR interval.  With T wave and p wave measuring, difficult to accurately assess QT interval.  Blood work last available to review is from November 06, 2022 BUN 15, creatinine 1.02, EGFR 78, sodium 143, potassium 4.1 Past Medical History:  Diagnosis Date   Abdominal bruit 10/23/2022   Allergic rhinitis    Arrhythmia    Atrial fibrillation (HCC)    Atrial flutter (HCC)    Barrett's esophagus 08/03/2020   Benign prostatic  hyperplasia with urinary obstruction 11/29/2018   Cardiomyopathy (HCC) 02/23/2020   CHF (congestive heart failure) (HCC)    Closed fracture of fifth metacarpal bone of left hand 08/11/2022   Diabetes mellitus due to underlying condition with unspecified complications (HCC) 09/29/2019   Diabetes mellitus without complication (HCC)    DOE (dyspnea on exertion) 10/29/2019   S/p PE  11/2018 > DOAC recurrent 09/14/2019 off DOAC so resumed  - onset variable doe and orthostatic lightheadedness 06/2019 with afib - PFT's [facility]  10/08/2019 :  FEV1 2.79 (73%) with ratio 75 and 6 % better p saba,  Air trapping on lung vol,  ERV 14% and dlco 22.78 (60%) corrects to 3.68 (76%) for vol with minimal concavity to f/v loop  - 10/29/2019   Walked RA x two laps =  approx 561ft @ avg   Essential hypertension 09/29/2019   Fissure in skin of foot 12/11/2016   GERD (gastroesophageal reflux disease) 11/29/2018   Hemospermia 02/22/2020   History of colonic polyps 08/03/2020   History of  pulmonary embolism 09/29/2019   Hyperlipidemia    Hypertension    Hypogonadism in male 02/22/2020   IBS (irritable bowel syndrome) 11/29/2018   Impotence 02/22/2020   Incomplete emptying of bladder 02/22/2020   Melena 08/03/2020   Mixed dyslipidemia 09/29/2019   Morbid obesity (HCC) 09/29/2019   Nonalcoholic fatty liver disease 08/03/2020   Open fracture of shaft of metacarpal bone 08/11/2022   OSA (obstructive sleep apnea)    Overgrown toenails 12/11/2016   PAF (paroxysmal atrial fibrillation) (HCC) 02/23/2020   Pain of left hand 08/09/2022   Persistent atrial fibrillation (HCC) 09/29/2019   Plantar fasciitis, bilateral 06/05/2016   S/P ablation of atrial fibrillation 02/23/2020   Secondary hypercoagulable state (HCC) 01/22/2020   Submucosal lesion of stomach 08/03/2020   Upper airway cough syndrome 12/15/2019   Noted on exam 12/15/2019 onset while on Entresto, resolved with purse lip    Past Surgical History:   Procedure Laterality Date   AMPUTATION Left 08/07/2022   Procedure: AMPUTATION FIFTH DIGIT;  Surgeon: Gomez Cleverly, MD;  Location: [facility] OR;  Service: Orthopedics;  Laterality: Left;   ATRIAL FIBRILLATION ABLATION N/A 12/25/2019   Procedure: ATRIAL FIBRILLATION ABLATION;  Surgeon: Regan Lemming, MD;  Location: [facility] INVASIVE CV LAB;  Service: Cardiovascular;  Laterality: N/A;   ATRIAL FIBRILLATION ABLATION N/A 07/19/2021   Procedure: ATRIAL FIBRILLATION ABLATION;  Surgeon: Regan Lemming, MD;  Location: [facility] INVASIVE CV LAB;  Service: Cardiovascular;  Laterality: N/A;   BIOPSY  08/11/2020   Procedure: BIOPSY;  Surgeon: Rachael Fee, MD;  Location: [facility] ENDOSCOPY;  Service: Endoscopy;;   CARDIAC CATHETERIZATION     CARDIOVERSION N/A 04/17/2021   Procedure: CARDIOVERSION;  Surgeon: Yvonne Kendall, MD;  Location: [facility] ORS;  Service: Cardiovascular;  Laterality: N/A;   ESOPHAGOGASTRODUODENOSCOPY (EGD) WITH PROPOFOL N/A 08/11/2020   Procedure: ESOPHAGOGASTRODUODENOSCOPY (EGD) WITH PROPOFOL;  Surgeon: Rachael Fee, MD;  Location: [facility] ENDOSCOPY;  Service: Endoscopy;  Laterality: N/A;   I & D EXTREMITY Left 08/07/2022   Procedure: IRRIGATION AND DEBRIDEMENT;  Surgeon: Gomez Cleverly, MD;  Location: [facility] OR;  Service: Orthopedics;  Laterality: Left;   KNEE SURGERY     PILONIDAL CYST / SINUS EXCISION     UPPER ESOPHAGEAL ENDOSCOPIC ULTRASOUND (EUS) N/A 08/11/2020   Procedure: UPPER ESOPHAGEAL ENDOSCOPIC ULTRASOUND (EUS);  Surgeon: Rachael Fee, MD;  Location: [facility];  Service: Endoscopy;  Laterality: N/A;    Current Medications: Current Meds  Medication Sig   albuterol (VENTOLIN HFA) 108 (90 Base) MCG/ACT inhaler Inhale 2 puffs into the lungs every 6 (six) hours as needed for wheezing or shortness of breath.    allopurinol (ZYLOPRIM) 100 MG tablet Take 100 mg by mouth in the morning.   Ascorbic Acid (VITAMIN C) 1000 MG tablet Take 1,000 mg by mouth 2 (two) times daily.    atorvastatin (LIPITOR) 20 MG tablet Take 20 mg by mouth daily.   budesonide-formoterol (SYMBICORT) 160-4.5 MCG/ACT inhaler Inhale 1 puff into the lungs daily.   Cholecalciferol (VITAMIN D3) 50 MCG (2000 UT) TABS Take 2,000 Units by mouth 2 (two) times daily.    doxycycline (VIBRAMYCIN) 100 MG capsule Take 100 mg by mouth daily.   ELIQUIS 5 MG TABS tablet Take 1 tablet (5 mg total) by mouth 2 (two) times daily.   empagliflozin (JARDIANCE) 25 MG TABS tablet Take 25 mg by mouth in the morning.   Eszopiclone 3 MG TABS Take 3 mg by mouth at bedtime.   finasteride (PROSCAR) 5 MG tablet Take 5 mg  by mouth daily.   flecainide (TAMBOCOR) 100 MG tablet Take 1 tablet (100 mg total) by mouth 2 (two) times daily.   fluticasone (FLONASE) 50 MCG/ACT nasal spray Place 2 sprays into both nostrils daily as needed for allergies or rhinitis.   insulin lispro (HUMALOG) 100 UNIT/ML injection Inject 0-10 Units into the skin 3 (three) times daily as needed for high blood sugar. Sliding Scale   metoprolol succinate (TOPROL-XL) 50 MG 24 hr tablet Take 1 tablet (50 mg total) by mouth 2 (two) times daily. Take with or immediately following a meal.   montelukast (SINGULAIR) 10 MG tablet Take 10 mg by mouth at bedtime.   Omega-3 Fatty Acids (FISH OIL) 1200 MG CAPS Take 1,200 mg by mouth 2 (two) times daily.    omeprazole (PRILOSEC) 40 MG capsule Take 40 mg by mouth 2 (two) times daily.    rOPINIRole (REQUIP) 4 MG tablet Take 4 mg by mouth daily.   sacubitril-valsartan (ENTRESTO) 49-51 MG Take 1 tablet by mouth 2 (two) times daily.   spironolactone (ALDACTONE) 25 MG tablet Take 1 tablet (25 mg total) by mouth daily.   tamsulosin (FLOMAX) 0.4 MG CAPS capsule Take 0.4 capsules by mouth 2 (two) times daily.   TOUJEO MAX SOLOSTAR 300 UNIT/ML Solostar Pen Inject 128 Units into the skin at bedtime.   venlafaxine XR (EFFEXOR-XR) 150 MG 24 hr capsule Take 150 mg by mouth in the morning.   Vitamin D, Ergocalciferol, (DRISDOL) 1.25 MG  (50000 UT) CAPS capsule Take 50,000 Units by mouth every Tuesday. IN THE MORNING   [DISCONTINUED] amLODipine (NORVASC) 10 MG tablet Take 10 mg by mouth daily.   [DISCONTINUED] furosemide (LASIX) 40 MG tablet Take 40 mg by mouth daily as needed for fluid.   [DISCONTINUED] OZEMPIC, 0.25 OR 0.5 MG/DOSE, 2 MG/3ML SOPN Inject 1 Dose into the skin as directed.     Allergies:   Sitagliptin, Prednisone, Victoza [liraglutide], and Sulfa antibiotics   Social History   Socioeconomic History   Marital status: Married    Spouse name: Not on file   Number of children: Not on file   Years of education: Not on file   Highest education level: Not on file  Occupational History   Not on file  Tobacco Use   Smoking status: Never   Smokeless tobacco: Never  Vaping Use   Vaping status: Never Used  Substance and Sexual Activity   Alcohol use: Not Currently   Drug use: Never   Sexual activity: Not on file  Other Topics Concern   Not on file  Social History Narrative   Not on file   Social Determinants of Health   Financial Resource Strain: Low Risk  (07/11/2022)   Overall Financial Resource Strain (CARDIA)    Difficulty of Paying Living Expenses: Not hard at all  Food Insecurity: No Food Insecurity (07/11/2022)   Hunger Vital Sign    Worried About Running Out of Food in the Last Year: Never true    Ran Out of Food in the Last Year: Never true  Transportation Needs: No Transportation Needs (07/11/2022)   PRAPARE - Administrator, Civil Service (Medical): No    Lack of Transportation (Non-Medical): No  Physical Activity: Not on file  Stress: Not on file  Social Connections: Not on file     Family History: The patient's family history includes Colon cancer in his father; Hyperlipidemia in his father; Hypertension in his father; Liver cancer in his father; Lung cancer in  his father. ROS:   Please see the history of present illness.    All 14 point review of systems negative except  as described per history of present illness.  EKGs/Labs/Other Studies Reviewed:    The following studies were reviewed today:   EKG:  EKG Interpretation Date/Time:  Wednesday August 28 2023 10:19:23 EDT Ventricular Rate:  97 PR Interval:    QRS Duration:  104 QT Interval:  440 QTC Calculation: 558 R Axis:   -16  Text Interpretation: Sinus rhythm with first degree AV block Low voltage QRS Abnormal ECG When compared with ECG of 27-May-2023 12:01, Vent. rate has increased BY  40 BPM Sinus rhythm with first degree AV block QT has lengthened Confirmed by Huntley Dec reddy [phone]) on 08/28/2023 11:00:49 AM    Recent Labs: 11/06/2022: BUN 15; Creatinine, Ser 1.02; Potassium 4.1; Sodium 143  Recent Lipid Panel    Component Value Date/Time   CHOL 135 10/09/2019 0820   TRIG 159 (H) 10/09/2019 0820   HDL 47 10/09/2019 0820   CHOLHDL 2.9 10/09/2019 0820   LDLCALC 61 10/09/2019 0820    Physical Exam:    VS:  BP 134/76 (BP Location: Left Arm, Patient Position: Sitting)   Pulse 97   Ht 6\' 2"  (1.88 m)   Wt (!) 304 lb 3.2 oz (138 kg)   SpO2 92%   BMI 39.06 kg/m     Wt Readings from Last 3 Encounters:  08/28/23 (!) 304 lb 3.2 oz (138 kg)  05/27/23 (!) 311 lb 6.4 oz (141.3 kg)  11/19/22 (!) 304 lb 6.4 oz (138.1 kg)     GENERAL:  Well nourished, well developed in no acute distress NECK: No JVD; No carotid bruits CARDIAC: RRR, S1 and S2 present, no murmurs, no rubs, no gallops CHEST:  Clear to auscultation without rales, wheezing or rhonchi  Extremities: No pitting pedal edema. Pulses bilaterally symmetric with radial 2+ and dorsalis pedis 2+ NEUROLOGIC:  Alert and oriented x 3  Medication Adjustments/Labs and Tests Ordered: Current medicines are reviewed at length with the patient today.  Concerns regarding medicines are outlined above.  Orders Placed This Encounter  Procedures   EKG 12-Lead   No orders of the defined types were placed in this  encounter.   Signed, Melvina Pangelinan reddy Vida Nicol, MD, MPH, [facility]. 08/28/2023 11:32 AM    [facility]

## 2023-08-28 NOTE — Assessment & Plan Note (Signed)
Near optimal at this time. Target below 130 over 80 mmHg.  Continue his current medications metoprolol succinate 50 mg twice daily, Entresto 49 mg / 51 mg twice daily, spironolactone 25 mg once daily.

## 2023-08-28 NOTE — Assessment & Plan Note (Signed)
Chronic diastolic CHF with recovered LVEF, from previous EF of 35 to 40% in November 2019, last echocardiogram May 2021 with EF 60 to 65%.  Nonischemic cardiomyopathy based on ischemic workup with stress MPI in December 2020 and CT coronary angiogram February 2021.  Continue with salt and fluid restriction to below 2 g/day and below 2 L/day respectively.  Today appears mildly volume overloaded. Recommended starting furosemide 40 mg once a day for the next 3 days, subsequently use on an as-needed basis for weight gain over 2 to 3 pounds within a day or 5 pounds within a week.  Will have him return for a nurse visit in 1 week for weight check and for basic metabolic panel, magnesium, proBNP check.  From guideline-directed medical therapy standpoint continue with metoprolol succinate 50 mg twice daily, Entresto 49 mg / 50 mg dose twice daily Spironolactone 25 mg once daily He remains on diabetic dose of Jardiance 25 mg once daily being managed by PCP.  Will obtain a transthoracic echocardiogram repeat for baseline assessment of cardiac structure function and valve function.

## 2023-08-28 NOTE — Assessment & Plan Note (Signed)
Continue with lipid-lowering therapy using atorvastatin 20 mg once daily.  Continue with Eliquis which is on for A-fib. If ever anticoagulation is interrupted he should be on aspirin 81 mg once daily if no contraindication.

## 2023-08-28 NOTE — Patient Instructions (Addendum)
Medication Instructions:   START: Lasix 40mg  everyday for next 3 days then as needed   Lab Work: Your physician recommends that you return for lab work in: 1 week You need to have labs done when you are fasting.  You can come Monday through Friday 8:30 am to 12:00 pm and 1:15 to 4:30. You do not need to make an appointment as the order has already been placed. The labs you are going to have done are BMET, ProBNP, MGM    Testing/Procedures: None Ordered   Follow-Up: At [facility], you and your health needs are our priority.  As part of our continuing mission to provide you with exceptional heart care, we have created designated Provider Care Teams.  These Care Teams include your primary Cardiologist (physician) and Advanced Practice Providers (APPs -  Physician Assistants and Nurse Practitioners) who all work together to provide you with the care you need, when you need it.  We recommend signing up for the patient portal called "MyChart".  Sign up information is provided on this After Visit Summary.  MyChart is used to connect with patients for Virtual Visits (Telemedicine).  Patients are able to view lab/test results, encounter notes, upcoming appointments, etc.  Non-urgent messages can be sent to your provider as well.   To learn more about what you can do with MyChart, go to [email or web address].    Your next appointment:   3 month(s)  The format for your next appointment:   In Person  Provider:   Huntley Dec, MD    Other Instructions Nurse Visit in 1 week for weight check and blood work

## 2023-08-28 NOTE — Assessment & Plan Note (Signed)
Ablation  December 2021 and redo ablation in September 2022. Remains on rhythm control with flecainide 100 mg twice daily and metoprolol succinate 50 mg twice daily.  CHADS2 Vascor 4, on anticoagulation with Eliquis 5 mg twice daily. Good adherence. Remains in sinus rhythm today with prolonged PR interval.  Able to monitor for symptoms and rhythm with the apple smart watch at home. Continue to follow-up with Dr. Elberta Fortis.

## 2023-09-04 ENCOUNTER — Ambulatory Visit: Payer: Medicare Other | Attending: Cardiology

## 2023-09-04 DIAGNOSIS — I10 Essential (primary) hypertension: Secondary | ICD-10-CM | POA: Diagnosis not present

## 2023-09-04 DIAGNOSIS — R0609 Other forms of dyspnea: Secondary | ICD-10-CM | POA: Diagnosis not present

## 2023-09-04 NOTE — Progress Notes (Signed)
   Nurse Visit   Date of Encounter: 09/04/2023 ID: Sean [name]., DOB [birth year], MRN [id number]  PCP:  Lucianne Lei, MD   [facility] Providers Cardiologist:  Kathie Rhodes Madireddy Electrophysiologist:  Regan Lemming, MD      Visit Details   VS:  There were no vitals taken for this visit. , BMI There is no height or weight on file to calculate BMI.  Wt Readings from Last 3 Encounters:  08/28/23 (!) 304 lb 3.2 oz (138 kg)  05/27/23 (!) 311 lb 6.4 oz (141.3 kg)  11/19/22 (!) 304 lb 6.4 oz (138.1 kg)     Reason for visit: Vitals and labs Performed today: Vitals, Provider consulted:Madireddy, and Education Changes (medications, testing, etc.) : Take Lasix every other day Length of Visit: 15 minutes    Medications Adjustments/Labs and Tests Ordered: No orders of the defined types were placed in this encounter.  No orders of the defined types were placed in this encounter.    Signed, Sean Chiquito, RN  09/04/2023 9:30 AM

## 2023-09-05 LAB — BASIC METABOLIC PANEL
BUN/Creatinine Ratio: 13 (ref 10–24)
BUN: 12 mg/dL (ref 8–27)
CO2: 22 mmol/L (ref 20–29)
Calcium: 9.1 mg/dL (ref 8.6–10.2)
Chloride: 101 mmol/L (ref 96–106)
Creatinine, Ser: 0.94 mg/dL (ref 0.76–1.27)
Glucose: 128 mg/dL — ABNORMAL HIGH (ref 70–99)
Potassium: 4.2 mmol/L (ref 3.5–5.2)
Sodium: 136 mmol/L (ref 134–144)
eGFR: 85 mL/min/{1.73_m2} (ref >59)

## 2023-09-05 LAB — PRO B NATRIURETIC PEPTIDE: NT-Pro BNP: 37 pg/mL (ref 0–376)

## 2023-09-05 LAB — MAGNESIUM: Magnesium: 2 mg/dL (ref 1.6–2.3)

## 2023-09-12 DIAGNOSIS — E785 Hyperlipidemia, unspecified: Secondary | ICD-10-CM | POA: Diagnosis not present

## 2023-09-12 DIAGNOSIS — R32 Unspecified urinary incontinence: Secondary | ICD-10-CM | POA: Diagnosis not present

## 2023-09-12 DIAGNOSIS — G4700 Insomnia, unspecified: Secondary | ICD-10-CM | POA: Diagnosis not present

## 2023-09-12 DIAGNOSIS — Z6839 Body mass index (BMI) 39.0-39.9, adult: Secondary | ICD-10-CM | POA: Diagnosis not present

## 2023-09-12 DIAGNOSIS — G4730 Sleep apnea, unspecified: Secondary | ICD-10-CM | POA: Diagnosis not present

## 2023-09-12 DIAGNOSIS — M7989 Other specified soft tissue disorders: Secondary | ICD-10-CM | POA: Diagnosis not present

## 2023-09-12 DIAGNOSIS — D649 Anemia, unspecified: Secondary | ICD-10-CM | POA: Diagnosis not present

## 2023-09-29 ENCOUNTER — Other Ambulatory Visit: Payer: Self-pay | Admitting: Cardiology

## 2023-09-29 DIAGNOSIS — I10 Essential (primary) hypertension: Secondary | ICD-10-CM

## 2023-10-14 DIAGNOSIS — G4700 Insomnia, unspecified: Secondary | ICD-10-CM | POA: Diagnosis not present

## 2023-10-14 DIAGNOSIS — E1121 Type 2 diabetes mellitus with diabetic nephropathy: Secondary | ICD-10-CM | POA: Diagnosis not present

## 2023-10-14 DIAGNOSIS — N401 Enlarged prostate with lower urinary tract symptoms: Secondary | ICD-10-CM | POA: Diagnosis not present

## 2023-10-14 DIAGNOSIS — Z6839 Body mass index (BMI) 39.0-39.9, adult: Secondary | ICD-10-CM | POA: Diagnosis not present

## 2023-11-01 ENCOUNTER — Telehealth: Payer: Self-pay

## 2023-11-01 DIAGNOSIS — I4819 Other persistent atrial fibrillation: Secondary | ICD-10-CM

## 2023-11-01 NOTE — Telephone Encounter (Signed)
-----   Message from [place] R Madireddy sent at 10/31/2023 10:40 AM EST ----- Lab results were reviewed, stable electrolytes, kidney function, normal NT proBNP and magnesium levels.  Please make sure he has an order for echocardiogram to be completed prior to next visit in 3 months?  Thank you.

## 2023-11-01 NOTE — Telephone Encounter (Signed)
 MyChart message

## 2023-11-13 DIAGNOSIS — J309 Allergic rhinitis, unspecified: Secondary | ICD-10-CM | POA: Diagnosis not present

## 2023-11-13 DIAGNOSIS — G4700 Insomnia, unspecified: Secondary | ICD-10-CM | POA: Diagnosis not present

## 2023-11-13 DIAGNOSIS — E1169 Type 2 diabetes mellitus with other specified complication: Secondary | ICD-10-CM | POA: Diagnosis not present

## 2023-11-13 DIAGNOSIS — Z6839 Body mass index (BMI) 39.0-39.9, adult: Secondary | ICD-10-CM | POA: Diagnosis not present

## 2023-11-13 DIAGNOSIS — I4891 Unspecified atrial fibrillation: Secondary | ICD-10-CM | POA: Diagnosis not present

## 2023-11-13 DIAGNOSIS — E559 Vitamin D deficiency, unspecified: Secondary | ICD-10-CM | POA: Diagnosis not present

## 2023-11-14 DIAGNOSIS — Z79899 Other long term (current) drug therapy: Secondary | ICD-10-CM | POA: Diagnosis not present

## 2023-11-14 DIAGNOSIS — E559 Vitamin D deficiency, unspecified: Secondary | ICD-10-CM | POA: Diagnosis not present

## 2023-11-14 DIAGNOSIS — E1169 Type 2 diabetes mellitus with other specified complication: Secondary | ICD-10-CM | POA: Diagnosis not present

## 2023-11-21 ENCOUNTER — Ambulatory Visit: Payer: Medicare Other

## 2023-11-21 DIAGNOSIS — I4819 Other persistent atrial fibrillation: Secondary | ICD-10-CM | POA: Diagnosis not present

## 2023-11-21 LAB — ECHOCARDIOGRAM COMPLETE
Area-P 1/2: 3.68 cm2
S' Lateral: 3.9 cm

## 2023-11-25 ENCOUNTER — Telehealth: Payer: Self-pay | Admitting: Emergency Medicine

## 2023-11-25 NOTE — Telephone Encounter (Signed)
-----   Message from Marlyn Corporal Madireddy sent at 11/23/2023 12:06 AM EST ----- I sent a note to him on MyChart. Please call him and confirm receipt of the message as an not sure if he frequently checks his messages.   Please forward results to his PCP. Thank you

## 2023-11-25 NOTE — Telephone Encounter (Signed)
Results reviewed with pt as per Dr. Madireddy's note.  Pt verbalized understanding and had no additional questions. Routed to PCP

## 2023-11-28 ENCOUNTER — Ambulatory Visit: Payer: Medicare Other

## 2023-11-28 VITALS — BP 140/80 | HR 89 | Ht 74.0 in | Wt 308.0 lb

## 2023-11-28 DIAGNOSIS — I5032 Chronic diastolic (congestive) heart failure: Secondary | ICD-10-CM

## 2023-11-28 DIAGNOSIS — D509 Iron deficiency anemia, unspecified: Secondary | ICD-10-CM | POA: Diagnosis not present

## 2023-11-28 DIAGNOSIS — K760 Fatty (change of) liver, not elsewhere classified: Secondary | ICD-10-CM | POA: Diagnosis not present

## 2023-11-28 DIAGNOSIS — K3189 Other diseases of stomach and duodenum: Secondary | ICD-10-CM | POA: Diagnosis not present

## 2023-11-28 DIAGNOSIS — Z01810 Encounter for preprocedural cardiovascular examination: Secondary | ICD-10-CM | POA: Diagnosis not present

## 2023-11-28 DIAGNOSIS — I2510 Atherosclerotic heart disease of native coronary artery without angina pectoris: Secondary | ICD-10-CM | POA: Diagnosis not present

## 2023-11-28 DIAGNOSIS — K2270 Barrett's esophagus without dysplasia: Secondary | ICD-10-CM | POA: Diagnosis not present

## 2023-11-28 DIAGNOSIS — K317 Polyp of stomach and duodenum: Secondary | ICD-10-CM | POA: Diagnosis not present

## 2023-11-28 HISTORY — DX: Encounter for preprocedural cardiovascular examination: Z01.810

## 2023-11-28 NOTE — Patient Instructions (Signed)
Medication Instructions:  Your physician recommends that you continue on your current medications as directed. Please refer to the Current Medication list given to you today.  *If you need a refill on your cardiac medications before your next appointment, please call your pharmacy*   Lab Work: None Ordered If you have labs (blood work) drawn today and your tests are completely normal, you will receive your results only by: MyChart Message (if you have MyChart) OR A paper copy in the mail If you have any lab test that is abnormal or we need to change your treatment, we will call you to review the results.   Testing/Procedures: None Ordered   Follow-Up: At [facility], you and your health needs are our priority.  As part of our continuing mission to provide you with exceptional heart care, we have created designated Provider Care Teams.  These Care Teams include your primary Cardiologist (physician) and Advanced Practice Providers (APPs -  Physician Assistants and Nurse Practitioners) who all work together to provide you with the care you need, when you need it.  We recommend signing up for the patient portal called "MyChart".  Sign up information is provided on this After Visit Summary.  MyChart is used to connect with patients for Virtual Visits (Telemedicine).  Patients are able to view lab/test results, encounter notes, upcoming appointments, etc.  Non-urgent messages can be sent to your provider as well.   To learn more about what you can do with MyChart, go to [email or web address].    Your next appointment:   12 month(s)  The format for your next appointment:   In Person  Provider:   Huntley Dec, MD    Other Instructions NA

## 2023-11-28 NOTE — Assessment & Plan Note (Signed)
Recovered LVEF from 35 to 40% in 07 September 2018. Recent echocardiogram January 2025 LVEF 60 to 65%, normal RV function.  Euvolemic and compensated today.  NYHA class II limited functional status due to his joint aches.  Adheres with salt and fluid restriction. Continue with furosemide 40 mg every other day. Aware to watch his weight and take additional doses if weight goes up by 2 to 3 pounds within a day or 5 pounds within a week.  Continue with guideline directed medical therapy. He currently remains on metoprolol succinate 50 mg once daily Entresto 49 mg / 50 mg twice daily Spironolactone 25 mg once daily On diabetic dose for Jardiance 25 mg once daily.

## 2023-11-28 NOTE — Progress Notes (Signed)
Cardiology Consultation:    Date:  11/28/2023   ID:  Sean Monday., DOB [birth year], MRN [id number]  PCP:  Sean Lei, MD  Cardiologist:  Sean Corporal Andrell Tallman, MD   Referring MD: Sean Lei, MD   Chief Complaint  Patient presents with   Follow-up     ASSESSMENT AND PLAN:   Sean [name] 73 year old man with history of CHF with recovered LVEF and chronic diastolic heart failure, persistent A-fib s/p ablations in the past and on rhythm can hold with flecainide, coronary atherosclerosis with calcium score 65 but no significant obstructive disease on prior CT coronary angiogram February 2021, hypertension, hyperlipidemia, obesity, diabetes mellitus, obstructive sleep apnea [uses CPAP], BPH, osteoarthritis of both knees and chronic back pain from lumbar spinal stenosis here for follow-up visit. Problem List Items Addressed This Visit     CHF (congestive heart failure) (HCC) - Primary   Recovered LVEF from 35 to 40% in 07 September 2018. Recent echocardiogram January 2025 LVEF 60 to 65%, normal RV function.  Euvolemic and compensated today.  NYHA class II limited functional status due to his joint aches.  Adheres with salt and fluid restriction. Continue with furosemide 40 mg every other day. Aware to watch his weight and take additional doses if weight goes up by 2 to 3 pounds within a day or 5 pounds within a week.  Continue with guideline directed medical therapy. He currently remains on metoprolol succinate 50 mg once daily Entresto 49 mg / 50 mg twice daily Spironolactone 25 mg once daily On diabetic dose for Jardiance 25 mg once daily.        Coronary atherosclerosis calcium score 65 on CT coronary angiogram September 2022; prior cardiac CT 03/17/2020 no obstructive disease   Asymptomatic. Stable. Currently not on aspirin as he remains on Eliquis for stroke prophylaxis in setting of A-fib. Aware to switch to aspirin if Eliquis is interrupted for any procedures.       Preoperative cardiovascular examination   From cardiac standpoint he is okay to proceed with elective noncardiac surgery for the knees as needed.  Eliquis can be held as per surgeons protocol typically 2 to 3 days prior to the procedure and resume postprocedure once hemostatic and okay with the surgeon. Would recommend starting low-dose aspirin 81 mg once daily 7 days prior to his surgery for the anticipated interruption to his Eliquis regimen.   If there is anything we can clinical change in terms of his functional capacity or cardiac symptoms prior to surgery please do reach back to health for further preoperative cardiovascular risk assessment.      Return to clinic for follow-up with [place] in 1 year. He has pending follow-up with electrophysiologist Sean [name] will request the office to schedule him for the same.   History of Present Illness:    Sean Lincks. is a 73 y.o. male who is being seen today for follow-up visit. Last visit with me in the office was 08-18-2023. PCP is Sean Lei, MD. Follows up with Sean [name] and EP  Has a history of persistent atrial fibrillation s/p ablations in the past on rhythm control with flecainide, chronic diastolic CHF with recovered LVEF, calcium score 65 on cardiac CT done prior to ablation September 2022,  no significant obstructive coronary disease on CT coronary angiogram February 2021, hypertension, hyperlipidemia, obesity, diabetes mellitus, obstructive sleep apnea-uses CPAP, BPH osteoarthritis of both knees and chronic back pain from lumbar spinal stenosis.  At last visit with pedal edema we  prescribed him furosemide to be used for few days and subsequently to use it as needed. Echocardiogram was done 11-21-2023 that shows normal biventricular function LVEF 60 to 65% with GLS -16%, RV function normal, ascending aorta measurement 40 mm, considering his body habitus this falls within upper limits of normal and has been steady.  Reviewed images  myself, aortic root measured 40 mm and ascending aorta measured 3.4 cm in size.  Screenshots of the images from echocardiogram attached below.  Based on his body surface area both these numbers are within normal limits.   Here for the visit today by himself. Mentions overall no significant change from cardiac standpoint.  His functional status is somewhat limited due to bilateral knee pain and chronic back pain from lumbar spinal stenosis.  Over the Christmas holidays the new year he gained few pounds secondary to his diet.  Has not noticed any significant increase in shortness of breath or pedal edema.  He has been taking furosemide 40 mg every other day and has been doing well.  He is following up with the orthopedics team and may anticipate surgery for the knees and near future.    Past Medical History:  Diagnosis Date   Abdominal bruit 10/23/2022   Allergic rhinitis    Arrhythmia    Atrial fibrillation (HCC)    Atrial flutter (HCC)    Barrett's esophagus 08/03/2020   Benign prostatic hyperplasia with urinary obstruction 11/29/2018   Cardiomyopathy (HCC) 02/23/2020   CHF (congestive heart failure) (HCC)    Closed fracture of fifth metacarpal bone of left hand 08/11/2022   Diabetes mellitus due to underlying condition with unspecified complications (HCC) 09/29/2019   Diabetes mellitus without complication (HCC)    DOE (dyspnea on exertion) 10/29/2019   S/p PE  11/2018 > DOAC recurrent 09/14/2019 off DOAC so resumed  - onset variable doe and orthostatic lightheadedness 06/2019 with afib - PFT's [facility]  10/08/2019 :  FEV1 2.79 (73%) with ratio 75 and 6 % better p saba,  Air trapping on lung vol,  ERV 14% and dlco 22.78 (60%) corrects to 3.68 (76%) for vol with minimal concavity to f/v loop  - 10/29/2019   Walked RA x two laps =  approx 523ft @ avg   Essential hypertension 09/29/2019   Fissure in skin of foot 12/11/2016   GERD (gastroesophageal reflux disease) 11/29/2018   Hemospermia  02/22/2020   History of colonic polyps 08/03/2020   History of pulmonary embolism 09/29/2019   Hyperlipidemia    Hypertension    Hypogonadism in male 02/22/2020   IBS (irritable bowel syndrome) 11/29/2018   Impotence 02/22/2020   Incomplete emptying of bladder 02/22/2020   Melena 08/03/2020   Mixed dyslipidemia 09/29/2019   Morbid obesity (HCC) 09/29/2019   Nonalcoholic fatty liver disease 08/03/2020   Open fracture of shaft of metacarpal bone 08/11/2022   OSA (obstructive sleep apnea)    Overgrown toenails 12/11/2016   PAF (paroxysmal atrial fibrillation) (HCC) 02/23/2020   Pain of left hand 08/09/2022   Persistent atrial fibrillation (HCC) 09/29/2019   Plantar fasciitis, bilateral 06/05/2016   S/P ablation of atrial fibrillation 02/23/2020   Secondary hypercoagulable state (HCC) 01/22/2020   Submucosal lesion of stomach 08/03/2020   Upper airway cough syndrome 12/15/2019   Noted on exam 12/15/2019 onset while on Entresto, resolved with purse lip    Past Surgical History:  Procedure Laterality Date   AMPUTATION Left 08/07/2022   Procedure: AMPUTATION FIFTH DIGIT;  Surgeon: Gomez Cleverly, MD;  Location: [facility]  OR;  Service: Orthopedics;  Laterality: Left;   ATRIAL FIBRILLATION ABLATION N/A 12/25/2019   Procedure: ATRIAL FIBRILLATION ABLATION;  Surgeon: Regan Lemming, MD;  Location: [facility];  Service: Cardiovascular;  Laterality: N/A;   ATRIAL FIBRILLATION ABLATION N/A 07/19/2021   Procedure: ATRIAL FIBRILLATION ABLATION;  Surgeon: Regan Lemming, MD;  Location: [facility];  Service: Cardiovascular;  Laterality: N/A;   BIOPSY  08/11/2020   Procedure: BIOPSY;  Surgeon: Rachael Fee, MD;  Location: [facility] ENDOSCOPY;  Service: Endoscopy;;   CARDIAC CATHETERIZATION     CARDIOVERSION N/A 04/17/2021   Procedure: CARDIOVERSION;  Surgeon: Yvonne Kendall, MD;  Location: [facility] ORS;  Service: Cardiovascular;  Laterality: N/A;   ESOPHAGOGASTRODUODENOSCOPY (EGD)  WITH PROPOFOL N/A 08/11/2020   Procedure: ESOPHAGOGASTRODUODENOSCOPY (EGD) WITH PROPOFOL;  Surgeon: Rachael Fee, MD;  Location: [facility] ENDOS[facility];  Service: Endoscopy;  Laterality: N/A;   I & D EXTREMITY Left 08/07/2022   Procedure: IRRIGATION AND DEBRIDEMENT;  Surgeon: Gomez Cleverly, MD;  Location: [facility] OR;  Service: Orthopedics;  Laterality: Left;   KNEE SURGERY     PILONIDAL CYST / SINUS EXCISION     UPPER ESOPHAGEAL ENDOSCOPIC ULTRASOUND (EUS) N/A 08/11/2020   Procedure: UPPER ESOPHAGEAL ENDOSCOPIC ULTRASOUND (EUS);  Surgeon: Rachael Fee, MD;  Location: [facility];  Service: Endoscopy;  Laterality: N/A;    Current Medications: Current Meds  Medication Sig   albuterol (VENTOLIN HFA) 108 (90 Base) MCG/ACT inhaler Inhale 2 puffs into the lungs every 6 (six) hours as needed for wheezing or shortness of breath.    allopurinol (ZYLOPRIM) 100 MG tablet Take 100 mg by mouth in the morning.   Ascorbic Acid (VITAMIN C) 1000 MG tablet Take 1,000 mg by mouth 2 (two) times daily.   atorvastatin (LIPITOR) 20 MG tablet Take 20 mg by mouth daily.   budesonide-formoterol (SYMBICORT) 160-4.5 MCG/ACT inhaler Inhale 1 puff into the lungs daily.   Cholecalciferol (VITAMIN D3) 50 MCG (2000 UT) TABS Take 2,000 Units by mouth 2 (two) times daily.    doxycycline (VIBRAMYCIN) 100 MG capsule Take 100 mg by mouth daily.   ELIQUIS 5 MG TABS tablet Take 1 tablet (5 mg total) by mouth 2 (two) times daily.   empagliflozin (JARDIANCE) 25 MG TABS tablet Take 25 mg by mouth in the morning.   Eszopiclone 3 MG TABS Take 3 mg by mouth at bedtime.   finasteride (PROSCAR) 5 MG tablet Take 5 mg by mouth daily.   flecainide (TAMBOCOR) 100 MG tablet Take 1 tablet (100 mg total) by mouth 2 (two) times daily.   fluticasone (FLONASE) 50 MCG/ACT nasal spray Place 2 sprays into both nostrils daily as needed for allergies or rhinitis.   insulin lispro (HUMALOG) 100 UNIT/ML injection Inject 0-10 Units into the skin 3 (three) times  daily as needed for high blood sugar. Sliding Scale   metoprolol succinate (TOPROL-XL) 50 MG 24 hr tablet Take 1 tablet (50 mg total) by mouth 2 (two) times daily. Take with or immediately following a meal.   montelukast (SINGULAIR) 10 MG tablet Take 10 mg by mouth at bedtime.   Omega-3 Fatty Acids (FISH OIL) 1200 MG CAPS Take 1,200 mg by mouth 2 (two) times daily.    omeprazole (PRILOSEC) 40 MG capsule Take 40 mg by mouth 2 (two) times daily.    rOPINIRole (REQUIP) 4 MG tablet Take 4 mg by mouth daily.   sacubitril-valsartan (ENTRESTO) 49-51 MG Take 1 tablet by mouth 2 (two) times daily.  spironolactone (ALDACTONE) 25 MG tablet Take 1 tablet (25 mg total) by mouth daily.   tamsulosin (FLOMAX) 0.4 MG CAPS capsule Take 0.4 capsules by mouth 2 (two) times daily.   TOUJEO MAX SOLOSTAR 300 UNIT/ML Solostar Pen Inject 128 Units into the skin at bedtime.   venlafaxine XR (EFFEXOR-XR) 150 MG 24 hr capsule Take 150 mg by mouth in the morning.   Vitamin D, Ergocalciferol, (DRISDOL) 1.25 MG (50000 UT) CAPS capsule Take 50,000 Units by mouth every Tuesday. IN THE MORNING     Allergies:   Sitagliptin, Prednisone, Victoza [liraglutide], and Sulfa antibiotics   Social History   Socioeconomic History   Marital status: Married    Spouse name: Not on file   Number of children: Not on file   Years of education: Not on file   Highest education level: Not on file  Occupational History   Not on file  Tobacco Use   Smoking status: Never   Smokeless tobacco: Never  Vaping Use   Vaping status: Never Used  Substance and Sexual Activity   Alcohol use: Not Currently   Drug use: Never   Sexual activity: Not on file  Other Topics Concern   Not on file  Social History Narrative   Not on file   Social Drivers of Health   Financial Resource Strain: Low Risk  (07/11/2022)   Overall Financial Resource Strain (CARDIA)    Difficulty of Paying Living Expenses: Not hard at all  Food Insecurity: No Food  Insecurity (07/11/2022)   Hunger Vital Sign    Worried About Running Out of Food in the Last Year: Never true    Ran Out of Food in the Last Year: Never true  Transportation Needs: No Transportation Needs (07/11/2022)   PRAPARE - Administrator, Civil Service (Medical): No    Lack of Transportation (Non-Medical): No  Physical Activity: Not on file  Stress: Not on file  Social Connections: Not on file     Family History: The patient's family history includes Colon cancer in his father; Hyperlipidemia in his father; Hypertension in his father; Liver cancer in his father; Lung cancer in his father. ROS:   Please see the history of present illness.    All 14 point review of systems negative except as described per history of present illness.  EKGs/Labs/Other Studies Reviewed:    The following studies were reviewed today:     EKG:       Recent Labs: 09/04/2023: BUN 12; Creatinine, Ser 0.94; Magnesium 2.0; NT-Pro BNP 37; Potassium 4.2; Sodium 136  Recent Lipid Panel    Component Value Date/Time   CHOL 135 10/09/2019 0820   TRIG 159 (H) 10/09/2019 0820   HDL 47 10/09/2019 0820   CHOLHDL 2.9 10/09/2019 0820   LDLCALC 61 10/09/2019 0820    Physical Exam:    VS:  BP (!) 140/80 (BP Location: Right Arm, Patient Position: Sitting, Cuff Size: Normal)   Pulse 89   Ht 6\' 2"  (1.88 m)   Wt (!) 308 lb (139.7 kg)   SpO2 91%   BMI 39.54 kg/m     Wt Readings from Last 3 Encounters:  11/28/23 (!) 308 lb (139.7 kg)  09/04/23 (!) 305 lb 3.2 oz (138.4 kg)  08/28/23 (!) 304 lb 3.2 oz (138 kg)     GENERAL:  Well nourished, well developed in no acute distress NECK: No JVD; No carotid bruits CARDIAC: RRR, S1 and S2 present, no murmurs, no rubs, no gallops  CHEST:  Clear to auscultation without rales, wheezing or rhonchi  Extremities: No pitting pedal edema. Pulses bilaterally symmetric with radial 2+ and dorsalis pedis 2+ NEUROLOGIC:  Alert and oriented x 3  Medication  Adjustments/Labs and Tests Ordered: Current medicines are reviewed at length with the patient today.  Concerns regarding medicines are outlined above.  No orders of the defined types were placed in this encounter.  No orders of the defined types were placed in this encounter.   Signed, Raden Byington reddy Harlyn Italiano, MD, MPH, [facility]. 11/28/2023 1:26 PM    [facility]

## 2023-11-28 NOTE — Assessment & Plan Note (Addendum)
From cardiac standpoint he is okay to proceed with elective noncardiac surgery for the knees as needed.  Eliquis can be held as per surgeons protocol typically 2 to 3 days prior to the procedure and resume postprocedure once hemostatic and okay with the surgeon. Would recommend starting low-dose aspirin 81 mg once daily 7 days prior to his surgery for the anticipated interruption to his Eliquis regimen.   If there is anything we can clinical change in terms of his functional capacity or cardiac symptoms prior to surgery please do reach back to health for further preoperative cardiovascular risk assessment.

## 2023-11-28 NOTE — Assessment & Plan Note (Signed)
Asymptomatic. Stable. Currently not on aspirin as he remains on Eliquis for stroke prophylaxis in setting of A-fib. Aware to switch to aspirin if Eliquis is interrupted for any procedures.

## 2023-12-02 ENCOUNTER — Other Ambulatory Visit: Payer: Self-pay | Admitting: Cardiology

## 2023-12-04 ENCOUNTER — Other Ambulatory Visit: Payer: Self-pay | Admitting: Cardiology

## 2023-12-04 DIAGNOSIS — I480 Paroxysmal atrial fibrillation: Secondary | ICD-10-CM

## 2023-12-04 NOTE — Telephone Encounter (Signed)
 Eliquis  5mg  refill request received. Patient is 73 years old, weight-139.7kg, Crea-0.94 on 09/04/23, Diagnosis-Afib, and last seen by Dr. Ronell Coe on 11/28/23. Dose is appropriate based on dosing criteria. Will send in refill to requested pharmacy.

## 2023-12-10 DIAGNOSIS — D649 Anemia, unspecified: Secondary | ICD-10-CM | POA: Diagnosis not present

## 2023-12-10 DIAGNOSIS — D509 Iron deficiency anemia, unspecified: Secondary | ICD-10-CM | POA: Diagnosis not present

## 2023-12-11 ENCOUNTER — Telehealth: Payer: Self-pay

## 2023-12-11 NOTE — Telephone Encounter (Signed)
Message to pt to make appt for Nurse visit per SRM.  Then as follows after EKG:   He has history of atrial fibrillation on rhythm control with flecainide 100 mg twice daily and metoprolol succinate 50 mg once daily.  Follows up with Dr. Elberta Fortis and had ablations previously.   Please have him come in for nurse visit and have an EKG done.  Lets confirm what his heart rate and rhythm is.  If he is having any significant bradycardia, will need to cut down on the dose of flecainide and metoprolol.   If he is doing fine on the EKG in the office, still reduce his dose of metoprolol succinate to 25 mg once daily and set him up for 7-day Zio patch after that.  Thank you

## 2023-12-13 ENCOUNTER — Ambulatory Visit: Payer: Medicare Other

## 2023-12-13 VITALS — BP 120/70 | HR 80 | Ht 74.0 in | Wt 314.0 lb

## 2023-12-13 DIAGNOSIS — I4819 Other persistent atrial fibrillation: Secondary | ICD-10-CM

## 2023-12-13 MED ORDER — METOPROLOL SUCCINATE ER 25 MG PO TB24: 25.0000 mg | ORAL_TABLET | Freq: Every day | ORAL | 3 refills | Status: DC

## 2023-12-13 NOTE — Progress Notes (Signed)
   Nurse Visit   Date of Encounter: 12/13/2023 ID: Sean [name]., DOB 1950-11-06, MRN [id number]  PCP:  Lucianne Lei, MD   [facility] Providers Cardiologist:  Will Jorja Loa, MD Electrophysiologist:  Regan Lemming, MD      Visit Details   VS:  BP 120/70 (BP Location: Left Arm, Patient Position: Sitting, Cuff Size: Normal)   Pulse 80   Ht 6\' 2"  (1.88 m)   Wt (!) 314 lb (142.4 kg)   BMI 40.32 kg/m  , BMI Body mass index is 40.32 kg/m.  Wt Readings from Last 3 Encounters:  12/13/23 (!) 314 lb (142.4 kg)  11/28/23 (!) 308 lb (139.7 kg)  09/04/23 (!) 305 lb 3.2 oz (138.4 kg)     Reason for visit: Perform EKG Performed today: Vitals, EKG, Education and Provider consulted Changes (medications, testing, etc.) : Zio patch for 1 week and Metoprolol succinate 25 mg daily 4.    Length of Visit: 25 minutes    Medications Adjustments/Labs and Tests Ordered: Orders Placed This Encounter  Procedures   LONG TERM MONITOR (3-14 DAYS)   EKG 12-Lead   Meds ordered this encounter  Medications   metoprolol succinate (TOPROL XL) 25 MG 24 hr tablet    Sig: Take 1 tablet (25 mg total) by mouth daily.    Dispense:  90 tablet    Refill:  3     Signed, Samson Frederic, RN  12/13/2023 9:21 AM

## 2023-12-16 DIAGNOSIS — I10 Essential (primary) hypertension: Secondary | ICD-10-CM | POA: Diagnosis not present

## 2023-12-16 DIAGNOSIS — E1169 Type 2 diabetes mellitus with other specified complication: Secondary | ICD-10-CM | POA: Diagnosis not present

## 2023-12-16 DIAGNOSIS — E785 Hyperlipidemia, unspecified: Secondary | ICD-10-CM | POA: Diagnosis not present

## 2023-12-16 DIAGNOSIS — J309 Allergic rhinitis, unspecified: Secondary | ICD-10-CM | POA: Diagnosis not present

## 2023-12-16 DIAGNOSIS — G4700 Insomnia, unspecified: Secondary | ICD-10-CM | POA: Diagnosis not present

## 2023-12-16 DIAGNOSIS — R32 Unspecified urinary incontinence: Secondary | ICD-10-CM | POA: Diagnosis not present

## 2023-12-16 DIAGNOSIS — Z6839 Body mass index (BMI) 39.0-39.9, adult: Secondary | ICD-10-CM | POA: Diagnosis not present

## 2023-12-23 ENCOUNTER — Ambulatory Visit: Payer: Medicare Other | Admitting: Cardiology

## 2023-12-25 DIAGNOSIS — I4819 Other persistent atrial fibrillation: Secondary | ICD-10-CM | POA: Diagnosis not present

## 2024-01-13 DIAGNOSIS — N401 Enlarged prostate with lower urinary tract symptoms: Secondary | ICD-10-CM | POA: Diagnosis not present

## 2024-01-13 DIAGNOSIS — G4700 Insomnia, unspecified: Secondary | ICD-10-CM | POA: Diagnosis not present

## 2024-01-13 DIAGNOSIS — M1990 Unspecified osteoarthritis, unspecified site: Secondary | ICD-10-CM | POA: Diagnosis not present

## 2024-01-13 DIAGNOSIS — E1121 Type 2 diabetes mellitus with diabetic nephropathy: Secondary | ICD-10-CM | POA: Diagnosis not present

## 2024-01-13 DIAGNOSIS — Z6839 Body mass index (BMI) 39.0-39.9, adult: Secondary | ICD-10-CM | POA: Diagnosis not present

## 2024-01-19 NOTE — Progress Notes (Unsigned)
  Electrophysiology Office Note:   Date:  01/19/2024  ID:  Sean Monday., DOB [birth year], MRN [id number]  Primary Cardiologist: Kaily Wragg Jorja Loa, MD Primary Heart Failure: None Electrophysiologist: Kiyana Vazguez Jorja Loa, MD  {Click to update primary MD,subspecialty MD or APP then REFRESH:1}    History of Present Illness:   Sean Primmer. is a 73 y.o. male with h/o atrial fibrillation, chronic systolic heart failure, hypertension seen today for routine electrophysiology followup.   Since last being seen in our clinic the patient reports doing ***.  he denies chest pain, palpitations, dyspnea, PND, orthopnea, nausea, vomiting, dizziness, syncope, edema, weight gain, or early satiety.   Review of systems complete and found to be negative unless listed in HPI.   EP Information / Studies Reviewed:    {EKGtoday:28818}        Risk Assessment/Calculations:    CHA2DS2-VASc Score = 4  {Confirm score is correct.  If not, click here to update score.  REFRESH note.  :1} This indicates a 4.8% annual risk of stroke. The patient's score is based upon: CHF History: 1 HTN History: 1 Diabetes History: 1 Stroke History: 0 Vascular Disease History: 0 Age Score: 1 Gender Score: 0   {This patient has a significant risk of stroke if diagnosed with atrial fibrillation.  Please consider VKA or DOAC agent for anticoagulation if the bleeding risk is acceptable.   You can also use the SmartPhrase .[email or web address] for documentation.   :[id number]}         Physical Exam:   VS:  There were no vitals taken for this visit.   Wt Readings from Last 3 Encounters:  12/13/23 (!) 314 lb (142.4 kg)  11/28/23 (!) 308 lb (139.7 kg)  09/04/23 (!) 305 lb 3.2 oz (138.4 kg)     GEN: Well nourished, well developed in no acute distress NECK: No JVD; No carotid bruits CARDIAC: {EPRHYTHM:28826}, no murmurs, rubs, gallops RESPIRATORY:  Clear to auscultation without rales, wheezing or rhonchi  ABDOMEN: Soft,  non-tender, non-distended EXTREMITIES:  No edema; No deformity   ASSESSMENT AND PLAN:    1.  Persistent atrial fibrillation: Post ablation 10/23/2020 with repeat ablation 07/19/2021.  Currently on Eliquis and flecainide.  He had ablation for atypical atrial flutter but did go back into atrial fibrillation.  His pulmonary veins were isolated at the second ablation.  He is now on flecainide 100 mg twice daily.***  2.  Chronic systolic heart failure: Due to nonischemic cardiomyopathy.  Currently on Entresto and metoprolol.  Ejection fraction has normalized with these medications and maintenance of sinus rhythm.  3.  Hypertension:***  4.  Secondary hypercoagulable state: Currently on Eliquis for atrial fibrillation  5.  High risk medication monitoring: Currently on flecainide.  QRS remains narrow.  He has long first-degree AV block but no dropped beats.  Sean [name] continue monitoring.  Follow up with {NWGNF:62130} {EPFOLLOW QM:57846}  Signed, Qaadir Kent Jorja Loa, MD

## 2024-01-20 ENCOUNTER — Ambulatory Visit: Payer: Medicare Other | Attending: Cardiology | Admitting: Cardiology

## 2024-01-20 ENCOUNTER — Encounter: Payer: Self-pay | Admitting: Cardiology

## 2024-01-20 VITALS — BP 110/66 | HR 92 | Ht 74.0 in | Wt 311.8 lb

## 2024-01-20 DIAGNOSIS — I4819 Other persistent atrial fibrillation: Secondary | ICD-10-CM | POA: Diagnosis not present

## 2024-01-20 DIAGNOSIS — I10 Essential (primary) hypertension: Secondary | ICD-10-CM | POA: Diagnosis not present

## 2024-01-20 DIAGNOSIS — Z79899 Other long term (current) drug therapy: Secondary | ICD-10-CM | POA: Diagnosis not present

## 2024-01-20 DIAGNOSIS — I5022 Chronic systolic (congestive) heart failure: Secondary | ICD-10-CM

## 2024-01-20 DIAGNOSIS — D6869 Other thrombophilia: Secondary | ICD-10-CM

## 2024-01-20 NOTE — Patient Instructions (Signed)
 Medication Instructions:  Your physician has recommended you make the following change in your medication: STOP Metoprolol  *If you need a refill on your cardiac medications before your next appointment, please call your pharmacy*   Lab Work: None ordered  If you have any lab test that is abnormal or we need to change your treatment, we will call you to review the results.   Testing/Procedures: None ordered   Follow-Up: At [facility], you and your health needs are our priority.  As part of our continuing mission to provide you with exceptional heart care, we have created designated Provider Care Teams.  These Care Teams include your primary Cardiologist (physician) and Advanced Practice Providers (APPs -  Physician Assistants and Nurse Practitioners) who all work together to provide you with the care you need, when you need it.    Your next appointment:   6 month(s)  The format for your next appointment:   In Person  Provider:   Loman Brooklyn, MD{   Thank you for choosing [facility]!!   Dory Horn, RN [phone]

## 2024-01-29 DIAGNOSIS — I4819 Other persistent atrial fibrillation: Secondary | ICD-10-CM | POA: Diagnosis not present

## 2024-02-03 ENCOUNTER — Ambulatory Visit: Payer: Medicare Other | Admitting: Cardiology

## 2024-02-03 ENCOUNTER — Encounter: Payer: Self-pay | Admitting: Cardiology

## 2024-02-12 DIAGNOSIS — Z6839 Body mass index (BMI) 39.0-39.9, adult: Secondary | ICD-10-CM | POA: Diagnosis not present

## 2024-02-12 DIAGNOSIS — M159 Polyosteoarthritis, unspecified: Secondary | ICD-10-CM | POA: Diagnosis not present

## 2024-02-12 DIAGNOSIS — E1169 Type 2 diabetes mellitus with other specified complication: Secondary | ICD-10-CM | POA: Diagnosis not present

## 2024-02-12 DIAGNOSIS — I4891 Unspecified atrial fibrillation: Secondary | ICD-10-CM | POA: Diagnosis not present

## 2024-02-12 DIAGNOSIS — E669 Obesity, unspecified: Secondary | ICD-10-CM | POA: Diagnosis not present

## 2024-02-12 DIAGNOSIS — E559 Vitamin D deficiency, unspecified: Secondary | ICD-10-CM | POA: Diagnosis not present

## 2024-02-12 DIAGNOSIS — J309 Allergic rhinitis, unspecified: Secondary | ICD-10-CM | POA: Diagnosis not present

## 2024-02-12 DIAGNOSIS — G4700 Insomnia, unspecified: Secondary | ICD-10-CM | POA: Diagnosis not present

## 2024-02-15 ENCOUNTER — Encounter (HOSPITAL_COMMUNITY): Payer: Self-pay

## 2024-02-15 ENCOUNTER — Other Ambulatory Visit: Payer: Self-pay

## 2024-02-15 ENCOUNTER — Emergency Department (HOSPITAL_COMMUNITY)
Admission: EM | Admit: 2024-02-15 | Discharge: 2024-02-15 | Disposition: A | Discharge status: Home / Self Care | Attending: Emergency Medicine | Admitting: Emergency Medicine

## 2024-02-15 DIAGNOSIS — I509 Heart failure, unspecified: Secondary | ICD-10-CM | POA: Insufficient documentation

## 2024-02-15 DIAGNOSIS — E119 Type 2 diabetes mellitus without complications: Secondary | ICD-10-CM | POA: Diagnosis not present

## 2024-02-15 DIAGNOSIS — L03116 Cellulitis of left lower limb: Secondary | ICD-10-CM | POA: Insufficient documentation

## 2024-02-15 DIAGNOSIS — M79605 Pain in left leg: Secondary | ICD-10-CM | POA: Diagnosis present

## 2024-02-15 DIAGNOSIS — I110 Hypertensive heart disease with heart failure: Secondary | ICD-10-CM | POA: Insufficient documentation

## 2024-02-15 LAB — COMPREHENSIVE METABOLIC PANEL WITH GFR
ALT: 47 U/L — ABNORMAL HIGH (ref 0–44)
AST: 40 U/L (ref 15–41)
Albumin: 3.6 g/dL (ref 3.5–5.0)
Alkaline Phosphatase: 57 U/L (ref 38–126)
Anion gap: 10 (ref 5–15)
BUN: 15 mg/dL (ref 8–23)
CO2: 23 mmol/L (ref 22–32)
Calcium: 9.1 mg/dL (ref 8.9–10.3)
Chloride: 104 mmol/L (ref 98–111)
Creatinine, Ser: 1.22 mg/dL (ref 0.61–1.24)
GFR, Estimated: >60 mL/min (ref >60)
Glucose, Bld: 154 mg/dL — ABNORMAL HIGH (ref 70–99)
Potassium: 4.2 mmol/L (ref 3.5–5.1)
Sodium: 137 mmol/L (ref 135–145)
Total Bilirubin: 0.9 mg/dL (ref 0.0–1.2)
Total Protein: 7.2 g/dL (ref 6.5–8.1)

## 2024-02-15 LAB — CBC WITH DIFFERENTIAL/PLATELET
Abs Immature Granulocytes: 0.03 10*3/uL (ref 0.00–0.07)
Basophils Absolute: 0 10*3/uL (ref 0.0–0.1)
Basophils Relative: 0 %
Eosinophils Absolute: 0.2 10*3/uL (ref 0.0–0.5)
Eosinophils Relative: 3 %
HCT: 45.4 % (ref 39.0–52.0)
Hemoglobin: 14.9 g/dL (ref 13.0–17.0)
Immature Granulocytes: 0 %
Lymphocytes Relative: 24 %
Lymphs Abs: 1.7 10*3/uL (ref 0.7–4.0)
MCH: 31.7 pg (ref 26.0–34.0)
MCHC: 32.8 g/dL (ref 30.0–36.0)
MCV: 96.6 fL (ref 80.0–100.0)
Monocytes Absolute: 0.7 10*3/uL (ref 0.1–1.0)
Monocytes Relative: 9 %
Neutro Abs: 4.6 10*3/uL (ref 1.7–7.7)
Neutrophils Relative %: 64 %
Platelets: 202 10*3/uL (ref 150–400)
RBC: 4.7 MIL/uL (ref 4.22–5.81)
RDW: 15.4 % (ref 11.5–15.5)
WBC: 7.2 10*3/uL (ref 4.0–10.5)
nRBC: 0 % (ref 0.0–0.2)

## 2024-02-15 LAB — CBG MONITORING, ED
Glucose-Capillary: 88 mg/dL (ref 70–99)
Glucose-Capillary: 76 mg/dL (ref 70–99)

## 2024-02-15 MED ORDER — DOXYCYCLINE HYCLATE 100 MG PO TABS
100.0000 mg | ORAL_TABLET | Freq: Once | ORAL | Status: AC
  Administered 2024-02-15: 100 mg via ORAL
  Filled 2024-02-15: qty 1

## 2024-02-15 MED ORDER — DOXYCYCLINE HYCLATE 100 MG PO CAPS: 100.0000 mg | ORAL_CAPSULE | Freq: Two times a day (BID) | ORAL | 0 refills | Status: DC

## 2024-02-15 MED ORDER — DOXYCYCLINE HYCLATE 100 MG PO CAPS
100.0000 mg | ORAL_CAPSULE | Freq: Two times a day (BID) | ORAL | 0 refills | Status: AC
Start: 2024-02-15 — End: 2024-02-25

## 2024-02-15 NOTE — Discharge Instructions (Signed)
 You were evaluated in the Emergency Department and after careful evaluation, we did not find any emergent condition requiring admission or further testing in the hospital.  Your exam/testing today was overall reassuring.  Symptoms likely due to cellulitis.  Take the doxycycline  as directed, use tylenol  for pain.  Please return to the Emergency Department if you experience any worsening of your condition.  Thank you for allowing us  to be a part of your care.

## 2024-02-15 NOTE — ED Provider Triage Note (Signed)
 Emergency Medicine Provider Triage Evaluation Note  Sean [name]. , a 73 y.o. male  was evaluated in triage.  Pt complains of leg swelling.  Patient reports several days of left leg swelling with a scab present.  Concerned for possible infection as there is redness surrounding the scabbed area.  Denies any recent animal or insect bites.  Patient is a type II diabetic with self-reported well-controlled glucose levels.  He does report a prior history of a DVT in the left lower extremity but states that this does not feel similar.  He is on a blood thinner.  Review of Systems  Positive: As above Negative: As above  Physical Exam  BP 125/65   Pulse (!) 102   Temp 98.3 F (36.8 C)   Resp 17   Ht 6\' 2"  (1.88 m)   Wt (!) 138.3 kg   SpO2 95%   BMI 39.16 kg/m  Gen:   Awake, no distress   Resp:  Normal effort  MSK:   Moves extremities without difficulty  Other:  Small lesion present to the medial calf of the left lower extremity.  There is erythema and induration present.  No calf tenderness.  Medical Decision Making  Medically screening exam initiated at 2:19 PM.  Appropriate orders placed.  Sean [name]. was informed that the remainder of the evaluation will be completed by another provider, this initial triage assessment does not replace that evaluation, and the importance of remaining in the ED until their evaluation is complete.     Sean [name] A, PA-C 02/15/24 1420

## 2024-02-15 NOTE — ED Provider Notes (Signed)
 [facility] DEPT [facility] Emergency Department Provider Note MRN:  [id number]  Arrival date & time: 02/15/24     Chief Complaint   Leg Swelling   History of Present Illness   Sean [name]. is a 73 y.o. year-old male with a history of A-fib, diabetes, obesity presenting to the ED with chief complaint of leg pain.  Pain redness and swelling to the left medial calf.  Noticed it 2 days ago.  There is a scratch and a scab in the center of the area in question with surrounding redness and bruising.  He does not recall injuring the calf or scratching it.  Denies fever, no other complaints.  Review of Systems  A thorough review of systems was obtained and all systems are negative except as noted in the HPI and PMH.   Patient's Health History    Past Medical History:  Diagnosis Date   Abdominal bruit 10/23/2022   Allergic rhinitis    Arrhythmia    Atrial fibrillation (HCC)    Atrial flutter (HCC)    Barrett's esophagus 08/03/2020   Benign prostatic hyperplasia with urinary obstruction 11/29/2018   Cardiomyopathy (HCC) 02/23/2020   CHF (congestive heart failure) (HCC)    Closed fracture of fifth metacarpal bone of left hand 08/11/2022   Diabetes mellitus due to underlying condition with unspecified complications (HCC) 09/29/2019   Diabetes mellitus without complication (HCC)    DOE (dyspnea on exertion) 10/29/2019   S/p PE  11/2018 > DOAC recurrent 09/14/2019 off DOAC so resumed  - onset variable doe and orthostatic lightheadedness 06/2019 with afib - PFT's [name]  10/08/2019 :  FEV1 2.79 (73%) with ratio 75 and 6 % better p saba,  Air trapping on lung vol,  ERV 14% and dlco 22.78 (60%) corrects to 3.68 (76%) for vol with minimal concavity to f/v loop  - 10/29/2019   Walked RA x two laps =  approx 554ft @ avg   Essential hypertension 09/29/2019   Fissure in skin of foot 12/11/2016   GERD (gastroesophageal reflux disease) 11/29/2018   Hemospermia 02/22/2020   History of  colonic polyps 08/03/2020   History of pulmonary embolism 09/29/2019   Hyperlipidemia    Hypertension    Hypogonadism in male 02/22/2020   IBS (irritable bowel syndrome) 11/29/2018   Impotence 02/22/2020   Incomplete emptying of bladder 02/22/2020   Melena 08/03/2020   Mixed dyslipidemia 09/29/2019   Morbid obesity (HCC) 09/29/2019   Nonalcoholic fatty liver disease 08/03/2020   Open fracture of shaft of metacarpal bone 08/11/2022   OSA (obstructive sleep apnea)    Overgrown toenails 12/11/2016   PAF (paroxysmal atrial fibrillation) (HCC) 02/23/2020   Pain of left hand 08/09/2022   Persistent atrial fibrillation (HCC) 09/29/2019   Plantar fasciitis, bilateral 06/05/2016   S/P ablation of atrial fibrillation 02/23/2020   Secondary hypercoagulable state (HCC) 01/22/2020   Submucosal lesion of stomach 08/03/2020   Upper airway cough syndrome 12/15/2019   Noted on exam 12/15/2019 onset while on Entresto , resolved with purse lip    Past Surgical History:  Procedure Laterality Date   AMPUTATION Left 08/07/2022   Procedure: AMPUTATION FIFTH DIGIT;  Surgeon: Ltanya Rummer, MD;  Location: [facility] OR;  Service: Orthopedics;  Laterality: Left;   ATRIAL FIBRILLATION ABLATION N/A 12/25/2019   Procedure: ATRIAL FIBRILLATION ABLATION;  Surgeon: Lei Pump, MD;  [facility] INV[facility];  Service: Cardiovascular;  Laterality: N/A;   ATRIAL FIBRILLATION ABLATION N/A 07/19/2021   Procedure: ATRIAL FIBRILLATION ABLATION;  Surgeon:  Lei Pump, MD;  [facility]: [facility];  Service: Cardiovascular;  Laterality: N/A;   BIOPSY  08/11/2020   Procedure: BIOPSY;  Surgeon: Janel Medford, MD;  Location: [facility] ENDOSCOPY;  Service: Endoscopy;;   CARDIAC CATHETERIZATION     CARDIOVERSION N/A 04/17/2021   Procedure: CARDIOVERSION;  Surgeon: Sammy Crisp, MD;  Location: [facility] ORS;  Service: Cardiovascular;  Laterality: N/A;   ESOPHAGOGASTRODUODENOSCOPY (EGD) WITH PROPOFOL  N/A 08/11/2020    Procedure: ESOPHAGOGASTRODUODENOSCOPY (EGD) WITH PROPOFOL ;  Surgeon: Janel Medford, MD;  Location: [facility] ENDOSCOPY;  Service: Endoscopy;  Laterality: N/A;   I & D EXTREMITY Left 08/07/2022   Procedure: IRRIGATION AND DEBRIDEMENT;  Surgeon: Ltanya Rummer, MD;  Location: [facility] OR;  Service: Orthopedics;  Laterality: Left;   KNEE SURGERY     PILONIDAL CYST / SINUS EXCISION     UPPER ESOPHAGEAL ENDOSCOPIC ULTRASOUND (EUS) N/A 08/11/2020   Procedure: UPPER ESOPHAGEAL ENDOSCOPIC ULTRASOUND (EUS);  Surgeon: Janel Medford, MD;  Location: [facility];  Service: Endoscopy;  Laterality: N/A;    Family History  Problem Relation Age of Onset   Hyperlipidemia Father    Hypertension Father    Colon cancer Father    Liver cancer Father    Lung cancer Father     Social History   Socioeconomic History   Marital status: Married    Spouse name: Not on file   Number of children: Not on file   Years of education: Not on file   Highest education level: Not on file  Occupational History   Not on file  Tobacco Use   Smoking status: Never   Smokeless tobacco: Never  Vaping Use   Vaping status: Never Used  Substance and Sexual Activity   Alcohol  use: Not Currently   Drug use: Never   Sexual activity: Not on file  Other Topics Concern   Not on file  Social History Narrative   Not on file   Social Drivers of Health   Financial Resource Strain: Low Risk  (07/11/2022)   Overall Financial Resource Strain (CARDIA)    Difficulty of Paying Living Expenses: Not hard at all  Food Insecurity: No Food Insecurity (07/11/2022)   Hunger Vital Sign    Worried About Running Out of Food in the Last Year: Never true    Ran Out of Food in the Last Year: Never true  Transportation Needs: No Transportation Needs (07/11/2022)   PRAPARE - Administrator, Civil Service (Medical): No    Lack of Transportation (Non-Medical): No  Physical Activity: Not on file  Stress: Not on file  Social Connections:  Not on file  Intimate Partner Violence: Not on file     Physical Exam   Vitals:   02/15/24 1356 02/15/24 1729  BP: 125/65 131/75  Pulse: (!) 102 80  Resp: 17 17  Temp: 98.3 F (36.8 C) 98.5 F (36.9 C)  SpO2: 95% 98%    CONSTITUTIONAL: Well-appearing, NAD NEURO/PSYCH:  Alert and oriented x 3, no focal deficits EYES:  eyes equal and reactive ENT/NECK:  no LAD, no JVD CARDIO: Regular rate, well-perfused, normal S1 and S2 PULM:  CTAB no wheezing or rhonchi GI/GU:  non-distended, non-tender MSK/SPINE:  No gross deformities, no edema SKIN: Linear healing abrasion to the left medial calf with surrounding erythema, small amount of bruising.   *Additional and/or pertinent findings included in MDM below  Diagnostic and Interventional Summary    EKG Interpretation Date/Time:    Ventricular Rate:  PR Interval:    QRS Duration:    QT Interval:    QTC Calculation:   R Axis:      Text Interpretation:         Labs Reviewed  COMPREHENSIVE METABOLIC PANEL WITH GFR - Abnormal; Notable for the following components:      Result Value   Glucose, Bld 154 (*)    ALT 47 (*)    All other components within normal limits  CBC WITH DIFFERENTIAL/PLATELET  CBG MONITORING, ED  CBG MONITORING, ED    No orders to display    Medications  doxycycline  (VIBRA -TABS) tablet 100 mg (has no administration in time range)     Procedures  /  Critical Care Procedures  ED Course and Medical Decision Making  Initial Impression and Ddx Seems consistent with a case of cellulitis.  With thorough palpation there is no fluctuance, but does not seem to be significant tenderness, patient is very nontoxic with normal vitals, no fever, no systemic symptoms.  Overall doubt DVT given the appearance, the more localized site of erythema and the fact that patient is already anticoagulated.  Past medical/surgical history that increases complexity of ED encounter: History of DVT on  anticoagulation  Interpretation of Diagnostics I personally reviewed the Laboratory Testing and my interpretation is as follows: No significant blood count or electrolyte disturbance.    Patient Reassessment and Ultimate Disposition/Management     Patient is appropriate for discharge and antibiotics, return precautions provided.  Patient management required discussion with the following services or consulting groups:  None  Complexity of Problems Addressed Acute illness or injury that poses threat of life of bodily function  Additional Data Reviewed and Analyzed Further history obtained from: Further history from spouse/family member  Additional Factors Impacting ED Encounter Risk Prescriptions  Merrick Abe. Harless Lien, MD [facility] Emergency Medicine [facility] [email or web address]  Final Clinical Impressions(s) / ED Diagnoses     ICD-10-CM   1. Cellulitis of left lower extremity  L03.116       ED Discharge Orders          Ordered    doxycycline  (VIBRAMYCIN ) 100 MG capsule  2 times daily        02/15/24 2318             Discharge Instructions Discussed with and Provided to Patient:    Discharge Instructions      You were evaluated in the Emergency Department and after careful evaluation, we did not find any emergent condition requiring admission or further testing in the hospital.  Your exam/testing today was overall reassuring.  Symptoms likely due to cellulitis.  Take the doxycycline  as directed, use tylenol  for pain.  Please return to the Emergency Department if you experience any worsening of your condition.  Thank you for allowing us  to be a part of your care.       Edson Graces, MD 02/15/24 2329

## 2024-02-15 NOTE — ED Triage Notes (Signed)
 Pt has scab on his left calf, unknown how it occurred but there is now swelling and bruising noted around it. Pt noticed it 2 days ago. Pt does take a blood thinner.

## 2024-02-15 NOTE — ED Notes (Signed)
 CBG 76. Pt given [place] sandwich and diet sprite.

## 2024-02-20 DIAGNOSIS — E1121 Type 2 diabetes mellitus with diabetic nephropathy: Secondary | ICD-10-CM | POA: Diagnosis not present

## 2024-02-20 DIAGNOSIS — Z09 Encounter for follow-up examination after completed treatment for conditions other than malignant neoplasm: Secondary | ICD-10-CM | POA: Diagnosis not present

## 2024-02-20 DIAGNOSIS — L03116 Cellulitis of left lower limb: Secondary | ICD-10-CM | POA: Diagnosis not present

## 2024-02-20 DIAGNOSIS — Z6839 Body mass index (BMI) 39.0-39.9, adult: Secondary | ICD-10-CM | POA: Diagnosis not present

## 2024-02-25 ENCOUNTER — Ambulatory Visit: Admitting: Internal Medicine

## 2024-02-25 ENCOUNTER — Encounter: Payer: Self-pay | Admitting: Internal Medicine

## 2024-02-25 VITALS — BP 119/74 | HR 79 | Temp 98.0°F | Ht 74.0 in | Wt 313.0 lb

## 2024-02-25 DIAGNOSIS — G4733 Obstructive sleep apnea (adult) (pediatric): Secondary | ICD-10-CM | POA: Diagnosis not present

## 2024-02-25 DIAGNOSIS — I4891 Unspecified atrial fibrillation: Secondary | ICD-10-CM | POA: Diagnosis not present

## 2024-02-25 NOTE — Patient Instructions (Signed)
 Order- DME [employer]   please continue CPAP current settings, replace mask of choice, heated humidification, supplies, and provide current download.

## 2024-02-25 NOTE — Progress Notes (Signed)
 HPI- M never smoker followed for OSA, complicated by A. Fib/ ablation, HTN, CHF, CM, Allergic Rhinitis, NASH, GERD/Barrett's, DM2, BPH, Morbid Obesity, COVID Infection 2022 NPSG 06/23/21- CPAP titrated to 16, body weight 300 lbs. Titration started at 13 cwp      ( AHI 16/ hr). Minimum O2 saturation on 16 cwp was 89% PFT [name]) 10/08/19- minimal obstruction, no response to BD, moderate Diffusion deficit ==================================================================   11/17/21- 70yoM never smoker followed for OSA, complicated by A. Fib/ ablation, HTN, CHF, CM, Allergic Rhinitis, NASH, GERD/Barrett's, DM2, BPH, Morbid Obesity, COVID Infection 2022 -Neb albuterol , Ventolin  hfa,  CPAP auto 10-20 Dream Station 2/ [employer]  Download-pending Body weight today-310 lbs Covid vax-3 Moderna Flu vax- had -----Patient states that he is having issues with his machine, feels like he is not getting enough air.  We have contacted his DME company about getting download.  He is pretty clear that his new DreamStation machine has not been functioning the same for about a month.  Less airflow. Most recent cardiac ablation about a month ago seems to be working so far.  02/25/24- 72yoM never smoker followed for OSA, complicated by A. Fib/ ablation, HTN, CHF, CM, Allergic Rhinitis, NASH, GERD/Barrett's, DM2, BPH, Morbid Obesity, COVID Infection 2022 -Neb albuterol , Ventolin  hfa,  CPAP auto 10-20 Dream Station 2/ [employer]  Download- Body weight today- Sleep in good.  Using CPAP nightly.  Needs new rx for CPAP supplies. DME is [employer]. Discussed the use of AI scribe software for clinical note transcription with the patient, who gave verbal consent to proceed.  History of Present Illness   Sean [name]. "Sean [name]" is a 73 year old male with sleep apnea who presents for CPAP supply issues and follow-up.  He is experiencing issues with his CPAP supplies due to a lack of recent communication from his home care  company, [employer]. His CPAP machine was replaced following a recall a few years ago. He uses the machine every night and during naps, stating that 'life is better off with it than without it.' He is currently using a nasal mask, although his preferred mask style was discontinued. He has not received notifications from [employer] recently and discovered that he requires a new prescription, which has not been updated in two years.  He has been with [employer] for several years, but there have been issues with record-keeping due to discrepancies in his name between insurance and Medicare. He has had a CPAP machine for many years and underwent a CPAP titration study in 2022. He recalls having a diagnostic sleep study when he moved to the area three to four years ago, but records may not be readily available.   Download indicates average usage 10 hours, AHI 5.8/hr. Used every night. DreamStation 2.   He continues Eliquis  for hx PAFib, s/p ablation x 2.     Assessment and Plan:    Obstructive sleep apnea Continues to benefit from CPAP with good compliance and control Plan- continue current settings -Update needed supplies   Atrial Fibrillation Episodes post-vaccination, reverted to sinus rhythm after two ablations. On Eliquis  for anticoagulation. - Continue Eliquis  for anticoagulation.     ROS-see HPI   + = positive Constitutional:    weight loss, night sweats, fevers, chills, fatigue, lassitude. HEENT:    headaches, difficulty swallowing, tooth/dental problems, sore throat,       sneezing, itching, ear ache, nasal congestion, post nasal drip, snoring CV:    chest pain, orthopnea, PND, swelling in lower extremities, anasarca,  dizziness, palpitations Resp:   +shortness of breath with exertion or at rest.                productive cough,   non-productive cough, coughing up of blood.              change in color of mucus.  wheezing.   Skin:    rash or  lesions. GI:  No-   heartburn, indigestion, abdominal pain, nausea, vomiting, diarrhea,                 change in bowel habits, loss of appetite GU: dysuria, change in color of urine, no urgency or frequency.   flank pain. MS:   joint pain, stiffness, decreased range of motion, back pain. Neuro-     nothing unusual Psych:  change in mood or affect.  depression or anxiety.   memory loss.  OBJ- Physical Exam General- Alert, Oriented, Affect-appropriate, Distress- none acute, + obese Skin- rash-none, lesions- none, excoriation+ mild on forearms Lymphadenopathy- none Head- atraumatic            Eyes- Gross vision intact, PERRLA, conjunctivae and secretions clear            Ears- Hearing, canals-normal            Nose- Clear, no-Septal dev, mucus, polyps, erosion, perforation             Throat- Mallampati III-IV , mucosa clear , drainage- none, tonsils- atrophic Neck- flexible , trachea midline, no stridor , thyroid  nl, carotid no bruit Chest - symmetrical excursion , unlabored           Heart/CV- RR/ AFib , no murmur , no gallop  , no rub, nl s1 s2                           - JVD- none , edema- none, stasis changes- none, varices- none           Lung- clear to P&A, wheeze- none, cough- none , dullness-none, rub- none           Chest wall-  Abd-  Br/ Gen/ Rectal- Not done, not indicated Extrem- cyanosis- none, clubbing, none, atrophy- none, strength- nl Neuro- grossly intact to observation

## 2024-02-28 ENCOUNTER — Telehealth: Payer: Self-pay | Admitting: Internal Medicine

## 2024-02-28 NOTE — Telephone Encounter (Signed)
 Cmn received from [employer] for PAP supplies.

## 2024-03-05 NOTE — Telephone Encounter (Signed)
 CMN faxed successfully and signed.

## 2024-03-18 DIAGNOSIS — E1169 Type 2 diabetes mellitus with other specified complication: Secondary | ICD-10-CM | POA: Diagnosis not present

## 2024-03-18 DIAGNOSIS — J309 Allergic rhinitis, unspecified: Secondary | ICD-10-CM | POA: Diagnosis not present

## 2024-03-18 DIAGNOSIS — I10 Essential (primary) hypertension: Secondary | ICD-10-CM | POA: Diagnosis not present

## 2024-03-18 DIAGNOSIS — R32 Unspecified urinary incontinence: Secondary | ICD-10-CM | POA: Diagnosis not present

## 2024-03-18 DIAGNOSIS — E1121 Type 2 diabetes mellitus with diabetic nephropathy: Secondary | ICD-10-CM | POA: Diagnosis not present

## 2024-03-18 DIAGNOSIS — Z6839 Body mass index (BMI) 39.0-39.9, adult: Secondary | ICD-10-CM | POA: Diagnosis not present

## 2024-03-18 DIAGNOSIS — E785 Hyperlipidemia, unspecified: Secondary | ICD-10-CM | POA: Diagnosis not present

## 2024-03-31 ENCOUNTER — Other Ambulatory Visit: Payer: Self-pay

## 2024-04-01 ENCOUNTER — Ambulatory Visit

## 2024-04-01 VITALS — BP 124/70 | HR 88 | Ht 74.0 in | Wt 308.4 lb

## 2024-04-01 DIAGNOSIS — I4819 Other persistent atrial fibrillation: Secondary | ICD-10-CM

## 2024-04-01 DIAGNOSIS — I2510 Atherosclerotic heart disease of native coronary artery without angina pectoris: Secondary | ICD-10-CM

## 2024-04-01 DIAGNOSIS — I5032 Chronic diastolic (congestive) heart failure: Secondary | ICD-10-CM | POA: Diagnosis not present

## 2024-04-01 MED ORDER — METOPROLOL SUCCINATE ER 25 MG PO TB24: 12.5000 mg | ORAL_TABLET | Freq: Every day | ORAL | 3 refills | Status: AC

## 2024-04-01 MED ORDER — FUROSEMIDE 40 MG PO TABS: 40.0000 mg | ORAL_TABLET | ORAL | 3 refills | Status: AC

## 2024-04-01 NOTE — Progress Notes (Signed)
 Cardiology Consultation:    Date:  04/01/2024   ID:  Sean Flirt., DOB Mar 16, 1951, MRN [id number]  PCP:  Tamela Fake, MD  Cardiologist:  Daymon Evans Italo Banton, MD   Referring MD: Tamela Fake, MD   No chief complaint on file.    ASSESSMENT AND PLAN:   Mr. Pelzer 73 year old male with history of nonischemic cardiomyopathy with recovered LVEF and chronic diastolic heart failure, persistent atrial fibrillation s/p ablations [December 2021 and repeat ablation September 2022] and on rhythm control with flecainide , coronary atherosclerosis with calcium  score 65 on preablation cardiac CT September 2022 [prior cardiac CT May 2021 without any obstructive disease], hypertension, hyperlipidemia, obesity, diabetes mellitus, obstructive sleep apnea, BPH, osteoarthritis of both knees and chronic back pain from lumbar spinal stenosis. Last echocardiogram to review from 11-29-2023 noted normal LVEF 60 to 65% no significant valve abnormalities.  Here for follow-up visit. Advised him to avoid medications such as Reglan given his antiarrhythmic use.  Problem List Items Addressed This Visit     CHF (congestive heart failure) (HCC)   Recovered LVEF from 35 to 40% in November 2019. Last echocardiogram January 2025 with EF 60 to 65%, normal RV function. Euvolemic and compensated. Trace bilateral ankle edema. Continue with salt restriction to below 2 g/day. Continue with furosemide  40 mg 2 times a week.  Continue guideline directed medical therapy. Resume metoprolol  succinate at a small dose 12.5 mg once daily. Continue Entresto  49 mg / 51 mg twice daily Continue spironolactone  25 mg once daily Continue Jardiance, on diabetic dose 25 mg once daily as prescribed by his PCP.       Persistent atrial fibrillation (HCC) - Primary   Remains in sinus rhythm. History of ablation December 2021 and redo ablation in September 2022.  Remains on rhythm control with flecainide  100 mg twice daily. Metoprolol   succinate was recently discontinued due to slow heart rates by Dr. Lawana Pray in March 2025.  He has noticed significantly elevated heart rates at home up to 110/min in sinus rhythm, confirmed using his smart watch.  He did not have significant bradycardia episodes on heart monitor in February with slowest heart rate sinus rhythm around 40s.  Will reintroduce metoprolol  succinate 12.5 mg once daily so as to keep it in conjunction with his ongoing therapy with flecainide .  Continue anticoagulation with Eliquis  5 mg twice daily in the setting of CHA2DS2-VASc score of 4.       Relevant Orders   EKG 12-Lead (Completed)   Coronary atherosclerosis calcium  score 65 on CT coronary angiogram September 2022; prior cardiac CT 03/17/2020 no obstructive disease   Asymptomatic. Good functional status. Not on aspirin as he remains on Eliquis  for A-fib. Continue with atorvastatin  20 mg once daily.  Last lipid panel from November 14, 2023 LDL 49, HDL 42, total cholesterol 409 triglycerides 125.       Return to clinic in 6 months.   History of Present Illness:    Sean [name]. is a 73 y.o. male who is being seen today for follow-up visit. PCP is Tamela Fake, MD. Last visit with me in the office was 11-28-2023.  History of nonischemic cardiomyopathy with recovered LVEF and chronic diastolic heart failure, persistent atrial fibrillation s/p ablations [December 2021 and repeat ablation September 2022] and on rhythm control with flecainide , coronary atherosclerosis with calcium  score 65 on preablation cardiac CT September 2022 [prior cardiac CT May 2021 without any obstructive disease], hypertension, hyperlipidemia, obesity, diabetes mellitus, obstructive sleep apnea, BPH, osteoarthritis of both  knees and chronic back pain from lumbar spinal stenosis. Last echocardiogram to review from 11-29-2023 noted normal LVEF 60 to 65% no significant valve abnormalities  9-day Zio patch monitor from 12-13-2023 noted  no evidence of atrial fibrillation.  Supraventricular and ventricular ectopy burden less than 1%.  Average heart rate was 72/min [ranging from 49 to 125 bpm]  Since his last visit with me he had follow-up with Dr. Lawana Pray 01-20-2024 and was recommended to discuss metoprolol  in the setting of bradycardia.  He was continued on flecainide  for rhythm control 100 mg twice daily.  And plan to follow-up with him in the office in 6 months.  He also had a visit to the ER 02/15/2024 for lower extremity cellulitis.  Here for the visit today accompanied by his wife.  Pleasant gentleman mentions overall he is doing well.  Planning on a trip to [place]  for casino visit. Mentions at home he has noticed heart rates to be little bit higher than usual and at times up to 110 bpm and checking with his Apple smart watch he noted it to be normal sinus rhythm but slightly faster heart rates. This makes him feel slightly uncomfortable and tired but denies any chest pain, palpitations, syncopal or near syncopal episodes.  Mild bilateral lower extremity edema slightly worse on left compared to right. Has not been taking furosemide  consistently.  Uses a 6 on an as-needed basis if he has noticeable swelling in the legs.  Denies any blood in urine or stools. Does report easy bruisability.   EKG in the clinic today shows sinus rhythm heart rate 88/min with prolonged PR interval 302 ms, QRS duration 110 ms, no significant ST-T changes to suggest ischemia.  Anteroseptal Q waves present.  No significant change in comparison to prior EKG from 12-13-2023.  Blood work from 02/15/2024 in the ER noted BUN 15, creatinine 1.22, EGFR greater than 60. ALT mildly elevated 47.  AST and alkaline phosphatase normal. CBC unremarkable with hemoglobin 14.9 and hematocrit 45 point   Past Medical History:  Diagnosis Date   Abdominal bruit 10/23/2022   Allergic rhinitis    Arrhythmia    Atrial fibrillation (HCC)    Atrial  flutter (HCC)    Barrett's esophagus 08/03/2020   Benign prostatic hyperplasia with urinary obstruction 11/29/2018   BMI 39.0-39.9,adult 08/28/2023   Cardiomyopathy (HCC) 02/23/2020   CHF (congestive heart failure) (HCC)    Closed fracture of fifth metacarpal bone of left hand 08/11/2022   Coronary atherosclerosis calcium  score 65 on CT coronary angiogram September 2022; prior cardiac CT 03/17/2020 no obstructive disease 08/28/2023   Diabetes mellitus due to underlying condition with unspecified complications (HCC) 09/29/2019   Diabetes mellitus without complication (HCC)    DOE (dyspnea on exertion) 10/29/2019   S/p PE  11/2018 > DOAC recurrent 09/14/2019 off DOAC so resumed  - onset variable doe and orthostatic lightheadedness 06/2019 with afib - PFT's [name]  10/08/2019 :  FEV1 2.79 (73%) with ratio 75 and 6 % better p saba,  Air trapping on lung vol,  ERV 14% and dlco 22.78 (60%) corrects to 3.68 (76%) for vol with minimal concavity to f/v loop  - 10/29/2019   Walked RA x two laps =  approx 573ft @ avg   Essential hypertension 09/29/2019   Fissure in skin of foot 12/11/2016   GERD (gastroesophageal reflux disease) 11/29/2018   Hemospermia 02/22/2020   History of colonic polyps 08/03/2020   History of pulmonary embolism 09/29/2019   Hyperlipidemia  Hypertension    Hypogonadism in male 02/22/2020   IBS (irritable bowel syndrome) 11/29/2018   Impotence 02/22/2020   Incomplete emptying of bladder 02/22/2020   Melena 08/03/2020   Mixed dyslipidemia 09/29/2019   Morbid obesity (HCC) 09/29/2019   Nonalcoholic fatty liver disease 08/03/2020   Open fracture of shaft of metacarpal bone 08/11/2022   OSA (obstructive sleep apnea)    Overgrown toenails 12/11/2016   PAF (paroxysmal atrial fibrillation) (HCC) 02/23/2020   Pain of left hand 08/09/2022   Persistent atrial fibrillation (HCC) 09/29/2019   Plantar fasciitis, bilateral 06/05/2016   Preoperative cardiovascular examination  11/28/2023   S/P ablation of atrial fibrillation 02/23/2020   Secondary hypercoagulable state (HCC) 01/22/2020   Submucosal lesion of stomach 08/03/2020   Upper airway cough syndrome 12/15/2019   Noted on exam 12/15/2019 onset while on Entresto , resolved with purse lip    Past Surgical History:  Procedure Laterality Date   AMPUTATION Left 08/07/2022   Procedure: AMPUTATION FIFTH DIGIT;  Surgeon: Ltanya Rummer, MD;  Location: [facility] OR;  Service: Orthopedics;  Laterality: Left;   ATRIAL FIBRILLATION ABLATION N/A 12/25/2019   Procedure: ATRIAL FIBRILLATION ABLATION;  Surgeon: Lei Pump, MD;  Location: [facility] INVASIVE CV LAB;  Service: Cardiovascular;  Laterality: N/A;   ATRIAL FIBRILLATION ABLATION N/A 07/19/2021   Procedure: ATRIAL FIBRILLATION ABLATION;  Surgeon: Lei Pump, MD;  Location: [facility] INVASIVE CV LAB;  Service: Cardiovascular;  Laterality: N/A;   BIOPSY  08/11/2020   Procedure: BIOPSY;  Surgeon: Janel Medford, MD;  Location: [facility] ENDOSCOPY;  Service: Endoscopy;;   CARDIAC CATHETERIZATION     CARDIOVERSION N/A 04/17/2021   Procedure: CARDIOVERSION;  Surgeon: Sammy Crisp, MD;  Location: [facility] ORS;  Service: Cardiovascular;  Laterality: N/A;   ESOPHAGOGASTRODUODENOSCOPY (EGD) WITH PROPOFOL  N/A 08/11/2020   Procedure: ESOPHAGOGASTRODUODENOSCOPY (EGD) WITH PROPOFOL ;  Surgeon: Janel Medford, MD;  Location: [facility] ENDOSCOPY;  Service: Endoscopy;  Laterality: N/A;   I & D EXTREMITY Left 08/07/2022   Procedure: IRRIGATION AND DEBRIDEMENT;  Surgeon: Ltanya Rummer, MD;  Location[facility] OR;  Service: Orthopedics;  Laterality: Left;   KNEE SURGERY     PILONIDAL CYST / SINUS EXCISION     UPPER ESOPHAGEAL ENDOSCOPIC ULTRASOUND (EUS) N/A 08/11/2020   Procedure: UPPER ESOPHAGEAL ENDOSCOPIC ULTRASOUND (EUS);  Surgeon: Janel Medford, MD;  Location: [facility];  Service: Endoscopy;  Laterality: N/A;    Current Medications: Current Meds  Medication Sig   albuterol  (VENTOLIN  HFA)  108 (90 Base) MCG/ACT inhaler Inhale 2 puffs into the lungs every 6 (six) hours as needed for wheezing or shortness of breath.    allopurinol (ZYLOPRIM) 100 MG tablet Take 100 mg by mouth in the morning.   Ascorbic Acid (VITAMIN C) 1000 MG tablet Take 1,000 mg by mouth 2 (two) times daily.   atorvastatin  (LIPITOR) 20 MG tablet Take 20 mg by mouth daily.   budesonide-formoterol (SYMBICORT) 160-4.5 MCG/ACT inhaler Inhale 1 puff into the lungs daily.   Cholecalciferol (VITAMIN D3) 50 MCG (2000 UT) TABS Take 2,000 Units by mouth 2 (two) times daily.    ELIQUIS  5 MG TABS tablet Take 1 tablet (5 mg total) by mouth 2 (two) times daily.   empagliflozin (JARDIANCE) 25 MG TABS tablet Take 25 mg by mouth in the morning.   ENTRESTO  49-51 MG Take 1 tablet by mouth 2 (two) times daily.   Eszopiclone 3 MG TABS Take 3 mg by mouth at bedtime.   finasteride (PROSCAR) 5 MG tablet Take 5 mg by mouth  daily.   flecainide  (TAMBOCOR ) 100 MG tablet Take 1 tablet (100 mg total) by mouth 2 (two) times daily.   fluticasone (FLONASE) 50 MCG/ACT nasal spray Place 2 sprays into both nostrils daily as needed for allergies or rhinitis.   furosemide  (LASIX ) 40 MG tablet Take 40 mg by mouth daily as needed for edema or fluid.   insulin  lispro (HUMALOG) 100 UNIT/ML injection Inject 0-10 Units into the skin 3 (three) times daily as needed for high blood sugar. Sliding Scale   montelukast  (SINGULAIR ) 10 MG tablet Take 10 mg by mouth at bedtime.   Omega-3 Fatty Acids (FISH OIL) 1200 MG CAPS Take 1,200 mg by mouth 2 (two) times daily.    omeprazole (PRILOSEC) 40 MG capsule Take 40 mg by mouth 2 (two) times daily.    OZEMPIC, 0.25 OR 0.5 MG/DOSE, 2 MG/3ML SOPN Inject 1 mg into the skin once a week.   rOPINIRole  (REQUIP ) 4 MG tablet Take 4 mg by mouth daily.   spironolactone  (ALDACTONE ) 25 MG tablet Take 1 tablet (25 mg total) by mouth daily.   tamsulosin (FLOMAX) 0.4 MG CAPS capsule Take 0.4 capsules by mouth 2 (two) times daily.    TOUJEO MAX SOLOSTAR 300 UNIT/ML Solostar Pen Inject 140 Units into the skin at bedtime.   venlafaxine XR (EFFEXOR-XR) 150 MG 24 hr capsule Take 150 mg by mouth in the morning.   Vitamin D, Ergocalciferol, (DRISDOL) 1.25 MG (50000 UT) CAPS capsule Take 50,000 Units by mouth every Tuesday. IN THE MORNING   [DISCONTINUED] metoCLOPramide (REGLAN) 10 MG tablet Take 10 mg by mouth as needed for nausea (and diarrhea).     Allergies:   Sitagliptin, Prednisone, Victoza [liraglutide], and Sulfa antibiotics   Social History   Socioeconomic History   Marital status: Married    Spouse name: Not on file   Number of children: Not on file   Years of education: Not on file   Highest education level: Not on file  Occupational History   Not on file  Tobacco Use   Smoking status: Never   Smokeless tobacco: Never  Vaping Use   Vaping status: Never Used  Substance and Sexual Activity   Alcohol  use: Not Currently   Drug use: Never   Sexual activity: Not on file  Other Topics Concern   Not on file  Social History Narrative   Not on file   Social Drivers of Health   Financial Resource Strain: Low Risk  (07/11/2022)   Overall Financial Resource Strain (CARDIA)    Difficulty of Paying Living Expenses: Not hard at all  Food Insecurity: No Food Insecurity (07/11/2022)   Hunger Vital Sign    Worried About Running Out of Food in the Last Year: Never true    Ran Out of Food in the Last Year: Never true  Transportation Needs: No Transportation Needs (07/11/2022)   PRAPARE - Administrator, Civil Service (Medical): No    Lack of Transportation (Non-Medical): No  Physical Activity: Not on file  Stress: Not on file  Social Connections: Not on file     Family History: The patient's family history includes Colon cancer in his father; Hyperlipidemia in his father; Hypertension in his father; Liver cancer in his father; Lung cancer in his father. ROS:   Please see the history of present illness.     All 14 point review of systems negative except as described per history of present illness.  EKGs/Labs/Other Studies Reviewed:    The following  studies were reviewed today:   EKG:  EKG Interpretation Date/Time:  Wednesday April 01 2024 15:44:53 EDT Ventricular Rate:  88 PR Interval:  302 QRS Duration:  110 QT Interval:  360 QTC Calculation: 435 R Axis:   9  Text Interpretation: Sinus rhythm with 1st degree A-V block Cannot rule out Anterior infarct (cited on or before 13-Dec-2023) Abnormal ECG When compared with ECG of 13-Dec-2023 08:40, Questionable change in initial forces of Anterior leads Confirmed by Bertha Broad reddy [phone]) on 04/01/2024 4:18:36 PM    Recent Labs: 09/04/2023: Magnesium 2.0; NT-Pro BNP 37 02/15/2024: ALT 47; BUN 15; Creatinine, Ser 1.22; Hemoglobin 14.9; Platelets 202; Potassium 4.2; Sodium 137  Recent Lipid Panel    Component Value Date/Time   CHOL 135 10/09/2019 0820   TRIG 159 (H) 10/09/2019 0820   HDL 47 10/09/2019 0820   CHOLHDL 2.9 10/09/2019 0820   LDLCALC 61 10/09/2019 0820    Physical Exam:    VS:  BP 124/70   Pulse 88   Ht 6\' 2"  (1.88 m)   Wt (!) 308 lb 6.4 oz (139.9 kg)   SpO2 94%   BMI 39.60 kg/m     Wt Readings from Last 3 Encounters:  04/01/24 (!) 308 lb 6.4 oz (139.9 kg)  02/25/24 (!) 313 lb (142 kg)  02/15/24 (!) 305 lb (138.3 kg)     GENERAL:  Well nourished, well developed in no acute distress NECK: No JVD; No carotid bruits CARDIAC: RRR, S1 and S2 present, no murmurs, no rubs, no gallops CHEST:  Clear to auscultation without rales, wheezing or rhonchi  Extremities: No pitting pedal edema. Pulses bilaterally symmetric with radial 2+ and dorsalis pedis 2+ NEUROLOGIC:  Alert and oriented x 3  Medication Adjustments/Labs and Tests Ordered: Current medicines are reviewed at length with the patient today.  Concerns regarding medicines are outlined above.  Orders Placed This Encounter  Procedures   EKG 12-Lead   No  orders of the defined types were placed in this encounter.   Signed, Lura Sallies, MD, MPH, [facility]. 04/01/2024 4:39 PM    [facility]

## 2024-04-01 NOTE — Assessment & Plan Note (Addendum)
 Remains in sinus rhythm. History of ablation December 2021 and redo ablation in September 2022.  Remains on rhythm control with flecainide  100 mg twice daily. Metoprolol  succinate was recently discontinued due to slow heart rates by Dr. Lawana Pray in March 2025.  He has noticed significantly elevated heart rates at home up to 110/min in sinus rhythm, confirmed using his smart watch.  He did not have significant bradycardia episodes on heart monitor in February with slowest heart rate sinus rhythm around 40s.  Will reintroduce metoprolol  succinate 12.5 mg once daily so as to keep it in conjunction with his ongoing therapy with flecainide .  Continue anticoagulation with Eliquis  5 mg twice daily in the setting of CHA2DS2-VASc score of 4.

## 2024-04-01 NOTE — Assessment & Plan Note (Signed)
 Recovered LVEF from 35 to 40% in November 2019. Last echocardiogram January 2025 with EF 60 to 65%, normal RV function. Euvolemic and compensated. Trace bilateral ankle edema. Continue with salt restriction to below 2 g/day. Continue with furosemide  40 mg 2 times a week.  Continue guideline directed medical therapy. Resume metoprolol  succinate at a small dose 12.5 mg once daily. Continue Entresto  49 mg / 51 mg twice daily Continue spironolactone  25 mg once daily Continue Jardiance, on diabetic dose 25 mg once daily as prescribed by his PCP.

## 2024-04-01 NOTE — Assessment & Plan Note (Signed)
 Asymptomatic. Good functional status. Not on aspirin as he remains on Eliquis  for A-fib. Continue with atorvastatin  20 mg once daily.  Last lipid panel from November 14, 2023 LDL 49, HDL 42, total cholesterol 086 triglycerides 125.

## 2024-04-01 NOTE — Patient Instructions (Addendum)
 Medication Instructions:  Your physician has recommended you make the following change in your medication:   Stop Reglan  Start taking furosemide  40 mg twice weekly  Start Metoprolol  12.5 mg daily  *If you need a refill on your cardiac medications before your next appointment, please call your pharmacy*   Lab Work: None ordered If you have labs (blood work) drawn today and your tests are completely normal, you will receive your results only by: MyChart Message (if you have MyChart) OR A paper copy in the mail If you have any lab test that is abnormal or we need to change your treatment, we will call you to review the results.   Testing/Procedures: None ordered   Follow-Up: At [facility], you and your health needs are our priority.  As part of our continuing mission to provide you with exceptional heart care, we have created designated Provider Care Teams.  These Care Teams include your primary Cardiologist (physician) and Advanced Practice Providers (APPs -  Physician Assistants and Nurse Practitioners) who all work together to provide you with the care you need, when you need it.  We recommend signing up for the patient portal called "MyChart".  Sign up information is provided on this After Visit Summary.  MyChart is used to connect with patients for Virtual Visits (Telemedicine).  Patients are able to view lab/test results, encounter notes, upcoming appointments, etc.  Non-urgent messages can be sent to your provider as well.   To learn more about what you can do with MyChart, go to [email or web address].    Your next appointment:   6 month(s)  The format for your next appointment:   In Person  Provider:   Bertha Broad, MD    Other Instructions none  Important Information About Sugar

## 2024-04-02 DIAGNOSIS — S40262A Insect bite (nonvenomous) of left shoulder, initial encounter: Secondary | ICD-10-CM | POA: Diagnosis not present

## 2024-04-02 DIAGNOSIS — Z6839 Body mass index (BMI) 39.0-39.9, adult: Secondary | ICD-10-CM | POA: Diagnosis not present

## 2024-04-02 DIAGNOSIS — W57XXXA Bitten or stung by nonvenomous insect and other nonvenomous arthropods, initial encounter: Secondary | ICD-10-CM | POA: Diagnosis not present

## 2024-05-03 ENCOUNTER — Other Ambulatory Visit: Payer: Self-pay | Admitting: Cardiology

## 2024-05-11 DIAGNOSIS — G4700 Insomnia, unspecified: Secondary | ICD-10-CM | POA: Diagnosis not present

## 2024-05-11 DIAGNOSIS — R600 Localized edema: Secondary | ICD-10-CM | POA: Diagnosis not present

## 2024-05-11 DIAGNOSIS — D649 Anemia, unspecified: Secondary | ICD-10-CM | POA: Diagnosis not present

## 2024-05-11 DIAGNOSIS — K219 Gastro-esophageal reflux disease without esophagitis: Secondary | ICD-10-CM | POA: Diagnosis not present

## 2024-05-11 DIAGNOSIS — E559 Vitamin D deficiency, unspecified: Secondary | ICD-10-CM | POA: Diagnosis not present

## 2024-05-11 DIAGNOSIS — G2581 Restless legs syndrome: Secondary | ICD-10-CM | POA: Diagnosis not present

## 2024-05-11 DIAGNOSIS — I4891 Unspecified atrial fibrillation: Secondary | ICD-10-CM | POA: Diagnosis not present

## 2024-05-21 DIAGNOSIS — J019 Acute sinusitis, unspecified: Secondary | ICD-10-CM | POA: Diagnosis not present

## 2024-06-05 ENCOUNTER — Telehealth: Payer: Self-pay | Admitting: *Deleted

## 2024-06-05 DIAGNOSIS — I480 Paroxysmal atrial fibrillation: Secondary | ICD-10-CM

## 2024-06-05 MED ORDER — APIXABAN 5 MG PO TABS: 5.0000 mg | ORAL_TABLET | Freq: Two times a day (BID) | ORAL | 1 refills | Status: DC

## 2024-06-05 NOTE — Telephone Encounter (Signed)
 Refill of Eliquis  sent in.

## 2024-06-10 DIAGNOSIS — Z6838 Body mass index (BMI) 38.0-38.9, adult: Secondary | ICD-10-CM | POA: Diagnosis not present

## 2024-06-10 DIAGNOSIS — J019 Acute sinusitis, unspecified: Secondary | ICD-10-CM | POA: Diagnosis not present

## 2024-06-10 DIAGNOSIS — G4700 Insomnia, unspecified: Secondary | ICD-10-CM | POA: Diagnosis not present

## 2024-07-02 DIAGNOSIS — Z6838 Body mass index (BMI) 38.0-38.9, adult: Secondary | ICD-10-CM | POA: Diagnosis not present

## 2024-07-02 DIAGNOSIS — E1169 Type 2 diabetes mellitus with other specified complication: Secondary | ICD-10-CM | POA: Diagnosis not present

## 2024-07-02 DIAGNOSIS — J45909 Unspecified asthma, uncomplicated: Secondary | ICD-10-CM | POA: Diagnosis not present

## 2024-07-02 DIAGNOSIS — E559 Vitamin D deficiency, unspecified: Secondary | ICD-10-CM | POA: Diagnosis not present

## 2024-07-02 DIAGNOSIS — Z79899 Other long term (current) drug therapy: Secondary | ICD-10-CM | POA: Diagnosis not present

## 2024-07-13 DIAGNOSIS — M1990 Unspecified osteoarthritis, unspecified site: Secondary | ICD-10-CM | POA: Diagnosis not present

## 2024-07-13 DIAGNOSIS — G4700 Insomnia, unspecified: Secondary | ICD-10-CM | POA: Diagnosis not present

## 2024-07-13 DIAGNOSIS — N400 Enlarged prostate without lower urinary tract symptoms: Secondary | ICD-10-CM | POA: Diagnosis not present

## 2024-07-13 DIAGNOSIS — E785 Hyperlipidemia, unspecified: Secondary | ICD-10-CM | POA: Diagnosis not present

## 2024-07-13 DIAGNOSIS — N401 Enlarged prostate with lower urinary tract symptoms: Secondary | ICD-10-CM | POA: Diagnosis not present

## 2024-07-13 DIAGNOSIS — E1121 Type 2 diabetes mellitus with diabetic nephropathy: Secondary | ICD-10-CM | POA: Diagnosis not present

## 2024-07-13 DIAGNOSIS — E669 Obesity, unspecified: Secondary | ICD-10-CM | POA: Diagnosis not present

## 2024-07-17 ENCOUNTER — Telehealth: Payer: Self-pay

## 2024-07-17 MED ORDER — ENTRESTO 49-51 MG PO TABS: 1.0000 | ORAL_TABLET | Freq: Two times a day (BID) | ORAL | 9 refills | Status: DC

## 2024-07-17 NOTE — Telephone Encounter (Signed)
 Pt's medication was sent to pt's pharmacy as requested. Confirmation received.

## 2024-07-17 NOTE — Telephone Encounter (Signed)
*  STAT* If patient is at the pharmacy, call can be transferred to refill team.   1. Which medications need to be refilled? (please list name of each medication and dose if known) ENTRESTO  49-51 MG    2. Would you like to learn more about the convenience, safety, & potential cost savings by using the [facility]?     3. Are you open to using the [facility] (Type [facility].  ).   4. Which pharmacy/location (including street and city if local pharmacy) is medication to be sent to? [facility] - [place], [place] - [address]    5. Do they need a 30 day or 90 day supply? 90 day

## 2024-08-05 DIAGNOSIS — Z6838 Body mass index (BMI) 38.0-38.9, adult: Secondary | ICD-10-CM | POA: Diagnosis not present

## 2024-08-05 DIAGNOSIS — G4700 Insomnia, unspecified: Secondary | ICD-10-CM | POA: Diagnosis not present

## 2024-08-05 DIAGNOSIS — K219 Gastro-esophageal reflux disease without esophagitis: Secondary | ICD-10-CM | POA: Diagnosis not present

## 2024-08-05 DIAGNOSIS — D649 Anemia, unspecified: Secondary | ICD-10-CM | POA: Diagnosis not present

## 2024-08-05 DIAGNOSIS — R600 Localized edema: Secondary | ICD-10-CM | POA: Diagnosis not present

## 2024-08-05 DIAGNOSIS — H6123 Impacted cerumen, bilateral: Secondary | ICD-10-CM | POA: Diagnosis not present

## 2024-08-05 DIAGNOSIS — I4891 Unspecified atrial fibrillation: Secondary | ICD-10-CM | POA: Diagnosis not present

## 2024-08-05 DIAGNOSIS — E559 Vitamin D deficiency, unspecified: Secondary | ICD-10-CM | POA: Diagnosis not present

## 2024-08-05 DIAGNOSIS — G2581 Restless legs syndrome: Secondary | ICD-10-CM | POA: Diagnosis not present

## 2024-09-09 DIAGNOSIS — R32 Unspecified urinary incontinence: Secondary | ICD-10-CM | POA: Diagnosis not present

## 2024-09-09 DIAGNOSIS — I10 Essential (primary) hypertension: Secondary | ICD-10-CM | POA: Diagnosis not present

## 2024-09-09 DIAGNOSIS — G4700 Insomnia, unspecified: Secondary | ICD-10-CM | POA: Diagnosis not present

## 2024-09-09 DIAGNOSIS — J309 Allergic rhinitis, unspecified: Secondary | ICD-10-CM | POA: Diagnosis not present

## 2024-09-09 DIAGNOSIS — Z6838 Body mass index (BMI) 38.0-38.9, adult: Secondary | ICD-10-CM | POA: Diagnosis not present

## 2024-09-14 ENCOUNTER — Encounter: Payer: Self-pay | Admitting: Cardiology

## 2024-09-14 ENCOUNTER — Ambulatory Visit: Attending: Cardiology | Admitting: Cardiology

## 2024-09-14 VITALS — BP 102/62 | HR 90 | Ht 74.0 in | Wt 290.0 lb

## 2024-09-14 DIAGNOSIS — I4819 Other persistent atrial fibrillation: Secondary | ICD-10-CM | POA: Insufficient documentation

## 2024-09-14 DIAGNOSIS — Z79899 Other long term (current) drug therapy: Secondary | ICD-10-CM | POA: Diagnosis present

## 2024-09-14 DIAGNOSIS — D6869 Other thrombophilia: Secondary | ICD-10-CM | POA: Insufficient documentation

## 2024-09-14 DIAGNOSIS — I10 Essential (primary) hypertension: Secondary | ICD-10-CM | POA: Insufficient documentation

## 2024-09-14 NOTE — Progress Notes (Signed)
  Electrophysiology Office Note:   Date:  09/14/2024  ID:  Sean Margarite Raddle., DOB 1950/11/25, MRN [id number]  Primary Cardiologist: Alean SAUNDERS Madireddy, MD Primary Heart Failure: None Electrophysiologist: Jameica Couts Gladis Norton, MD      History of Present Illness:   Sean Shean. is a 73 y.o. male with h/o atrial fibrillation, chronic systolic heart failure, hypertension seen today for routine electrophysiology followup.   Discussed the use of AI scribe software for clinical note transcription with the patient, who gave verbal consent to proceed.  History of Present Illness Sean [name] is a 73 year old male who presents for follow-up of cardiac issues and medication refill.  He generally feels well with stable blood pressure, heart rate, and A1c levels. He has experienced some weight loss. Occasionally, he has breathing problems and headaches, which he attributes to COVID vaccinations.  He requests a refill of spironolactone .    .  he denies chest pain, palpitations, dyspnea, PND, orthopnea, nausea, vomiting, dizziness, syncope, edema, weight gain, or early satiety.   Review of systems complete and found to be negative unless listed in HPI.   EP Information / Studies Reviewed:    EKG is ordered today. Personal review as below.        Risk Assessment/Calculations:    CHA2DS2-VASc Score =     This indicates a  % annual risk of stroke. The patient's score is based upon:             Physical Exam:   VS:  BP 102/62 (BP Location: Left Arm, Patient Position: Sitting, Cuff Size: Large)   Pulse 90   Ht 6' 2 (1.88 m)   Wt 290 lb (131.5 kg)   SpO2 93%   BMI 37.23 kg/m    Wt Readings from Last 3 Encounters:  09/14/24 290 lb (131.5 kg)  04/01/24 (!) 308 lb 6.4 oz (139.9 kg)  02/25/24 (!) 313 lb (142 kg)     GEN: Well nourished, well developed in no acute distress NECK: No JVD; No carotid bruits CARDIAC: Regular rate and rhythm, no murmurs, rubs,  gallops RESPIRATORY:  Clear to auscultation without rales, wheezing or rhonchi  ABDOMEN: Soft, non-tender, non-distended EXTREMITIES:  No edema; No deformity   ASSESSMENT AND PLAN:    1.  Persistent atrial fibrillation: Post ablation x 2, most recently 07/19/2021.  On flecainide .  Remains in sinus rhythm.  No changes.  2.  Secondary hypercoagulable state: On Eliquis   3.  High risk medication monitoring: On flecainide .  QRS remains narrow.  As long first-degree AV block but no dropped beats.  4.  Chronic systolic heart failure: Due to nonischemic cardiomyopathy.  On Entresto  and metoprolol  with normalized ejection fraction.  5.  Hypertension: Well-controlled  Follow up with EP Team in 6 months  Signed, Wash Nienhaus Gladis Norton, MD

## 2024-10-02 ENCOUNTER — Ambulatory Visit: Payer: Self-pay

## 2024-10-09 ENCOUNTER — Ambulatory Visit

## 2024-10-09 VITALS — BP 150/82 | HR 57 | Ht 74.0 in | Wt 293.6 lb

## 2024-10-09 DIAGNOSIS — E782 Mixed hyperlipidemia: Secondary | ICD-10-CM | POA: Diagnosis present

## 2024-10-09 DIAGNOSIS — I2510 Atherosclerotic heart disease of native coronary artery without angina pectoris: Secondary | ICD-10-CM

## 2024-10-09 DIAGNOSIS — I4819 Other persistent atrial fibrillation: Secondary | ICD-10-CM

## 2024-10-09 DIAGNOSIS — I5032 Chronic diastolic (congestive) heart failure: Secondary | ICD-10-CM

## 2024-10-09 NOTE — Assessment & Plan Note (Signed)
 Asymptomatic. Remains with good functional status. Remains on Eliquis  for A-fib as above. Continue atorvastatin  20 mg once daily.

## 2024-10-09 NOTE — Patient Instructions (Signed)

## 2024-10-09 NOTE — Assessment & Plan Note (Signed)
 Recovered LVEF, was 35 to 40% in November 2019. Last echocardiogram January 2025 with EF 60 to 65%, normal RV function.  Remains euvolemic and compensated. Continue with salt restriction less than 2 g/day. Continue weight monitoring. On loop diuretic furosemide  40 mg which he takes every other day. Aware to take additional dose of furosemide  40 mg if weight goes up by 3 pounds in a day or 4 to 5 pounds in a week.  Continue guideline directed medical therapy On low-dose metoprolol  succinate 12.5 mg once daily, hold off on titrating up given relative low heart rates at baseline. Continue Entresto  49-51 mg twice daily Continue spironolactone  25 mg daily. Continue Jardiance, on diabetic dose 25 mg once daily.

## 2024-10-09 NOTE — Progress Notes (Signed)
 Cardiology Consultation:    Date:  10/09/2024   ID:  Morna Margarite Raddle., DOB [birth year], MRN [id number]  PCP:  Gable Cambric, MD  Cardiologist:  Alean SAUNDERS Jordi Kamm, MD   Referring MD: Gable Cambric, MD   No chief complaint on file.    ASSESSMENT AND PLAN:   Mr Michel 73 year old male has history of nonischemic cardiomyopathy with recovered LVEF, chronic diastolic heart failure, persistent atrial fibrillation s/p ablations [December 2021 and repeat ablation September 2022] and on rhythm control with flecainide , first-degree AV block, coronary atherosclerosis with calcium  score 65 on preablation cardiac CT September 2022 [prior cardiac CT May 2021 without any obstructive disease], hypertension, hyperlipidemia, obesity, diabetes mellitus, obstructive sleep apnea, BPH, osteoarthritis of both knees and chronic back pain from lumbar spinal stenosis. Last echocardiogram to review from 11-29-2023 noted normal LVEF 60 to 65% no significant valve abnormalities.   Reports overall doing well.  Problem List Items Addressed This Visit       Cardiovascular and Mediastinum   CHF (congestive heart failure) (HCC)   Recovered LVEF, was 35 to 40% in November 2019. Last echocardiogram January 2025 with EF 60 to 65%, normal RV function.  Remains euvolemic and compensated. Continue with salt restriction less than 2 g/day. Continue weight monitoring. On loop diuretic furosemide  40 mg which he takes every other day. Aware to take additional dose of furosemide  40 mg if weight goes up by 3 pounds in a day or 4 to 5 pounds in a week.  Continue guideline directed medical therapy On low-dose metoprolol  succinate 12.5 mg once daily, hold off on titrating up given relative low heart rates at baseline. Continue Entresto  49-51 mg twice daily Continue spironolactone  25 mg daily. Continue Jardiance, on diabetic dose 25 mg once daily.      Persistent atrial fibrillation [facility]) - Primary   History of ablation  December 2021 and redo ablation September 2022. Follows up with Dr. Inocencio and EP for rhythm control. Remains on rhythm control with flecainide  100 mg twice daily plus low-dose metoprolol  succinate 12.5 mg once daily.  EKG shows sinus rhythm with prolonged PR interval and narrow QRS today in the office. Tolerating well.  CHA2DS2-VASc score 5. On anticoagulation with Eliquis  5 mg twice daily, tolerating well. Continue same.      Relevant Orders   EKG 12-Lead (Completed)   Coronary atherosclerosis calcium  score 65 on CT coronary angiogram September 2022; prior cardiac CT 03/17/2020 no obstructive disease   Asymptomatic. Remains with good functional status. Remains on Eliquis  for A-fib as above. Continue atorvastatin  20 mg once daily.        Other   Hyperlipidemia   Lipid panel from 07/02/2024 reviewed on [facility] dated total cholesterol 121, triglycerides 116, HDL 50, LDL 50.   Well-controlled. Continue atorvastatin  20 mg once daily.       Return to clinic tentatively in 6 months for follow-up.   History of Present Illness:    Sean [name]. is a 73 y.o. male who is being seen today for follow-up visit. PCP is Gable Cambric, MD. Follows up with electrophysiologist Dr. Inocencio, last visit 09/14/2024. Last visit with me in the office was 04/01/2024.  Missed his last appointment with me  Pleasant man here for the visit by himself.  Lives at home with his wife is currently dealing with CKD stage IV and has been stressed at home.  He has history of nonischemic cardiomyopathy with recovered LVEF, chronic diastolic heart failure, persistent atrial fibrillation s/p ablations [employer]  2021 and repeat ablation September 2022] and on rhythm control with flecainide , coronary atherosclerosis with calcium  score 65 on preablation cardiac CT September 2022 [prior cardiac CT May 2021 without any obstructive disease], hypertension, hyperlipidemia, obesity, diabetes mellitus, obstructive sleep  apnea, BPH, osteoarthritis of both knees and chronic back pain from lumbar spinal stenosis. Last echocardiogram to review from 11-29-2023 noted normal LVEF 60 to 65% no significant valve abnormalities.   Mentions overall has been doing well. No active cardiac symptoms. Mentions blood pressures at home typically well-controlled. Here in the office elevated readings today attributes to being stressed at home and coming for the appointment. Has been losing weight intentionally weight down to 293 pounds was 308 pounds at his last visit with me.   EKG in the clinic today shows sinus rhythm heart rate 57/min, PR interval 304 ms, QRS duration 100 ms, QTc 418 ms.  No ischemic changes  Labs reviewed from [facility] dated 07/02/2024 notes lipid panel total cholesterol 121, triglycerides 116, HDL 50, LDL 50. Hemoglobin A1c 5.7 CMP with BUN 17, creatinine 1.3, eGFR 57 Sodium 136, potassium 4.5. ALT mildly elevated 54. AST and alkaline phosphatase normal. TSH normal 2.09. CBC with hemoglobin 14.3, hematocrit 43.6, WBC 6.1 and platelets 186. Past Medical History:  Diagnosis Date   Abdominal bruit 10/23/2022   Allergic rhinitis    Arrhythmia    Atrial fibrillation (HCC)    Atrial flutter (HCC)    Barrett's esophagus 08/03/2020   Benign prostatic hyperplasia with urinary obstruction 11/29/2018   BMI 39.0-39.9,adult 08/28/2023   Cardiomyopathy (HCC) 02/23/2020   CHF (congestive heart failure) (HCC)    Closed fracture of fifth metacarpal bone of left hand 08/11/2022   Coronary atherosclerosis calcium  score 65 on CT coronary angiogram September 2022; prior cardiac CT 03/17/2020 no obstructive disease 08/28/2023   Diabetes mellitus due to underlying condition with unspecified complications (HCC) 09/29/2019   Diabetes mellitus without complication (HCC)    DOE (dyspnea on exertion) 10/29/2019   S/p PE  11/2018 > DOAC recurrent 09/14/2019 off DOAC so resumed  - onset variable doe and orthostatic  lightheadedness 06/2019 with afib - PFT's [place]  10/08/2019 :  FEV1 2.79 (73%) with ratio 75 and 6 % better p saba,  Air trapping on lung vol,  ERV 14% and dlco 22.78 (60%) corrects to 3.68 (76%) for vol with minimal concavity to f/v loop  - 10/29/2019   Walked RA x two laps =  approx 588ft @ avg   Essential hypertension 09/29/2019   Fissure in skin of foot 12/11/2016   GERD (gastroesophageal reflux disease) 11/29/2018   Hemospermia 02/22/2020   History of colonic polyps 08/03/2020   History of pulmonary embolism 09/29/2019   Hyperlipidemia    Hypertension    Hypogonadism in male 02/22/2020   IBS (irritable bowel syndrome) 11/29/2018   Impotence 02/22/2020   Incomplete emptying of bladder 02/22/2020   Melena 08/03/2020   Mixed dyslipidemia 09/29/2019   Morbid obesity (HCC) 09/29/2019   Nonalcoholic fatty liver disease 08/03/2020   Open fracture of shaft of metacarpal bone 08/11/2022   OSA (obstructive sleep apnea)    Overgrown toenails 12/11/2016   PAF (paroxysmal atrial fibrillation) (HCC) 02/23/2020   Pain of left hand 08/09/2022   Persistent atrial fibrillation (HCC) 09/29/2019   Plantar fasciitis, bilateral 06/05/2016   Preoperative cardiovascular examination 11/28/2023   S/P ablation of atrial fibrillation 02/23/2020   Secondary hypercoagulable state 01/22/2020   Submucosal lesion of stomach 08/03/2020   Upper airway cough syndrome 12/15/2019  Noted on exam 12/15/2019 onset while on Entresto , resolved with purse lip    Past Surgical History:  Procedure Laterality Date   AMPUTATION Left 08/07/2022   Procedure: AMPUTATION FIFTH DIGIT;  Surgeon: Alyse Agent, MD;  Location: [facility] OR;  Service: Orthopedics;  Laterality: Left;   ATRIAL FIBRILLATION ABLATION N/A 12/25/2019   Procedure: ATRIAL FIBRILLATION ABLATION;  Surgeon: Inocencio Soyla Lunger, MD;  Location: [facility] INVASIVE CV LAB;  Service: Cardiovascular;  Laterality: N/A;   ATRIAL FIBRILLATION ABLATION N/A 07/19/2021    Procedure: ATRIAL FIBRILLATION ABLATION;  Surgeon: Inocencio Soyla Lunger, MD;  Location: [facility] INVAS[facility] CV LAB;  Service: Cardiovascular;  Laterality: N/A;   BIOPSY  08/11/2020   Procedure: BIOPSY;  Surgeon: Teressa Toribio SQUIBB, MD;  Location: [facility] ENDOSCOPY;  Service: Endoscopy;;   CARDIAC CATHETERIZATION     CARDIOVERSION N/A 04/17/2021   Procedure: CARDIOVERSION;  Surgeon: Mady Bruckner, MD;  Location: [facility] ORS;  Service: Cardiovascular;  Laterality: N/A;   ESOPHAGOGASTRODUODENOSCOPY (EGD) WITH PROPOFOL  N/A 08/11/2020   Procedure: ESOPHAGOGASTRODUODENOSCOPY (EGD) WITH PROPOFOL ;  Surgeon: Teressa Toribio SQUIBB, MD;  Location: [facility];  Service: Endoscopy;  Laterality: N/A;   I & D EXTREMITY Left 08/07/2022   Procedure: IRRIGATION AND DEBRIDEMENT;  Surgeon: Alyse Agent, MD;  Location: [facility] OR;  Service: Orthopedics;  Laterality: Left;   KNEE SURGERY     PILONIDAL CYST / SINUS EXCISION     UPPER ESOPHAGEAL ENDOSCOPIC ULTRASOUND (EUS) N/A 08/11/2020   Procedure: UPPER ESOPHAGEAL ENDOSCOPIC ULTRASOUND (EUS);  Surgeon: Teressa Toribio SQUIBB, MD;  Location: [facility];  Service: Endoscopy;  Laterality: N/A;    Current Medications: Active Medications[1]   Allergies:   Sitagliptin, Prednisone, Victoza [liraglutide], and Sulfa antibiotics   Social History   Socioeconomic History   Marital status: Married    Spouse name: Not on file   Number of children: Not on file   Years of education: Not on file   Highest education level: Not on file  Occupational History   Not on file  Tobacco Use   Smoking status: Never   Smokeless tobacco: Never  Vaping Use   Vaping status: Never Used  Substance and Sexual Activity   Alcohol  use: Not Currently   Drug use: Never   Sexual activity: Not on file  Other Topics Concern   Not on file  Social History Narrative   Not on file   Social Drivers of Health   Tobacco Use: Low Risk (09/14/2024)   Patient History    Smoking Tobacco Use: Never    Smokeless  Tobacco Use: Never    Passive Exposure: Not on file  Financial Resource Strain: Low Risk (07/11/2022)   Overall Financial Resource Strain (CARDIA)    Difficulty of Paying Living Expenses: Not hard at all  Food Insecurity: No Food Insecurity (07/11/2022)   Hunger Vital Sign    Worried About Running Out of Food in the Last Year: Never true    Ran Out of Food in the Last Year: Never true  Transportation Needs: No Transportation Needs (07/11/2022)   PRAPARE - Administrator, Civil Service (Medical): No    Lack of Transportation (Non-Medical): No  Physical Activity: Not on file  Stress: Not on file  Social Connections: Not on file  Depression (EYV7-0): Not on file  Alcohol  Screen: Not on file  Housing: Low Risk (07/11/2022)   Housing    Last Housing Risk Score: 0  Utilities: Not At Risk (07/11/2022)   AHC Utilities    Threatened with  loss of utilities: No  Health Literacy: Not on file     Family History: The patient's family history includes Colon cancer in his father; Hyperlipidemia in his father; Hypertension in his father; Liver cancer in his father; Lung cancer in his father. ROS:   Please see the history of present illness.    All 14 point review of systems negative except as described per history of present illness.  EKGs/Labs/Other Studies Reviewed:    The following studies were reviewed today:   EKG:  EKG Interpretation Date/Time:  Friday October 09 2024 16:05:46 EST Ventricular Rate:  57 PR Interval:  304 QRS Duration:  100 QT Interval:  430 QTC Calculation: 418 R Axis:   8  Text Interpretation: Sinus bradycardia with 1st degree A-V block Low voltage QRS Septal infarct (cited on or before 13-Dec-2023) When compared with ECG of 14-Sep-2024 11:55, Vent. rate has decreased BY  33 BPM Confirmed by Liborio Hai reddy [id number]) on 10/09/2024 4:25:50 PM    Recent Labs: 02/15/2024: ALT 47; BUN 15; Creatinine, Ser 1.22; Hemoglobin 14.9; Platelets 202; Potassium  4.2; Sodium 137  Recent Lipid Panel    Component Value Date/Time   CHOL 135 10/09/2019 0820   TRIG 159 (H) 10/09/2019 0820   HDL 47 10/09/2019 0820   CHOLHDL 2.9 10/09/2019 0820   LDLCALC 61 10/09/2019 0820    Physical Exam:    VS:  BP (!) 150/82   Pulse (!) 57   Ht 6' 2 (1.88 m)   Wt 293 lb 9.6 oz (133.2 kg)   SpO2 97%   BMI 37.70 kg/m     Wt Readings from Last 3 Encounters:  10/09/24 293 lb 9.6 oz (133.2 kg)  09/14/24 290 lb (131.5 kg)  04/01/24 (!) 308 lb 6.4 oz (139.9 kg)     GENERAL:  Well nourished, well developed in no acute distress NECK: No JVD; No carotid bruits CARDIAC: RRR, S1 and S2 present, no murmurs, no rubs, no gallops CHEST:  Clear to auscultation without rales, wheezing or rhonchi  Extremities: Trace ankle pitting edema. Pulses bilaterally symmetric with radial 2+ and dorsalis pedis 2+ NEUROLOGIC:  Alert and oriented x 3  Medication Adjustments/Labs and Tests Ordered: Current medicines are reviewed at length with the patient today.  Concerns regarding medicines are outlined above.  Orders Placed This Encounter  Procedures   EKG 12-Lead   No orders of the defined types were placed in this encounter.   Signed, Devani Odonnel reddy Ellerie Arenz, MD, MPH, [facility]. 10/09/2024 7:46 PM    [facility]     [1]  Current Meds  Medication Sig   levalbuterol (XOPENEX) 1.25 MG/3ML nebulizer solution Take 1.25 mg by nebulization every 6 (six) hours as needed.   OZEMPIC, 2 MG/DOSE, 8 MG/3ML SOPN Inject 2 mg into the skin once a week.

## 2024-10-09 NOTE — Assessment & Plan Note (Signed)
 Lipid panel from 07/02/2024 reviewed on [facility] dated total cholesterol 121, triglycerides 116, HDL 50, LDL 50.   Well-controlled. Continue atorvastatin  20 mg once daily.

## 2024-10-09 NOTE — Assessment & Plan Note (Signed)
 History of ablation December 2021 and redo ablation September 2022. Follows up with Dr. Inocencio and EP for rhythm control. Remains on rhythm control with flecainide  100 mg twice daily plus low-dose metoprolol  succinate 12.5 mg once daily.  EKG shows sinus rhythm with prolonged PR interval and narrow QRS today in the office. Tolerating well.  CHA2DS2-VASc score 5. On anticoagulation with Eliquis  5 mg twice daily, tolerating well. Continue same.

## 2024-10-14 DIAGNOSIS — N401 Enlarged prostate with lower urinary tract symptoms: Secondary | ICD-10-CM | POA: Diagnosis not present

## 2024-10-14 DIAGNOSIS — E1121 Type 2 diabetes mellitus with diabetic nephropathy: Secondary | ICD-10-CM | POA: Diagnosis not present

## 2024-10-14 DIAGNOSIS — E669 Obesity, unspecified: Secondary | ICD-10-CM | POA: Diagnosis not present

## 2024-10-14 DIAGNOSIS — R7989 Other specified abnormal findings of blood chemistry: Secondary | ICD-10-CM | POA: Diagnosis not present

## 2024-10-14 DIAGNOSIS — G4700 Insomnia, unspecified: Secondary | ICD-10-CM | POA: Diagnosis not present

## 2024-10-14 DIAGNOSIS — M1990 Unspecified osteoarthritis, unspecified site: Secondary | ICD-10-CM | POA: Diagnosis not present

## 2024-10-14 DIAGNOSIS — E785 Hyperlipidemia, unspecified: Secondary | ICD-10-CM | POA: Diagnosis not present

## 2024-10-14 DIAGNOSIS — Z6838 Body mass index (BMI) 38.0-38.9, adult: Secondary | ICD-10-CM | POA: Diagnosis not present

## 2024-10-15 ENCOUNTER — Other Ambulatory Visit: Payer: Self-pay

## 2024-10-15 ENCOUNTER — Other Ambulatory Visit (HOSPITAL_COMMUNITY): Payer: Self-pay

## 2024-10-15 ENCOUNTER — Telehealth: Payer: Self-pay | Admitting: Pharmacy Technician

## 2024-10-15 ENCOUNTER — Telehealth: Payer: Self-pay

## 2024-10-15 DIAGNOSIS — I10 Essential (primary) hypertension: Secondary | ICD-10-CM

## 2024-10-15 MED ORDER — ENTRESTO 49-51 MG PO TABS: 1.0000 | ORAL_TABLET | Freq: Two times a day (BID) | ORAL | 9 refills | Status: DC

## 2024-10-15 MED ORDER — SPIRONOLACTONE 25 MG PO TABS: 25.0000 mg | ORAL_TABLET | Freq: Every day | ORAL | 3 refills | Status: AC

## 2024-10-15 NOTE — Telephone Encounter (Signed)
 [facility] requesting Prior Auth. For Entresto  49-51mg . Will forward to PA team for completion.

## 2024-10-15 NOTE — Telephone Encounter (Signed)
 Drug does not need a prior authorization -just override on dur  Had to do dur override and went through for 590.65 for brand  But generic is free on this insurance   Sent message to see if he can have generic

## 2024-10-16 ENCOUNTER — Other Ambulatory Visit: Payer: Self-pay

## 2024-10-16 MED ORDER — SACUBITRIL-VALSARTAN 49-51 MG PO TABS: 1.0000 | ORAL_TABLET | Freq: Two times a day (BID) | ORAL | 3 refills | Status: AC

## 2024-10-16 NOTE — Telephone Encounter (Signed)
 Pt calling back in to get generic brand. Please can we get this sent to pharmacy ? Please advise.

## 2024-10-28 ENCOUNTER — Other Ambulatory Visit: Payer: Self-pay

## 2024-10-28 MED ORDER — FLECAINIDE ACETATE 100 MG PO TABS: 100.0000 mg | ORAL_TABLET | Freq: Two times a day (BID) | ORAL | 3 refills | Status: AC

## 2024-11-03 ENCOUNTER — Telehealth: Payer: Self-pay

## 2024-11-03 NOTE — Telephone Encounter (Signed)
 Pt has been having intermittent episodes of afib as well as some low heart rate readings. Pt not having active symptoms at time of call. He would like to discuss with Dr. Inocencio. Please advise.   Patient c/o Palpitations:  STAT if patient reporting lightheadedness, shortness of breath, or chest pain  How long have you had palpitations/irregular HR/ Afib? Are you having the symptoms now? Yes   Are you currently experiencing lightheadedness, SOB or CP? No   Do you have a history of afib (atrial fibrillation) or irregular heart rhythm? Yes   Have you checked your BP or HR? (document readings if available): on phone   Are you experiencing any other symptoms? Just doesn't feel good

## 2024-11-03 NOTE — Telephone Encounter (Signed)
 Pt has been having palpitations for last several weeks, prior to Dr. Liborio. A week or two ago, watch would show intermittent afib, HR as low as 40's per patient. Pt has no symptoms at time of call. Denies any shortness of breath. Pt states he doesn't feel good, but typically doesn't have any symptoms. Pt would like to see Dr Inocencio or Dr. Liborio. Will send to scheduling team for [facility], as this is where patient normally goes for appointments and requests. Pt verbalizes understanding of plan.

## 2024-11-05 ENCOUNTER — Ambulatory Visit

## 2024-11-05 VITALS — BP 134/72 | HR 63 | Ht 74.0 in | Wt 292.0 lb

## 2024-11-05 DIAGNOSIS — I5032 Chronic diastolic (congestive) heart failure: Secondary | ICD-10-CM | POA: Insufficient documentation

## 2024-11-05 DIAGNOSIS — I4819 Other persistent atrial fibrillation: Secondary | ICD-10-CM | POA: Insufficient documentation

## 2024-11-05 DIAGNOSIS — I2510 Atherosclerotic heart disease of native coronary artery without angina pectoris: Secondary | ICD-10-CM | POA: Insufficient documentation

## 2024-11-05 DIAGNOSIS — E782 Mixed hyperlipidemia: Secondary | ICD-10-CM | POA: Diagnosis present

## 2024-11-05 NOTE — Progress Notes (Signed)
 "  Cardiology Consultation:    Date:  11/05/2024   ID:  Sean [name]., DOB Feb 10, 1951, MRN [id number]  PCP:  Gable Cambric, MD  Cardiologist:  Alean SAUNDERS Alexsandria Kivett, MD   Referring MD: Gable Cambric, MD   No chief complaint on file.    ASSESSMENT AND PLAN:   Sean [name] 74 year old male  history of nonischemic cardiomyopathy  with recovered LVEF, chronic diastolic heart failure, persistent atrial fibrillation s/p ablations [December 2021 and repeat ablation September 2022] and on rhythm control with flecainide , first-degree AV block, coronary atherosclerosis with calcium  score 65 on preablation cardiac CT September 2022 [prior cardiac CT May 2021 without any obstructive disease], hypertension, hyperlipidemia, obesity, diabetes mellitus, obstructive sleep apnea, BPH, osteoarthritis of both knees and chronic back pain from lumbar spinal stenosis. Last echocardiogram to review from 11-29-2023 noted normal LVEF 60 to 65% no significant valve abnormalities.    Here for earlier follow-up now in the setting of symptoms of atrial fibrillation causing tiredness and documented on his Apple smart watch at home, 3 episodes in the past month.   Problem List Items Addressed This Visit       Cardiovascular and Mediastinum   CHF (congestive heart failure) (HCC)   Recovered LVEF, was 35 to 40% in November 2019. Last echocardiogram January 2025 with EF 60 to 65%, normal RV function.   Remains euvolemic and compensated. Continue with salt restriction less than 2 g/day.  On guideline directed medical therapy on low-dose metoprolol  XL 12.5 mg once daily Entresto  49-51 mg twice daily Spironolactone  25 mg once daily Jardiance on diabetic dose.       Relevant Medications   atorvastatin  (LIPITOR) 10 MG tablet   Persistent atrial fibrillation [facility]) - Primary   History of ablation December 2021 and redo ablation September 2022. Follows up with Dr. Inocencio and EP for rhythm control. Remains on rhythm control  with flecainide  100 mg twice daily plus low-dose metoprolol  succinate 12.5 mg once daily.  EKG continues to show sinus rhythm with markedly prolonged PR interval. Narrow QRS.  Had 3 tracing showing A-fib on his Apple smart watch in the past month with well-controlled heart rates.  With breakthrough episodes, will obtain a Zio patch for 14 days to to assess overall burden of A-fib.  Continue flecainide  and metoprolol  at current doses. If he has significant breakthrough episodes of A-fib, we will have an follow-up with Dr. Inocencio with regards to alternative options for rhythm control such as Tikosyn or sotalol.  CHA2DS2-VASc score 5 Continue anticoagulation with Eliquis  5 mg twice daily.      Relevant Medications   atorvastatin  (LIPITOR) 10 MG tablet   Other Relevant Orders   Ambulatory referral to Cardiac Electrophysiology   LONG TERM MONITOR (3-14 DAYS)   Coronary atherosclerosis calcium  score 65 on CT coronary angiogram September 2022; prior cardiac CT 03/17/2020 no obstructive disease   Good functional status. Appears symptomatic for breakthrough paroxysmal A-fib episodes. Continue Eliquis  and atorvastatin  as reviewed above.       Relevant Medications   atorvastatin  (LIPITOR) 10 MG tablet     Other   Hyperlipidemia   Continue atorvastatin  20 mg once daily. Last lipid panel 07/02/2024.      Relevant Medications   atorvastatin  (LIPITOR) 10 MG tablet   Other Relevant Orders   EKG 12-Lead (Completed)   Return to clinic for follow-up after Zio patch results tentatively with Dr. Inocencio depending on A-fib burden.  Follow-up with me in the office based on test results  from the Zio patch.    History of Present Illness:    Sean [name]. is a 74 y.o. male who is being seen today for follow-up visit. PCP is Gable Cambric, MD. Follows up with Dr. Inocencio and electrophysiology. Last visit in the office with me was 10/09/2024.  Here for earlier follow-up due to ongoing  symptoms of palpitations for several weeks.  Here for the visit today by himself.  At home mentions things have been stressful with his wife's health deteriorating from polycystic kidney disease and being initiated on hemodialysis.  Has history of nonischemic cardiomyopathy  with recovered LVEF, chronic diastolic heart failure, persistent atrial fibrillation s/p ablations [December 2021 and repeat ablation September 2022] and on rhythm control with flecainide , first-degree AV block, coronary atherosclerosis with calcium  score 65 on preablation cardiac CT September 2022 [prior cardiac CT May 2021 without any obstructive disease], hypertension, hyperlipidemia, obesity, diabetes mellitus, obstructive sleep apnea, BPH, osteoarthritis of both knees and chronic back pain from lumbar spinal stenosis. Last echocardiogram to review from 11-29-2023 noted normal LVEF 60 to 65% no significant valve abnormalities.    Mentions over the last several weeks with stress at home with his wife's health he has noticed episodes of atrial fibrillation which was able to document on his Apple smart watch. Reviewed 3 tracings since December where he was in atrial fibrillation with relatively well-controlled rates in 50s to 60s. He mentions awareness of irregular heartbeat and feels tired during these episodes. Denies any syncopal episodes or falls.  EKG in the clinic today shows sinus rhythm with prolonged first-degree AV block.  Denies any other significant change in his overall symptoms. Good compliance with his medications.  Previous Zio patch from March 2025 noted average heart rate 72/min [ranging from 49 to 125 bpm in sinus rhythm, no evidence of atrial fibrillation.  Rare ventricular and supraventricular ectopy burden less than 1%.  Slowest heart rate on the heart monitor correlated with nonconducted supraventricular ectopic beats.  Past Medical History:  Diagnosis Date   Abdominal bruit 10/23/2022   Allergic  rhinitis    Arrhythmia    Atrial fibrillation (HCC)    Atrial flutter (HCC)    Barrett's esophagus 08/03/2020   Benign prostatic hyperplasia with urinary obstruction 11/29/2018   BMI 39.0-39.9,adult 08/28/2023   Cardiomyopathy (HCC) 02/23/2020   CHF (congestive heart failure) (HCC)    Closed fracture of fifth metacarpal bone of left hand 08/11/2022   Coronary atherosclerosis calcium  score 65 on CT coronary angiogram September 2022; prior cardiac CT 03/17/2020 no obstructive disease 08/28/2023   Diabetes mellitus due to underlying condition with unspecified complications (HCC) 09/29/2019   Diabetes mellitus without complication (HCC)    DOE (dyspnea on exertion) 10/29/2019   S/p PE  11/2018 > DOAC recurrent 09/14/2019 off DOAC so resumed  - onset variable doe and orthostatic lightheadedness 06/2019 with afib - PFT's [place]  10/08/2019 :  FEV1 2.79 (73%) with ratio 75 and 6 % better p saba,  Air trapping on lung vol,  ERV 14% and dlco 22.78 (60%) corrects to 3.68 (76%) for vol with minimal concavity to f/v loop  - 10/29/2019   Walked RA x two laps =  approx 521ft @ avg   Essential hypertension 09/29/2019   Fissure in skin of foot 12/11/2016   GERD (gastroesophageal reflux disease) 11/29/2018   Hemospermia 02/22/2020   History of colonic polyps 08/03/2020   History of pulmonary embolism 09/29/2019   Hyperlipidemia    Hypertension    Hypogonadism  in male 02/22/2020   IBS (irritable bowel syndrome) 11/29/2018   Impotence 02/22/2020   Incomplete emptying of bladder 02/22/2020   Melena 08/03/2020   Mixed dyslipidemia 09/29/2019   Morbid obesity (HCC) 09/29/2019   Nonalcoholic fatty liver disease 08/03/2020   Open fracture of shaft of metacarpal bone 08/11/2022   OSA (obstructive sleep apnea)    Overgrown toenails 12/11/2016   PAF (paroxysmal atrial fibrillation) (HCC) 02/23/2020   Pain of left hand 08/09/2022   Persistent atrial fibrillation (HCC) 09/29/2019   Plantar fasciitis,  bilateral 06/05/2016   Preoperative cardiovascular examination 11/28/2023   S/P ablation of atrial fibrillation 02/23/2020   Secondary hypercoagulable state 01/22/2020   Submucosal lesion of stomach 08/03/2020   Upper airway cough syndrome 12/15/2019   Noted on exam 12/15/2019 onset while on Entresto , resolved with purse lip    Past Surgical History:  Procedure Laterality Date   AMPUTATION Left 08/07/2022   Procedure: AMPUTATION FIFTH DIGIT;  Surgeon: Alyse Agent, MD;  Location: [facility] OR;  Service: Orthopedics;  Laterality: Left;   ATRIAL FIBRILLATION ABLATION N/A 12/25/2019   Procedure: ATRIAL FIBRILLATION ABLATION;  Surgeon: Inocencio Soyla Lunger, MD;  Location: [facility] INVASIVE CV LAB;  Service: Cardiovascular;  Laterality: N/A;   ATRIAL FIBRILLATION ABLATION N/A 07/19/2021   Procedure: ATRIAL FIBRILLATION ABLATION;  Surgeon: Inocencio Soyla Lunger, MD;  Location: [facility] INVASIVE CV LAB;  Service: Cardiovascular;  Laterality: N/A;   BIOPSY  08/11/2020   Procedure: BIOPSY;  Surgeon: Teressa Toribio SQUIBB, MD;  Location: [facility] ENDOSCOPY;  Service: Endoscopy;;   CARDIAC CATHETERIZATION     CARDIOVERSION N/A 04/17/2021   Procedure: CARDIOVERSION;  Surgeon: Mady Bruckner, MD;  Location: [facility] ORS;  Service: Cardiovascular;  Laterality: N/A;   ESOPHAGOGASTRODUODENOSCOPY (EGD) WITH PROPOFOL  N/A 08/11/2020   Procedure: ESOPHAGOGASTRODUODENOSCOPY (EGD) WITH PROPOFOL ;  Surgeon: Teressa Toribio SQUIBB, MD;  Location: [facility] ENDOSCOPY;  Service: Endoscopy;  Laterality: N/A;   I & D EXTREMITY Left 08/07/2022   Procedure: IRRIGATION AND DEBRIDEMENT;  Surgeon: Alyse Agent, MD;  Location: [facility] OR;  Service: Orthopedics;  Laterality: Left;   KNEE SURGERY     PILONIDAL CYST / SINUS EXCISION     UPPER ESOPHAGEAL ENDOSCOPIC ULTRASOUND (EUS) N/A 08/11/2020   Procedure: UPPER ESOPHAGEAL ENDOSCOPIC ULTRASOUND (EUS);  Surgeon: Teressa Toribio SQUIBB, MD;  Location: [facility];  Service: Endoscopy;  Laterality: N/A;    Current  Medications: Active Medications[1]   Allergies:   Sitagliptin, Prednisone, Victoza [liraglutide], and Sulfa antibiotics   Social History   Socioeconomic History   Marital status: Married    Spouse name: Not on file   Number of children: Not on file   Years of education: Not on file   Highest education level: Not on file  Occupational History   Not on file  Tobacco Use   Smoking status: Never   Smokeless tobacco: Never  Vaping Use   Vaping status: Never Used  Substance and Sexual Activity   Alcohol  use: Not Currently   Drug use: Never   Sexual activity: Not on file  Other Topics Concern   Not on file  Social History Narrative   Not on file   Social Drivers of Health   Tobacco Use: Low Risk (11/05/2024)   Patient History    Smoking Tobacco Use: Never    Smokeless Tobacco Use: Never    Passive Exposure: Not on file  Financial Resource Strain: Low Risk (07/11/2022)   Overall Financial Resource Strain (CARDIA)    Difficulty of Paying Living Expenses: Not hard at  all  Food Insecurity: No Food Insecurity (07/11/2022)   Hunger Vital Sign    Worried About Running Out of Food in the Last Year: Never true    Ran Out of Food in the Last Year: Never true  Transportation Needs: No Transportation Needs (07/11/2022)   PRAPARE - Administrator, Civil Service (Medical): No    Lack of Transportation (Non-Medical): No  Physical Activity: Not on file  Stress: Not on file  Social Connections: Not on file  Depression (EYV7-0): Not on file  Alcohol  Screen: Not on file  Housing: Low Risk (07/11/2022)   Housing    Last Housing Risk Score: 0  Utilities: Not At Risk (07/11/2022)   AHC Utilities    Threatened with loss of utilities: No  Health Literacy: Not on file     Family History: The patient's family history includes Colon cancer in his father; Hyperlipidemia in his father; Hypertension in his father; Liver cancer in his father; Lung cancer in his father. ROS:   Please see  the history of present illness.    All 14 point review of systems negative except as described per history of present illness.  EKGs/Labs/Other Studies Reviewed:    The following studies were reviewed today:   EKG:  EKG Interpretation Date/Time:  Thursday November 05 2024 13:42:03 EST Ventricular Rate:  63 PR Interval:  390 QRS Duration:  98 QT Interval:  404 QTC Calculation: 413 R Axis:   -3  Text Interpretation: Normal sinus rhythm First degree A-V block Septal infarct (cited on or before 13-Dec-2023) When compared with ECG of 09-Oct-2024 16:05, Current undetermined rhythm precludes rhythm comparison, needs review Confirmed by Liborio Hai reddy [phone]) on 11/05/2024 1:56:35 PM    Recent Labs: 02/15/2024: ALT 47; BUN 15; Creatinine, Ser 1.22; Hemoglobin 14.9; Platelets 202; Potassium 4.2; Sodium 137  Recent Lipid Panel    Component Value Date/Time   CHOL 135 10/09/2019 0820   TRIG 159 (H) 10/09/2019 0820   HDL 47 10/09/2019 0820   CHOLHDL 2.9 10/09/2019 0820   LDLCALC 61 10/09/2019 0820    Physical Exam:    VS:  BP 134/72   Pulse 63   Ht 6' 2 (1.88 m)   Wt 292 lb (132.5 kg)   SpO2 93%   BMI 37.49 kg/m     Wt Readings from Last 3 Encounters:  11/05/24 292 lb (132.5 kg)  10/09/24 293 lb 9.6 oz (133.2 kg)  09/14/24 290 lb (131.5 kg)     GENERAL:  Well nourished, well developed in no acute distress NECK: No JVD; No carotid bruits CARDIAC: RRR, S1 and S2 present, no murmurs, no rubs, no gallops CHEST:  Clear to auscultation without rales, wheezing or rhonchi  Extremities: No pitting pedal edema. Pulses bilaterally symmetric with radial 2+ and dorsalis pedis 2+ NEUROLOGIC:  Alert and oriented x 3  Medication Adjustments/Labs and Tests Ordered: Current medicines are reviewed at length with the patient today.  Concerns regarding medicines are outlined above.  Orders Placed This Encounter  Procedures   Ambulatory referral to Cardiac Electrophysiology   LONG  TERM MONITOR (3-14 DAYS)   EKG 12-Lead   No orders of the defined types were placed in this encounter.   Signed, Hai reddy Tonya Carlile, MD, MPH, [facility]. 11/05/2024 2:17 PM    [facility]    [1]  Current Meds  Medication Sig   albuterol  (VENTOLIN  HFA) 108 (90 Base) MCG/ACT inhaler Inhale 2 puffs into the lungs every  6 (six) hours as needed for wheezing or shortness of breath.    allopurinol (ZYLOPRIM) 100 MG tablet Take 100 mg by mouth in the morning.   apixaban  (ELIQUIS ) 5 MG TABS tablet Take 1 tablet (5 mg total) by mouth 2 (two) times daily.   Ascorbic Acid (VITAMIN C) 1000 MG tablet Take 1,000 mg by mouth 2 (two) times daily.   atorvastatin  (LIPITOR) 10 MG tablet Take 10 mg by mouth at bedtime.   budesonide-formoterol (SYMBICORT) 160-4.5 MCG/ACT inhaler Inhale 1 puff into the lungs as needed.   Cholecalciferol (VITAMIN D3) 50 MCG (2000 UT) TABS Take 2,000 Units by mouth 2 (two) times daily.    empagliflozin (JARDIANCE) 25 MG TABS tablet Take 25 mg by mouth in the morning.   Eszopiclone 3 MG TABS Take 3 mg by mouth at bedtime.   finasteride (PROSCAR) 5 MG tablet Take 5 mg by mouth daily.   flecainide  (TAMBOCOR ) 100 MG tablet Take 1 tablet (100 mg total) by mouth 2 (two) times daily.   fluticasone (FLONASE) 50 MCG/ACT nasal spray Place 2 sprays into both nostrils daily as needed for allergies or rhinitis.   furosemide  (LASIX ) 40 MG tablet Take 1 tablet (40 mg total) by mouth 2 (two) times a week.   insulin  lispro (HUMALOG) 100 UNIT/ML injection Inject 0-10 Units into the skin 3 (three) times daily as needed for high blood sugar. Sliding Scale   metoprolol  succinate (TOPROL  XL) 25 MG 24 hr tablet Take 0.5 tablets (12.5 mg total) by mouth daily.   montelukast  (SINGULAIR ) 10 MG tablet Take 10 mg by mouth at bedtime.   Omega-3 Fatty Acids (FISH OIL) 1200 MG CAPS Take 1,200 mg by mouth 2 (two) times daily.    omeprazole (PRILOSEC) 40 MG capsule Take 40 mg by mouth 2  (two) times daily.    OZEMPIC, 2 MG/DOSE, 8 MG/3ML SOPN Inject 2 mg into the skin once a week.   rOPINIRole  (REQUIP ) 4 MG tablet Take 4 mg by mouth daily.   sacubitril -valsartan  (ENTRESTO ) 49-51 MG Take 1 tablet by mouth 2 (two) times daily.   spironolactone  (ALDACTONE ) 25 MG tablet Take 1 tablet (25 mg total) by mouth daily.   tamsulosin (FLOMAX) 0.4 MG CAPS capsule Take 0.4 capsules by mouth 2 (two) times daily.   TOUJEO MAX SOLOSTAR 300 UNIT/ML Solostar Pen Inject 104 Units into the skin at bedtime.   venlafaxine XR (EFFEXOR-XR) 150 MG 24 hr capsule Take 150 mg by mouth in the morning.   Vitamin D, Ergocalciferol, (DRISDOL) 1.25 MG (50000 UT) CAPS capsule Take 50,000 Units by mouth every Tuesday. IN THE MORNING   [DISCONTINUED] atorvastatin  (LIPITOR) 20 MG tablet Take 20 mg by mouth daily.   "

## 2024-11-05 NOTE — Assessment & Plan Note (Signed)
 Good functional status. Appears symptomatic for breakthrough paroxysmal A-fib episodes. Continue Eliquis  and atorvastatin  as reviewed above.

## 2024-11-05 NOTE — Assessment & Plan Note (Signed)
 Recovered LVEF, was 35 to 40% in November 2019. Last echocardiogram January 2025 with EF 60 to 65%, normal RV function.   Remains euvolemic and compensated. Continue with salt restriction less than 2 g/day.  On guideline directed medical therapy on low-dose metoprolol  XL 12.5 mg once daily Entresto  49-51 mg twice daily Spironolactone  25 mg once daily Jardiance on diabetic dose.

## 2024-11-05 NOTE — Patient Instructions (Signed)
 Medication Instructions:  Your physician recommends that you continue on your current medications as directed. Please refer to the Current Medication list given to you today.  *If you need a refill on your cardiac medications before your next appointment, please call your pharmacy*   Lab Work: None Ordered If you have labs (blood work) drawn today and your tests are completely normal, you will receive your results only by: MyChart Message (if you have MyChart) OR A paper copy in the mail If you have any lab test that is abnormal or we need to change your treatment, we will call you to review the results.   Testing/Procedures: None Ordered   Follow-Up: At [facility], you and your health needs are our priority.  As part of our continuing mission to provide you with exceptional heart care, we have created designated Provider Care Teams.  These Care Teams include your primary Cardiologist (physician) and Advanced Practice Providers (APPs -  Physician Assistants and Nurse Practitioners) who all work together to provide you with the care you need, when you need it.  We recommend signing up for the patient portal called MyChart.  Sign up information is provided on this After Visit Summary.  MyChart is used to connect with patients for Virtual Visits (Telemedicine).  Patients are able to view lab/test results, encounter notes, upcoming appointments, etc.  Non-urgent messages can be sent to your provider as well.   To learn more about what you can do with MyChart, go to [email or web address].    Your next appointment:   3-4 weeks with Dr. Inocencio  The format for your next appointment:   In Person  Provider:   Alean Kobus, MD   Other Instructions NA

## 2024-11-05 NOTE — Assessment & Plan Note (Signed)
 Continue atorvastatin  20 mg once daily. Last lipid panel 07/02/2024.

## 2024-11-05 NOTE — Assessment & Plan Note (Signed)
 History of ablation December 2021 and redo ablation September 2022. Follows up with Dr. Inocencio and EP for rhythm control. Remains on rhythm control with flecainide  100 mg twice daily plus low-dose metoprolol  succinate 12.5 mg once daily.  EKG continues to show sinus rhythm with markedly prolonged PR interval. Narrow QRS.  Had 3 tracing showing A-fib on his Apple smart watch in the past month with well-controlled heart rates.  With breakthrough episodes, will obtain a Zio patch for 14 days to to assess overall burden of A-fib.  Continue flecainide  and metoprolol  at current doses. If he has significant breakthrough episodes of A-fib, we will have an follow-up with Dr. Inocencio with regards to alternative options for rhythm control such as Tikosyn or sotalol.  CHA2DS2-VASc score 5 Continue anticoagulation with Eliquis  5 mg twice daily.

## 2024-11-27 ENCOUNTER — Other Ambulatory Visit: Payer: Self-pay | Admitting: *Deleted

## 2024-11-27 DIAGNOSIS — I480 Paroxysmal atrial fibrillation: Secondary | ICD-10-CM

## 2024-11-27 MED ORDER — APIXABAN 5 MG PO TABS: 5.0000 mg | ORAL_TABLET | Freq: Two times a day (BID) | ORAL | 1 refills | Status: AC

## 2024-11-27 NOTE — Telephone Encounter (Signed)
 Eliquis  5mg  refill request received. Patient is 74 years old, weight-132.5kg, Crea-1.22 on 02/15/24, Diagnosis-Afib, and last seen by Dr. Liborio on 11/05/24. Dose is appropriate based on dosing criteria. Will send in refill to requested pharmacy.

## 2024-11-30 ENCOUNTER — Ambulatory Visit: Admitting: Cardiology

## 2024-12-01 ENCOUNTER — Ambulatory Visit: Payer: Self-pay

## 2024-12-01 DIAGNOSIS — I4819 Other persistent atrial fibrillation: Secondary | ICD-10-CM | POA: Diagnosis not present

## 2024-12-14 ENCOUNTER — Ambulatory Visit: Admitting: Cardiology
